# Patient Record
Sex: Male | Born: 1955 | Race: Black or African American | Hispanic: No | Marital: Single | Smoking: Current some day smoker
Health system: Southern US, Community
[De-identification: ages and names within clinical notes are randomized; demographics above are authoritative.]

## PROBLEM LIST (undated history)

## (undated) DIAGNOSIS — F1010 Alcohol abuse, uncomplicated: Secondary | ICD-10-CM

## (undated) DIAGNOSIS — E785 Hyperlipidemia, unspecified: Secondary | ICD-10-CM

## (undated) DIAGNOSIS — I480 Paroxysmal atrial fibrillation: Secondary | ICD-10-CM

## (undated) DIAGNOSIS — I5020 Unspecified systolic (congestive) heart failure: Secondary | ICD-10-CM

## (undated) DIAGNOSIS — Z720 Tobacco use: Secondary | ICD-10-CM

## (undated) DIAGNOSIS — I10 Essential (primary) hypertension: Secondary | ICD-10-CM

## (undated) DIAGNOSIS — I429 Cardiomyopathy, unspecified: Secondary | ICD-10-CM

## (undated) HISTORY — PX: NO PAST SURGERIES: SHX2092

---

## 2018-10-10 ENCOUNTER — Other Ambulatory Visit: Payer: Self-pay

## 2018-10-10 DIAGNOSIS — Z20822 Contact with and (suspected) exposure to covid-19: Secondary | ICD-10-CM

## 2018-10-12 LAB — NOVEL CORONAVIRUS, NAA: SARS-CoV-2, NAA: NOT DETECTED

## 2021-02-04 ENCOUNTER — Inpatient Hospital Stay
Admission: EM | Admit: 2021-02-04 | Discharge: 2021-02-10 | DRG: 871 | Disposition: A | Discharge status: Home / Self Care | Payer: Self-pay | Attending: Internal Medicine | Admitting: Internal Medicine

## 2021-02-04 ENCOUNTER — Inpatient Hospital Stay (HOSPITAL_COMMUNITY)
Admit: 2021-02-04 | Discharge: 2021-02-04 | Disposition: A | Discharge status: Home / Self Care | Payer: Self-pay | Attending: Internal Medicine | Admitting: Internal Medicine

## 2021-02-04 ENCOUNTER — Emergency Department: Payer: Self-pay

## 2021-02-04 ENCOUNTER — Other Ambulatory Visit: Payer: Self-pay

## 2021-02-04 DIAGNOSIS — R609 Edema, unspecified: Secondary | ICD-10-CM | POA: Diagnosis present

## 2021-02-04 DIAGNOSIS — R112 Nausea with vomiting, unspecified: Secondary | ICD-10-CM | POA: Diagnosis present

## 2021-02-04 DIAGNOSIS — L8990 Pressure ulcer of unspecified site, unspecified stage: Secondary | ICD-10-CM | POA: Diagnosis present

## 2021-02-04 DIAGNOSIS — D649 Anemia, unspecified: Secondary | ICD-10-CM | POA: Diagnosis present

## 2021-02-04 DIAGNOSIS — E8721 Acute metabolic acidosis: Secondary | ICD-10-CM | POA: Diagnosis present

## 2021-02-04 DIAGNOSIS — E785 Hyperlipidemia, unspecified: Secondary | ICD-10-CM | POA: Diagnosis present

## 2021-02-04 DIAGNOSIS — A419 Sepsis, unspecified organism: Principal | ICD-10-CM

## 2021-02-04 DIAGNOSIS — D638 Anemia in other chronic diseases classified elsewhere: Secondary | ICD-10-CM | POA: Diagnosis present

## 2021-02-04 DIAGNOSIS — I959 Hypotension, unspecified: Secondary | ICD-10-CM | POA: Diagnosis present

## 2021-02-04 DIAGNOSIS — I480 Paroxysmal atrial fibrillation: Secondary | ICD-10-CM | POA: Diagnosis present

## 2021-02-04 DIAGNOSIS — N529 Male erectile dysfunction, unspecified: Secondary | ICD-10-CM | POA: Diagnosis present

## 2021-02-04 DIAGNOSIS — I444 Left anterior fascicular block: Secondary | ICD-10-CM | POA: Diagnosis present

## 2021-02-04 DIAGNOSIS — Z789 Other specified health status: Secondary | ICD-10-CM

## 2021-02-04 DIAGNOSIS — Z833 Family history of diabetes mellitus: Secondary | ICD-10-CM

## 2021-02-04 DIAGNOSIS — R64 Cachexia: Secondary | ICD-10-CM | POA: Diagnosis not present

## 2021-02-04 DIAGNOSIS — E8729 Other acidosis: Secondary | ICD-10-CM

## 2021-02-04 DIAGNOSIS — L89152 Pressure ulcer of sacral region, stage 2: Secondary | ICD-10-CM | POA: Diagnosis present

## 2021-02-04 DIAGNOSIS — J189 Pneumonia, unspecified organism: Secondary | ICD-10-CM

## 2021-02-04 DIAGNOSIS — F17210 Nicotine dependence, cigarettes, uncomplicated: Secondary | ICD-10-CM | POA: Diagnosis present

## 2021-02-04 DIAGNOSIS — E876 Hypokalemia: Secondary | ICD-10-CM | POA: Diagnosis present

## 2021-02-04 DIAGNOSIS — Z20822 Contact with and (suspected) exposure to covid-19: Secondary | ICD-10-CM | POA: Diagnosis present

## 2021-02-04 DIAGNOSIS — M899 Disorder of bone, unspecified: Secondary | ICD-10-CM | POA: Diagnosis present

## 2021-02-04 DIAGNOSIS — I110 Hypertensive heart disease with heart failure: Secondary | ICD-10-CM | POA: Diagnosis present

## 2021-02-04 DIAGNOSIS — I10 Essential (primary) hypertension: Secondary | ICD-10-CM

## 2021-02-04 DIAGNOSIS — R636 Underweight: Secondary | ICD-10-CM

## 2021-02-04 DIAGNOSIS — R739 Hyperglycemia, unspecified: Secondary | ICD-10-CM | POA: Diagnosis present

## 2021-02-04 DIAGNOSIS — E872 Acidosis, unspecified: Secondary | ICD-10-CM

## 2021-02-04 DIAGNOSIS — I5022 Chronic systolic (congestive) heart failure: Secondary | ICD-10-CM | POA: Diagnosis present

## 2021-02-04 DIAGNOSIS — F1020 Alcohol dependence, uncomplicated: Secondary | ICD-10-CM | POA: Diagnosis present

## 2021-02-04 DIAGNOSIS — J690 Pneumonitis due to inhalation of food and vomit: Secondary | ICD-10-CM | POA: Diagnosis present

## 2021-02-04 DIAGNOSIS — E538 Deficiency of other specified B group vitamins: Secondary | ICD-10-CM | POA: Diagnosis not present

## 2021-02-04 DIAGNOSIS — Z681 Body mass index (BMI) 19 or less, adult: Secondary | ICD-10-CM

## 2021-02-04 DIAGNOSIS — I429 Cardiomyopathy, unspecified: Secondary | ICD-10-CM | POA: Diagnosis present

## 2021-02-04 DIAGNOSIS — R197 Diarrhea, unspecified: Secondary | ICD-10-CM

## 2021-02-04 DIAGNOSIS — R6520 Severe sepsis without septic shock: Secondary | ICD-10-CM

## 2021-02-04 DIAGNOSIS — R531 Weakness: Secondary | ICD-10-CM

## 2021-02-04 DIAGNOSIS — E861 Hypovolemia: Secondary | ICD-10-CM | POA: Diagnosis present

## 2021-02-04 DIAGNOSIS — R7989 Other specified abnormal findings of blood chemistry: Secondary | ICD-10-CM

## 2021-02-04 DIAGNOSIS — E7800 Pure hypercholesterolemia, unspecified: Secondary | ICD-10-CM | POA: Diagnosis present

## 2021-02-04 DIAGNOSIS — I4510 Unspecified right bundle-branch block: Secondary | ICD-10-CM | POA: Diagnosis present

## 2021-02-04 DIAGNOSIS — I4891 Unspecified atrial fibrillation: Secondary | ICD-10-CM

## 2021-02-04 DIAGNOSIS — E43 Unspecified severe protein-calorie malnutrition: Secondary | ICD-10-CM | POA: Diagnosis present

## 2021-02-04 DIAGNOSIS — R000 Tachycardia, unspecified: Secondary | ICD-10-CM | POA: Diagnosis present

## 2021-02-04 HISTORY — DX: Tobacco use: Z72.0

## 2021-02-04 HISTORY — DX: Cardiomyopathy, unspecified: I42.9

## 2021-02-04 HISTORY — DX: Alcohol abuse, uncomplicated: F10.10

## 2021-02-04 HISTORY — DX: Hyperlipidemia, unspecified: E78.5

## 2021-02-04 HISTORY — DX: Essential (primary) hypertension: I10

## 2021-02-04 LAB — ACETAMINOPHEN LEVEL: Acetaminophen (Tylenol), Serum: <10 ug/mL — ABNORMAL LOW (ref 10–30)

## 2021-02-04 LAB — BASIC METABOLIC PANEL
Anion gap: 29 — ABNORMAL HIGH (ref 5–15)
Anion gap: 18 — ABNORMAL HIGH (ref 5–15)
BUN: 9 mg/dL (ref 8–23)
BUN: 7 mg/dL — ABNORMAL LOW (ref 8–23)
CO2: 10 mmol/L — ABNORMAL LOW (ref 22–32)
CO2: 14 mmol/L — ABNORMAL LOW (ref 22–32)
Calcium: 9 mg/dL (ref 8.9–10.3)
Calcium: 8.3 mg/dL — ABNORMAL LOW (ref 8.9–10.3)
Chloride: 105 mmol/L (ref 98–111)
Chloride: 106 mmol/L (ref 98–111)
Creatinine, Ser: 0.94 mg/dL (ref 0.61–1.24)
Creatinine, Ser: 0.97 mg/dL (ref 0.61–1.24)
GFR, Estimated: >60 mL/min (ref >60)
GFR, Estimated: >60 mL/min (ref >60)
Glucose, Bld: 128 mg/dL — ABNORMAL HIGH (ref 70–99)
Glucose, Bld: 288 mg/dL — ABNORMAL HIGH (ref 70–99)
Potassium: 4.2 mmol/L (ref 3.5–5.1)
Potassium: 3.9 mmol/L (ref 3.5–5.1)
Sodium: 144 mmol/L (ref 135–145)
Sodium: 138 mmol/L (ref 135–145)

## 2021-02-04 LAB — CBC
HCT: 32 % — ABNORMAL LOW (ref 39.0–52.0)
Hemoglobin: 9.5 g/dL — ABNORMAL LOW (ref 13.0–17.0)
MCH: 32.1 pg (ref 26.0–34.0)
MCHC: 29.7 g/dL — ABNORMAL LOW (ref 30.0–36.0)
MCV: 108.1 fL — ABNORMAL HIGH (ref 80.0–100.0)
Platelets: 398 10*3/uL (ref 150–400)
RBC: 2.96 MIL/uL — ABNORMAL LOW (ref 4.22–5.81)
RDW: 16.2 % — ABNORMAL HIGH (ref 11.5–15.5)
WBC: 11 10*3/uL — ABNORMAL HIGH (ref 4.0–10.5)
nRBC: 0 % (ref 0.0–0.2)

## 2021-02-04 LAB — URINALYSIS, ROUTINE W REFLEX MICROSCOPIC
Bilirubin Urine: NEGATIVE
Glucose, UA: NEGATIVE mg/dL
Hgb urine dipstick: NEGATIVE
Ketones, ur: 80 mg/dL — AB
Leukocytes,Ua: NEGATIVE
Nitrite: NEGATIVE
Protein, ur: NEGATIVE mg/dL
Specific Gravity, Urine: 1.034 — ABNORMAL HIGH (ref 1.005–1.030)
pH: 6 (ref 5.0–8.0)

## 2021-02-04 LAB — URINE DRUG SCREEN, QUALITATIVE (ARMC ONLY)
Amphetamines, Ur Screen: NOT DETECTED
Barbiturates, Ur Screen: NOT DETECTED
Benzodiazepine, Ur Scrn: NOT DETECTED
Cannabinoid 50 Ng, Ur ~~LOC~~: NOT DETECTED
Cocaine Metabolite,Ur ~~LOC~~: NOT DETECTED
MDMA (Ecstasy)Ur Screen: NOT DETECTED
Methadone Scn, Ur: NOT DETECTED
Opiate, Ur Screen: NOT DETECTED
Phencyclidine (PCP) Ur S: NOT DETECTED
Tricyclic, Ur Screen: NOT DETECTED

## 2021-02-04 LAB — CBC WITH DIFFERENTIAL/PLATELET
Abs Immature Granulocytes: 0.05 10*3/uL (ref 0.00–0.07)
Basophils Absolute: 0 10*3/uL (ref 0.0–0.1)
Basophils Relative: 0 %
Eosinophils Absolute: 0 10*3/uL (ref 0.0–0.5)
Eosinophils Relative: 0 %
HCT: 31.5 % — ABNORMAL LOW (ref 39.0–52.0)
Hemoglobin: 9.7 g/dL — ABNORMAL LOW (ref 13.0–17.0)
Immature Granulocytes: 1 %
Lymphocytes Relative: 5 %
Lymphs Abs: 0.5 10*3/uL — ABNORMAL LOW (ref 0.7–4.0)
MCH: 33.3 pg (ref 26.0–34.0)
MCHC: 30.8 g/dL (ref 30.0–36.0)
MCV: 108.2 fL — ABNORMAL HIGH (ref 80.0–100.0)
Monocytes Absolute: 0.2 10*3/uL (ref 0.1–1.0)
Monocytes Relative: 2 %
Neutro Abs: 9.7 10*3/uL — ABNORMAL HIGH (ref 1.7–7.7)
Neutrophils Relative %: 92 %
Platelets: 409 10*3/uL — ABNORMAL HIGH (ref 150–400)
RBC: 2.91 MIL/uL — ABNORMAL LOW (ref 4.22–5.81)
RDW: 16.3 % — ABNORMAL HIGH (ref 11.5–15.5)
WBC: 10.5 10*3/uL (ref 4.0–10.5)
nRBC: 0 % (ref 0.0–0.2)

## 2021-02-04 LAB — BLOOD GAS, VENOUS
Acid-base deficit: 18.8 mmol/L — ABNORMAL HIGH (ref 0.0–2.0)
Bicarbonate: 9.1 mmol/L — ABNORMAL LOW (ref 20.0–28.0)
O2 Saturation: 72 %
Patient temperature: 37
pCO2, Ven: 28 mmHg — ABNORMAL LOW (ref 44.0–60.0)
pH, Ven: 7.12 — CL (ref 7.250–7.430)
pO2, Ven: 52 mmHg — ABNORMAL HIGH (ref 32.0–45.0)

## 2021-02-04 LAB — IRON AND TIBC
Iron: 110 ug/dL (ref 45–182)
Saturation Ratios: 103 % — ABNORMAL HIGH (ref 17.9–39.5)
TIBC: 106 ug/dL — ABNORMAL LOW (ref 250–450)

## 2021-02-04 LAB — HEPATIC FUNCTION PANEL
ALT: 26 U/L (ref 0–44)
AST: 54 U/L — ABNORMAL HIGH (ref 15–41)
Albumin: 2.7 g/dL — ABNORMAL LOW (ref 3.5–5.0)
Alkaline Phosphatase: 116 U/L (ref 38–126)
Bilirubin, Direct: 0.4 mg/dL — ABNORMAL HIGH (ref 0.0–0.2)
Indirect Bilirubin: 2 mg/dL — ABNORMAL HIGH (ref 0.3–0.9)
Total Bilirubin: 2.4 mg/dL — ABNORMAL HIGH (ref 0.3–1.2)
Total Protein: 8.5 g/dL — ABNORMAL HIGH (ref 6.5–8.1)

## 2021-02-04 LAB — RESP PANEL BY RT-PCR (FLU A&B, COVID) ARPGX2
Influenza A by PCR: NEGATIVE
Influenza B by PCR: NEGATIVE
SARS Coronavirus 2 by RT PCR: NEGATIVE

## 2021-02-04 LAB — LACTIC ACID, PLASMA
Lactic Acid, Venous: 4.9 mmol/L (ref 0.5–1.9)
Lactic Acid, Venous: 6.3 mmol/L (ref 0.5–1.9)
Lactic Acid, Venous: 1.5 mmol/L (ref 0.5–1.9)

## 2021-02-04 LAB — ETHANOL: Alcohol, Ethyl (B): <10 mg/dL (ref <10)

## 2021-02-04 LAB — HIV ANTIBODY (ROUTINE TESTING W REFLEX): HIV Screen 4th Generation wRfx: NONREACTIVE

## 2021-02-04 LAB — SALICYLATE LEVEL: Salicylate Lvl: <7 mg/dL — ABNORMAL LOW (ref 7.0–30.0)

## 2021-02-04 LAB — PHOSPHORUS: Phosphorus: 4.1 mg/dL (ref 2.5–4.6)

## 2021-02-04 LAB — PSA: Prostatic Specific Antigen: 1.72 ng/mL (ref 0.00–4.00)

## 2021-02-04 LAB — BETA-HYDROXYBUTYRIC ACID: Beta-Hydroxybutyric Acid: >8 mmol/L — ABNORMAL HIGH (ref 0.05–0.27)

## 2021-02-04 LAB — CBG MONITORING, ED: Glucose-Capillary: 144 mg/dL — ABNORMAL HIGH (ref 70–99)

## 2021-02-04 LAB — MAGNESIUM: Magnesium: 1.7 mg/dL (ref 1.7–2.4)

## 2021-02-04 LAB — LIPASE, BLOOD: Lipase: 27 U/L (ref 11–51)

## 2021-02-04 MED ORDER — ENOXAPARIN SODIUM 40 MG/0.4ML IJ SOSY
40.0000 mg | PREFILLED_SYRINGE | INTRAMUSCULAR | Status: DC
  Administered 2021-02-04 – 2021-02-05 (×2): 40 mg via SUBCUTANEOUS
  Filled 2021-02-04 (×2): qty 0.4

## 2021-02-04 MED ORDER — THIAMINE HCL 100 MG PO TABS
100.0000 mg | ORAL_TABLET | Freq: Every day | ORAL | Status: DC
  Administered 2021-02-05 – 2021-02-10 (×6): 100 mg via ORAL
  Filled 2021-02-04 (×7): qty 1

## 2021-02-04 MED ORDER — SODIUM CHLORIDE 0.9 % IV SOLN
2.0000 g | INTRAVENOUS | Status: AC
  Administered 2021-02-04 – 2021-02-08 (×5): 2 g via INTRAVENOUS
  Filled 2021-02-04 (×2): qty 20
  Filled 2021-02-04: qty 2
  Filled 2021-02-04: qty 20
  Filled 2021-02-04: qty 2

## 2021-02-04 MED ORDER — FOLIC ACID 1 MG PO TABS
1.0000 mg | ORAL_TABLET | Freq: Every day | ORAL | Status: DC
  Administered 2021-02-04 – 2021-02-10 (×7): 1 mg via ORAL
  Filled 2021-02-04 (×7): qty 1

## 2021-02-04 MED ORDER — THIAMINE HCL 100 MG PO TABS: 100.0000 mg | ORAL_TABLET | Freq: Every day | ORAL | Status: DC

## 2021-02-04 MED ORDER — ACETAMINOPHEN 325 MG PO TABS
650.0000 mg | ORAL_TABLET | Freq: Four times a day (QID) | ORAL | Status: DC | PRN
  Administered 2021-02-05: 09:00:00 650 mg via ORAL
  Filled 2021-02-04 (×2): qty 2

## 2021-02-04 MED ORDER — MAGNESIUM SULFATE 2 GM/50ML IV SOLN
2.0000 g | Freq: Once | INTRAVENOUS | Status: AC
  Administered 2021-02-04: 13:00:00 2 g via INTRAVENOUS
  Filled 2021-02-04: qty 50

## 2021-02-04 MED ORDER — SODIUM CHLORIDE 0.9 % IV SOLN
500.0000 mg | INTRAVENOUS | Status: AC
  Administered 2021-02-04 – 2021-02-08 (×5): 500 mg via INTRAVENOUS
  Filled 2021-02-04: qty 5
  Filled 2021-02-04 (×2): qty 500
  Filled 2021-02-04: qty 5
  Filled 2021-02-04: qty 500

## 2021-02-04 MED ORDER — SODIUM CHLORIDE 0.9 % IV BOLUS
1000.0000 mL | Freq: Once | INTRAVENOUS | Status: AC
  Administered 2021-02-04: 08:00:00 1000 mL via INTRAVENOUS

## 2021-02-04 MED ORDER — LORAZEPAM 2 MG PO TABS: 0.0000 mg | ORAL_TABLET | Freq: Two times a day (BID) | ORAL | Status: DC

## 2021-02-04 MED ORDER — LORAZEPAM 2 MG PO TABS: 0.0000 mg | ORAL_TABLET | Freq: Four times a day (QID) | ORAL | Status: DC

## 2021-02-04 MED ORDER — LORAZEPAM 2 MG/ML IJ SOLN
0.0000 mg | Freq: Four times a day (QID) | INTRAMUSCULAR | Status: DC
  Administered 2021-02-04: 09:00:00 2 mg via INTRAVENOUS
  Filled 2021-02-04: qty 1

## 2021-02-04 MED ORDER — IOHEXOL 300 MG/ML  SOLN
100.0000 mL | Freq: Once | INTRAMUSCULAR | Status: AC | PRN
  Administered 2021-02-04: 09:00:00 100 mL via INTRAVENOUS

## 2021-02-04 MED ORDER — ACETAMINOPHEN 650 MG RE SUPP: 650.0000 mg | Freq: Four times a day (QID) | RECTAL | Status: DC | PRN

## 2021-02-04 MED ORDER — LORAZEPAM 2 MG/ML IJ SOLN: 1.0000 mg | INTRAMUSCULAR | Status: AC | PRN

## 2021-02-04 MED ORDER — LACTATED RINGERS IV BOLUS (SEPSIS): 1000.0000 mL | Freq: Once | INTRAVENOUS | Status: DC

## 2021-02-04 MED ORDER — DILTIAZEM HCL 25 MG/5ML IV SOLN
10.0000 mg | Freq: Once | INTRAVENOUS | Status: AC
  Administered 2021-02-04: 11:00:00 10 mg via INTRAVENOUS
  Filled 2021-02-04: qty 5

## 2021-02-04 MED ORDER — LACTATED RINGERS IV SOLN: INTRAVENOUS | Status: DC

## 2021-02-04 MED ORDER — LORAZEPAM 1 MG PO TABS: 1.0000 mg | ORAL_TABLET | ORAL | Status: DC | PRN

## 2021-02-04 MED ORDER — LACTATED RINGERS IV BOLUS
1000.0000 mL | Freq: Once | INTRAVENOUS | Status: AC
  Administered 2021-02-04: 12:00:00 1000 mL via INTRAVENOUS

## 2021-02-04 MED ORDER — METOPROLOL TARTRATE 25 MG PO TABS
12.5000 mg | ORAL_TABLET | Freq: Two times a day (BID) | ORAL | Status: DC
  Administered 2021-02-04 – 2021-02-07 (×6): 12.5 mg via ORAL
  Filled 2021-02-04 (×6): qty 1

## 2021-02-04 MED ORDER — LORAZEPAM 2 MG/ML IJ SOLN: 1.0000 mg | INTRAMUSCULAR | Status: DC | PRN

## 2021-02-04 MED ORDER — ADULT MULTIVITAMIN W/MINERALS CH
1.0000 | ORAL_TABLET | Freq: Every day | ORAL | Status: DC
  Administered 2021-02-04 – 2021-02-10 (×7): 1 via ORAL
  Filled 2021-02-04 (×8): qty 1

## 2021-02-04 MED ORDER — DILTIAZEM HCL-DEXTROSE 125-5 MG/125ML-% IV SOLN (PREMIX)
5.0000 mg/h | INTRAVENOUS | Status: DC
Start: 2021-02-04 — End: 2021-02-04
  Filled 2021-02-04: qty 125

## 2021-02-04 MED ORDER — LORAZEPAM 2 MG/ML IJ SOLN: 0.0000 mg | Freq: Two times a day (BID) | INTRAMUSCULAR | Status: DC

## 2021-02-04 MED ORDER — ONDANSETRON HCL 4 MG PO TABS: 4.0000 mg | ORAL_TABLET | Freq: Four times a day (QID) | ORAL | Status: DC | PRN

## 2021-02-04 MED ORDER — THIAMINE HCL 100 MG/ML IJ SOLN
100.0000 mg | Freq: Every day | INTRAMUSCULAR | Status: DC
  Administered 2021-02-04: 11:00:00 100 mg via INTRAVENOUS

## 2021-02-04 MED ORDER — ONDANSETRON HCL 4 MG/2ML IJ SOLN
4.0000 mg | Freq: Once | INTRAMUSCULAR | Status: AC
  Administered 2021-02-04: 09:00:00 4 mg via INTRAVENOUS
  Filled 2021-02-04: qty 2

## 2021-02-04 MED ORDER — METOPROLOL TARTRATE 25 MG PO TABS
25.0000 mg | ORAL_TABLET | Freq: Two times a day (BID) | ORAL | Status: DC
  Administered 2021-02-04: 13:00:00 25 mg via ORAL
  Filled 2021-02-04: qty 1

## 2021-02-04 MED ORDER — LORAZEPAM 2 MG/ML IJ SOLN: 0.0000 mg | Freq: Four times a day (QID) | INTRAMUSCULAR | Status: DC

## 2021-02-04 MED ORDER — ONDANSETRON HCL 4 MG/2ML IJ SOLN
4.0000 mg | Freq: Four times a day (QID) | INTRAMUSCULAR | Status: DC | PRN
  Administered 2021-02-05: 05:00:00 4 mg via INTRAVENOUS
  Filled 2021-02-04: qty 2

## 2021-02-04 MED ORDER — LORAZEPAM 1 MG PO TABS: 1.0000 mg | ORAL_TABLET | ORAL | Status: AC | PRN

## 2021-02-04 MED ORDER — THIAMINE HCL 100 MG/ML IJ SOLN
100.0000 mg | Freq: Every day | INTRAMUSCULAR | Status: DC
  Filled 2021-02-04: qty 2

## 2021-02-04 MED ORDER — DEXTROSE-NACL 5-0.9 % IV SOLN: INTRAVENOUS | Status: DC

## 2021-02-04 NOTE — ED Notes (Signed)
Lab at bedside collecting lactic and Blood Cultures

## 2021-02-04 NOTE — Progress Notes (Addendum)
Notified provider and bedside nurse of need to order blood cultures.   Only one blood culture ordered. MD responded will order a second. However pt is a difficult stick and was barely able to get first blood culture per bedside RN

## 2021-02-04 NOTE — Progress Notes (Signed)
Elink following code sepsis °

## 2021-02-04 NOTE — ED Notes (Signed)
This RN called RT at this time to notify of VBG in lab

## 2021-02-04 NOTE — Progress Notes (Signed)
CODE SEPSIS - PHARMACY COMMUNICATION  **Broad Spectrum Antibiotics should be administered within 1 hour of Sepsis diagnosis**  Time Code Sepsis Called/Page Received: 1037  Antibiotics Ordered: ceftriaxone and azithromycin  Time of 1st antibiotic administration: 1100   Raiford Noble ,PharmD Clinical Pharmacist  02/04/2021  10:49 AM \

## 2021-02-04 NOTE — ED Triage Notes (Signed)
Presents via EMS from home with some weakness  he denies any n/v or fever  states he may have pneumonia or going thur DT's  states he has been on a bender for several days  last drink was 2 days ago

## 2021-02-04 NOTE — ED Notes (Signed)
This RN entered room d/t pt calling out. This pt asked if they were dead, this RN informed them they were not dead. This pt stated they felt dead and did not feel better yet after the medication, this RN explained to this pt that it would take time for them to feel better and that it would not happen immediately.

## 2021-02-04 NOTE — ED Notes (Signed)
This RN called lab at this time to request they draw this pt's BMP and lactic acid, pt a difficult stick and lines do not draw back. They stated they would send someone shortly.

## 2021-02-04 NOTE — ED Notes (Signed)
Pt shown where callbell is again, pt given water to drink and covered back up with blankets. Bed in lowest position, call bell within reach, side rails up.

## 2021-02-04 NOTE — ED Notes (Signed)
This RN called lab at this time to request they send someone to get this pt's 1300 lactic. They stated they would send someone.

## 2021-02-04 NOTE — ED Notes (Signed)
Warmed fluids administered at this time

## 2021-02-04 NOTE — ED Notes (Signed)
This RN went into this pt's room d/t them calling out, pt informed this RN that they still felt bad and asked this RN why the medication hasn't made them better yet. This RN explained to this pt that it would take time for them to feel better since they were very sick, this pt did not seem to understand, this RN again explained that the medication doesn't immediately make them feel better and that it would take time. Pt stated "I want to feel better now". This RN expressed understanding and asked if there was anything else they could do. Pt stated no.

## 2021-02-04 NOTE — ED Notes (Signed)
ED TO INPATIENT HANDOFF REPORT  ED Nurse Name and Phone #:   S Name/Age/Gender Charles Schmitt. 65 y.o. male Room/Bed: ED07A/ED07A  Code Status   Code Status: Full Code  Home/SNF/Other Home Patient oriented to: self, place, time, and situation Is this baseline? 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.29]  Triage Note Presents via EMS from home with some weakness  he denies any n/v or fever  states he may have pneumonia or going thur DT's  states he has been on a bender for several days  last drink was 2 days ago   Allergies No Known Allergies  Level of Care/Admitting Diagnosis ED Disposition     ED Disposition  Admit   Condition  --   Comment  Hospital Area: [facility] [[id number]]  Level of Care: Progressive [102]  Admit to Progressive based on following criteria: CARDIOVASCULAR & THORACIC of moderate stability with acute coronary syndrome symptoms/low risk myocardial infarction/hypertensive urgency/arrhythmias/heart failure potentially compromising stability and stable post cardiovascular intervention patients.  Covid Evaluation: Confirmed COVID Negative  Diagnosis: Alcoholic ketoacidosis [172225]  Admitting Physician: Alford Highland [[id number]]  Attending Physician: Alford Highland [[id number]]  Estimated length of stay: past midnight tomorrow  Certification:: I certify this patient will need inpatient services for at least 2 midnights          B Medical/Surgery History Past Medical History:  Diagnosis Date   Hyperlipidemia    Hypertension    Past Surgical History:  Procedure Laterality Date   NO PAST SURGERIES       A IV Location/Drains/Wounds Patient Lines/Drains/Airways Status     Active Line/Drains/Airways     Name Placement date Placement time Site Days   Peripheral IV 02/04/21 20 G Left Antecubital 02/04/21  0825  Antecubital  less than 1   Peripheral IV 02/04/21 22 G 1.25"  Anterior;Distal;Right;Upper Arm 02/04/21  1113  Arm  less than 1   Peripheral IV 02/04/21 22 G 1.25" Anterior;Left Forearm 02/04/21  1130  Forearm  less than 1            Intake/Output Last 24 hours No intake or output data in the 24 hours ending 02/04/21 2142  Labs/Imaging Results for orders placed or performed during the hospital encounter of 02/04/21 (from the past 48 hour(s))  Basic metabolic panel     Status: Abnormal   Collection Time: 02/04/21  7:31 AM  Result Value Ref Range   Sodium 144 135 - 145 mmol/L    Comment: ELECTROLYTES REPEATED TO VERIFY DAS   Potassium 4.2 3.5 - 5.1 mmol/L   Chloride 105 98 - 111 mmol/L   CO2 10 (L) 22 - 32 mmol/L   Glucose, Bld 128 (H) 70 - 99 mg/dL    Comment: Glucose reference range applies only to samples taken after fasting for at least 8 hours.   BUN 9 8 - 23 mg/dL   Creatinine, Ser 8.11 0.61 - 1.24 mg/dL   Calcium 9.0 8.9 - 91.4 mg/dL   GFR, Estimated >78 >29 mL/min    Comment: (NOTE) Calculated using the CKD-EPI Creatinine Equation (2021)    Anion gap 29 (H) 5 - 15    Comment: Performed at [facility], [address] [place], [place] [address]  CBC     Status: Abnormal   Collection Time: 02/04/21  7:31 AM  Result Value Ref Range   WBC 11.0 (H) 4.0 - 10.5 K/uL   RBC 2.96 (L) 4.22 -  5.81 MIL/uL   Hemoglobin 9.5 (L) 13.0 - 17.0 g/dL   HCT 35.5 (L) 97.4 - 16.3 %   MCV 108.1 (H) 80.0 - 100.0 fL   MCH 32.1 26.0 - 34.0 pg   MCHC 29.7 (L) 30.0 - 36.0 g/dL   RDW 84.5 (H) 36.4 - 68.0 %   Platelets 398 150 - 400 K/uL   nRBC 0.0 0.0 - 0.2 %    Comment: Performed at [facility], [address] [place], [place] [address]  Ethanol     Status: None   Collection Time: 02/04/21  7:31 AM  Result Value Ref Range   Alcohol, Ethyl (B) <10 <10 mg/dL    Comment: (NOTE) Lowest detectable limit for serum alcohol is 10 mg/dL.  For medical purposes only. Performed at [facility], [address]  [place], [place] [address]   Hepatic function panel     Status: Abnormal   Collection Time: 02/04/21  7:31 AM  Result Value Ref Range   Total Protein 8.5 (H) 6.5 - 8.1 g/dL   Albumin 2.7 (L) 3.5 - 5.0 g/dL   AST 54 (H) 15 - 41 U/L   ALT 26 0 - 44 U/L   Alkaline Phosphatase 116 38 - 126 U/L   Total Bilirubin 2.4 (H) 0.3 - 1.2 mg/dL   Bilirubin, Direct 0.4 (H) 0.0 - 0.2 mg/dL   Indirect Bilirubin 2.0 (H) 0.3 - 0.9 mg/dL    Comment: Performed at [facility], [address] [place], [place] [address]  Lipase, blood     Status: None   Collection Time: 02/04/21  7:31 AM  Result Value Ref Range   Lipase 27 11 - 51 U/L    Comment: Performed at [facility], [address] [place], [place] [address]  Iron and TIBC     Status: Abnormal   Collection Time: 02/04/21  7:31 AM  Result Value Ref Range   Iron 110 45 - 182 ug/dL   TIBC 694 (L) 503 - 888 ug/dL   Saturation Ratios 280 (H) 17.9 - 39.5 %   UIBC NOT CALCULATED ug/dL    Comment: Performed at [facility], [address] [place], [place] [address]  CBC with Differential/Platelet     Status: Abnormal   Collection Time: 02/04/21  7:41 AM  Result Value Ref Range   WBC 10.5 4.0 - 10.5 K/uL   RBC 2.91 (L) 4.22 - 5.81 MIL/uL   Hemoglobin 9.7 (L) 13.0 - 17.0 g/dL   HCT 79.1 (L) 50.5 - 69.7 %   MCV 108.2 (H) 80.0 - 100.0 fL   MCH 33.3 26.0 - 34.0 pg   MCHC 30.8 30.0 - 36.0 g/dL   RDW 94.8 (H) 01.6 - 55.3 %   Platelets 409 (H) 150 - 400 K/uL   nRBC 0.0 0.0 - 0.2 %   Neutrophils Relative % 92 %   Neutro Abs 9.7 (H) 1.7 - 7.7 K/uL   Lymphocytes Relative 5 %   Lymphs Abs 0.5 (L) 0.7 - 4.0 K/uL   Monocytes Relative 2 %   Monocytes Absolute 0.2 0.1 - 1.0 K/uL   Eosinophils Relative 0 %   Eosinophils Absolute 0.0 0.0 - 0.5 K/uL   Basophils Relative 0 %   Basophils Absolute 0.0 0.0 - 0.1 K/uL   Immature Granulocytes 1 %   Abs Immature Granulocytes 0.05 0.00 - 0.07 K/uL    Comment: Performed at [facility], [address] [place], [place]  [address]  CBG monitoring, ED     Status: Abnormal   Collection Time: 02/04/21  8:04 AM  Result Value Ref Range   Glucose-Capillary 144 (H) 70 - 99 mg/dL    Comment: Glucose reference range applies only to samples taken after fasting for at least 8 hours.  Resp Panel by RT-PCR (Flu A&B, Covid) Nasopharyngeal Swab     Status: None   Collection Time: 02/04/21  8:30 AM   Specimen: Nasopharyngeal Swab; Nasopharyngeal(NP) swabs in vial transport medium  Result Value Ref Range   SARS Coronavirus 2 by RT PCR NEGATIVE NEGATIVE    Comment: (NOTE) SARS-CoV-2 target nucleic acids are NOT DETECTED.  The SARS-CoV-2 RNA is generally detectable in upper respiratory specimens during the acute phase of infection. The lowest concentration of SARS-CoV-2 viral copies this assay can detect is 138 copies/mL. A negative result does not preclude SARS-Cov-2 infection and should not be used as the sole basis for treatment or other patient management decisions. A negative result may occur with  improper specimen collection/handling, submission of specimen other than nasopharyngeal swab, presence of viral mutation(s) within the areas targeted by this assay, and inadequate number of viral copies(<138 copies/mL). A negative result must be combined with clinical observations, patient history, and epidemiological information. The expected result is Negative.  Fact Sheet for Patients:  BloggerCourse.com  Fact Sheet for Healthcare Providers:  SeriousBroker.it  This test is no t yet approved or cleared by the [place] FDA and  has been authorized for detection and/or diagnosis of SARS-CoV-2 by FDA under an Emergency Use Authorization (EUA). This EUA will remain  in effect (meaning this test can be used) for the duration of the COVID-19 declaration under Section 564(b)(1) of the Act, 21 U.S.C.section  360bbb-3(b)(1), unless the authorization is terminated  or revoked sooner.       Influenza A by PCR NEGATIVE NEGATIVE   Influenza B by PCR NEGATIVE NEGATIVE    Comment: (NOTE) The Xpert Xpress SARS-CoV-2/FLU/RSV plus assay is intended as an aid in the diagnosis of influenza from Nasopharyngeal swab specimens and should not be used as a sole basis for treatment. Nasal washings and aspirates are unacceptable for Xpert Xpress SARS-CoV-2/FLU/RSV testing.  Fact Sheet for Patients: BloggerCourse.com  Fact Sheet for Healthcare Providers: SeriousBroker.it  This test is not yet approved or cleared by the [place] FDA and has been authorized for detection and/or diagnosis of SARS-CoV-2 by FDA under an Emergency Use Authorization (EUA). This EUA will remain in effect (meaning this test can be used) for the duration of the COVID-19 declaration under Section 564(b)(1) of the Act, 21 U.S.C. section 360bbb-3(b)(1), unless the authorization is terminated or revoked.  Performed at [facility], [address] [place], [place] [address]   Blood gas, venous     Status: Abnormal   Collection Time: 02/04/21  9:19 AM  Result Value Ref Range   pH, Ven 7.12 (LL) 7.250 - 7.430    Comment: CRITICAL RESULT CALLED TO, READ BACK BY AND VERIFIED WITH: KARA RN AT 1123 ON 02/04/21 BL    pCO2, Ven 28 (L) 44.0 - 60.0 mmHg   pO2, Ven 52.0 (H) 32.0 - 45.0 mmHg   Bicarbonate 9.1 (L) 20.0 - 28.0 mmol/L   Acid-base deficit 18.8 (H) 0.0 - 2.0 mmol/L   O2 Saturation 72.0 %   Patient temperature 37.0    Collection site VENOUS    Sample type VENOUS     Comment: Performed at [facility], [address] [place], [place] [address]  Urinalysis, Routine w reflex microscopic Urine, Clean Catch     Status: Abnormal   Collection Time: 02/04/21  9:37 AM  Result Value Ref Range   Color, Urine YELLOW (A) YELLOW   APPearance CLEAR (A) CLEAR    Specific Gravity, Urine 1.034 (H) 1.005 - 1.030   pH 6.0 5.0 - 8.0   Glucose, UA NEGATIVE NEGATIVE mg/dL   Hgb urine dipstick NEGATIVE NEGATIVE   Bilirubin Urine NEGATIVE NEGATIVE   Ketones, ur 80 (A) NEGATIVE mg/dL   Protein, ur NEGATIVE NEGATIVE mg/dL   Nitrite NEGATIVE NEGATIVE   Leukocytes,Ua NEGATIVE NEGATIVE    Comment: Performed at [facility], [address] [place], [place] [address]  Lactic acid, plasma     Status: Abnormal   Collection Time: 02/04/21  9:37 AM  Result Value Ref Range   Lactic Acid, Venous 4.9 (HH) 0.5 - 1.9 mmol/L    Comment: CRITICAL RESULT CALLED TO, READ BACK BY AND VERIFIED WITH KARA BRIDGES AT 1027 02/04/21 DAS Performed at [facility], [address], [place], [place] [address]   Acetaminophen level     Status: Abnormal   Collection Time: 02/04/21  9:37 AM  Result Value Ref Range   Acetaminophen (Tylenol), Serum <10 (L) 10 - 30 ug/mL    Comment: (NOTE) Therapeutic concentrations vary significantly. A range of 10-30 ug/mL  may be an effective concentration for many patients. However, some  are best treated at concentrations outside of this range. Acetaminophen concentrations >150 ug/mL at 4 hours after ingestion  and >50 ug/mL at 12 hours after ingestion are often associated with  toxic reactions.  Performed at [facility], [address] [place], [place] [address]   Salicylate level     Status: Abnormal   Collection Time: 02/04/21  9:37 AM  Result Value Ref Range   Salicylate Lvl <7.0 (L) 7.0 - 30.0 mg/dL    Comment: Performed at [facility], [address] [place], [place] [address]  Beta-hydroxybutyric acid     Status: Abnormal   Collection Time: 02/04/21  9:37 AM  Result Value Ref Range   Beta-Hydroxybutyric Acid >8.00 (H) 0.05 - 0.27 mmol/L    Comment: RESULT CONFIRMED BY MANUAL DILUTION DAS Performed at [facility], [address] [place], [place] [address]   Magnesium      Status: None   Collection Time: 02/04/21  9:37 AM  Result Value Ref Range   Magnesium 1.7 1.7 - 2.4 mg/dL    Comment: Performed at [facility], [address], [place], [place] [address]  Phosphorus     Status: None   Collection Time: 02/04/21  9:37 AM  Result Value Ref Range   Phosphorus 4.1 2.5 - 4.6 mg/dL    Comment: Performed at [facility], [address] [place], [place] [address]  Lactic acid, plasma     Status: Abnormal   Collection Time: 02/04/21  1:04 PM  Result Value Ref Range   Lactic Acid, Venous 6.3 (HH) 0.5 - 1.9 mmol/L    Comment: CRITICAL VALUE NOTED. VALUE IS CONSISTENT WITH PREVIOUSLY REPORTED/CALLED VALUE KLW Performed at [facility], [address] [place] [place], [place] [address]   Basic metabolic panel     Status: Abnormal   Collection Time: 02/04/21  5:12 PM  Result Value Ref Range   Sodium 138 135 - 145 mmol/L   Potassium 3.9 3.5 - 5.1 mmol/L   Chloride 106 98 -  111 mmol/L   CO2 14 (L) 22 - 32 mmol/L   Glucose, Bld 288 (H) 70 - 99 mg/dL    Comment: Glucose reference range applies only to samples taken after fasting for at least 8 hours.   BUN 7 (L) 8 - 23 mg/dL   Creatinine, Ser 0.98 0.61 - 1.24 mg/dL   Calcium 8.3 (L) 8.9 - 10.3 mg/dL   GFR, Estimated >11 >91 mL/min    Comment: (NOTE) Calculated using the CKD-EPI Creatinine Equation (2021) CORRECTED ON 12/27 AT 1805: PREVIOUSLY REPORTED AS >60    Anion gap 18 (H) 5 - 15    Comment: Performed at [facility], [address], [place], [place] [address]  Lactic acid, plasma     Status: None   Collection Time: 02/04/21  5:12 PM  Result Value Ref Range   Lactic Acid, Venous 1.5 0.5 - 1.9 mmol/L    Comment: Performed at [facility], [address] [place], [place] [address]   CT ABDOMEN PELVIS W CONTRAST  Result Date: 02/04/2021 CLINICAL DATA:  Weakness, possible pneumonia EXAM: CT ABDOMEN AND PELVIS WITH CONTRAST TECHNIQUE: Multidetector CT imaging of  the abdomen and pelvis was performed using the standard protocol following bolus administration of intravenous contrast. CONTRAST:  OMNIPAQUE IOHEXOL 300 MG/ML  SOLN COMPARISON:  None. FINDINGS: Lower chest: There are patchy opacities in the bilateral lung bases, right worse than left. The imaged heart is unremarkable. Hepatobiliary: The liver is diffusely hypoattenuating consistent with marked fatty infiltration. There are no focal lesions. The gallbladder is unremarkable. There is no biliary ductal dilatation. Pancreas: Unremarkable. Spleen: Unremarkable. Adrenals/Urinary Tract: Adrenals are unremarkable. The kidneys are unremarkable, with no focal lesion, stone, hydronephrosis, or hydroureter. The bladder is unremarkable. Stomach/Bowel: The stomach is unremarkable. There is no evidence of bowel obstruction. There is no abnormal bowel wall thickening or inflammatory change. There is colonic diverticulosis without evidence of acute diverticulitis. The appendix is normal. Vascular/Lymphatic: There is scattered calcified atherosclerotic plaque throughout the nonaneurysmal abdominal aorta. The major branch vessels are patent. The main portal and splenic veins are patent. There is no abdominal or pelvic lymphadenopathy. Reproductive: Prostate is enlarged measuring up to 5.3 cm transverse. The seminal vesicles are unremarkable. Other: There is no ascites or free air. Musculoskeletal: There are subacute appearing fractures of the right sixth rib laterally and twelfth rib posteriorly. There is a 7 mm sclerotic lesion in the right iliac bone adjacent to the SI joint with a thin rim of surrounding lucency. IMPRESSION: 1. Patchy opacities in the lung bases, right more than left, may reflect infection or aspiration. 2. No acute findings in the abdomen or pelvis. 3. Subacute appearing fractures of the right sixth and twelfth ribs. 4. Severe fatty infiltration of the liver. 5. Diverticulosis without evidence of acute  diverticulitis. 6. Prostatomegaly. 7. Indeterminate sclerotic lesion in the right iliac bone measuring approximately 7 mm. Correlation with any prior imaging would be helpful, if available, and correlate with PSA. In the absence of prior imaging, six-month follow-up CT could be considered to ensure stability. 8.  Aortic Atherosclerosis (ICD10-I70.0). Electronically Signed   By: Lesia Hausen M.D.   On: 02/04/2021 09:49   DG Chest Portable 1 View  Result Date: 02/04/2021 CLINICAL DATA:  Weakness EXAM: PORTABLE CHEST 1 VIEW COMPARISON:  None. FINDINGS: The cardiomediastinal silhouette is normal. There is no focal consolidation or pulmonary edema. There is no pleural effusion or pneumothorax. There is no acute osseous abnormality. IMPRESSION: No radiographic evidence  of acute cardiopulmonary process. Electronically Signed   By: Lesia Hausen M.D.   On: 02/04/2021 08:30    Pending Labs Unresulted Labs (From admission, onward)     Start     Ordered   02/11/21 0500  Creatinine, serum  (enoxaparin (LOVENOX)    CrCl >/= 30 ml/min)  Weekly,   STAT     Comments: while on enoxaparin therapy    02/04/21 1205   02/05/21 0500  Lactic acid, plasma  Tomorrow morning,   STAT       Question:  Release to patient  Answer:  Immediate   02/04/21 1206   02/05/21 0500  Ferritin  Tomorrow morning,   STAT       Question:  Release to patient  Answer:  Immediate   02/04/21 1207   02/05/21 0500  Vitamin B12  Tomorrow morning,   STAT       Question:  Release to patient  Answer:  Immediate   02/04/21 1207   02/05/21 0500  CBC  Tomorrow morning,   STAT       Question:  Release to patient  Answer:  Immediate   02/04/21 1207   02/05/21 0500  Comprehensive metabolic panel  Tomorrow morning,   STAT       Question:  Release to patient  Answer:  Immediate   02/04/21 1207   02/04/21 2029  Urine Drug Screen, Qualitative ([facility] only)  Add-on,   AD       Question:  Release to patient  Answer:  Immediate   02/04/21 2028    02/04/21 1930  HIV Antibody (routine testing w rflx)  Once,   AD        02/04/21 1930   02/04/21 1211  PSA  Add-on,   AD       Question:  Release to patient  Answer:  Immediate   02/04/21 1210   02/04/21 1211  Gastrointestinal Panel by PCR , Stool  (Gastrointestinal Panel by PCR, Stool                                                                                                                                                     **Does Not include CLOSTRIDIUM DIFFICILE testing. **If CDIFF testing is needed, place order from the "C Difficile Testing" order set.**)  Once,   STAT       Question Answer Comment  Patient immune status Normal   Release to patient Immediate      02/04/21 1210   02/04/21 1211  C Difficile Quick Screen w PCR reflex  (C Difficile quick screen w PCR reflex panel )  Once, for 24 hours,   STAT       References:    CDiff Information Tool   02/04/21 1210   02/04/21 1142  Culture, blood (routine x 2)  BLOOD CULTURE X 2,   STAT        02/04/21 1142   02/04/21 1037  Culture, blood (single)  (Undifferentiated -> Now sepsis confirmed (treatment and sepsis specific nursing orders))  ONCE - STAT,   STAT        02/04/21 1037   02/04/21 0924  Volatiles,Blood (acetone,ethanol,isoprop,methanol)  Once,   STAT        02/04/21 0923            Vitals/Pain Today's Vitals   02/04/21 1636 02/04/21 1800 02/04/21 1830 02/04/21 1900  BP: (!) 122/94 (!) 130/95 (!) 127/104 122/85  Pulse: 88 80 87 75  Resp:  Temp:      TempSrc:      SpO2:  98% 100% 100%  Weight:      Height:        Isolation Precautions Enteric precautions (UV disinfection)  Medications Medications  thiamine tablet 100 mg (100 mg Oral Not Given 02/04/21 1135)    Or  thiamine (B-1) injection 100 mg ( Intravenous See Alternative 02/04/21 1135)  cefTRIAXone (ROCEPHIN) 2 g in sodium chloride 0.9 % 100 mL IVPB (0 g Intravenous Stopped 02/04/21 1206)  azithromycin (ZITHROMAX) 500 mg in sodium chloride  0.9 % 250 mL IVPB (0 mg Intravenous Stopped 02/04/21 1304)  folic acid (FOLVITE) tablet 1 mg (1 mg Oral Given 02/04/21 1143)  multivitamin with minerals tablet 1 tablet (1 tablet Oral Given 02/04/21 1143)  LORazepam (ATIVAN) tablet 1-4 mg (has no administration in time range)    Or  LORazepam (ATIVAN) injection 1-4 mg (has no administration in time range)  dextrose 5 %-0.9 % sodium chloride infusion ( Intravenous Rate/Dose Change 02/04/21 1849)  enoxaparin (LOVENOX) injection 40 mg (40 mg Subcutaneous Given 02/04/21 1301)  acetaminophen (TYLENOL) tablet 650 mg (has no administration in time range)    Or  acetaminophen (TYLENOL) suppository 650 mg (has no administration in time range)  ondansetron (ZOFRAN) tablet 4 mg (has no administration in time range)    Or  ondansetron (ZOFRAN) injection 4 mg (has no administration in time range)  metoprolol tartrate (LOPRESSOR) tablet 12.5 mg (has no administration in time range)  sodium chloride 0.9 % bolus 1,000 mL (0 mLs Intravenous Stopped 02/04/21 1034)  ondansetron (ZOFRAN) injection 4 mg (4 mg Intravenous Given 02/04/21 0840)  iohexol (OMNIPAQUE) 300 MG/ML solution 100 mL (100 mLs Intravenous Contrast Given 02/04/21 0915)  lactated ringers bolus 1,000 mL (0 mLs Intravenous Stopped 02/04/21 1304)  diltiazem (CARDIZEM) injection 10 mg (10 mg Intravenous Given 02/04/21 1113)  magnesium sulfate IVPB 2 g 50 mL (0 g Intravenous Stopped 02/04/21 1413)    Mobility walks with person assist High fall risk   Focused Assessments    R Recommendations: See Admitting Provider Note  Report given to:   Additional Notes:

## 2021-02-04 NOTE — ED Notes (Signed)
This RN went into this pt's room d/t pt calling out. Pt asked if medication we have been administering is expired because they do not feel better yet. This RN again explained that this pt's condition is fairly bad and it will take a while for them to begin feeling better. Pt stated for this RN to hurry up with making them feel better. This RN explained that was not possible. Bed in lowest position, call bell within reach, side rails up.

## 2021-02-04 NOTE — H&P (Addendum)
[facility] [place] [facility]   PATIENT NAME: Charles Schmitt    MR#:  [id number]  DATE OF BIRTH:  1955-08-24  DATE OF ADMISSION:  02/04/2021  PRIMARY CARE PHYSICIAN: Pcp, No   REQUESTING/REFERRING PHYSICIAN: Dr. Dorothea Glassman  CHIEF COMPLAINT:   Chief Complaint  Patient presents with   Weakness    HISTORY OF PRESENT ILLNESS:  Charles Schmitt  is a 65 y.o. male with a known history of hypertension hyperlipidemia and alcohol abuse presents with not feeling well for the past 4 to 7 days.  He has been feeling weak.  He has been short of breath.  He has been unable to eat with nausea and vomiting.  Some diarrhea.  No blood in the bowel movements.  No abdominal pain.  He feels like he is going to pass out.  He was found to be in alcoholic ketoacidosis and lactic acidosis.  Hospitalist services were contacted for further evaluation.  ER physician felt that he was in brief atrial fibrillation but now in sinus tachycardia.  PAST MEDICAL HISTORY:   Past Medical History:  Diagnosis Date   Hyperlipidemia    Hypertension     PAST SURGICAL HISTORY:   Past Surgical History:  Procedure Laterality Date   NO PAST SURGERIES      SOCIAL HISTORY:   Social History   Tobacco Use   Smoking status: Some Days    Types: Cigarettes   Smokeless tobacco: Never  Substance Use Topics   Alcohol use: Yes    Comment: 1/5 per day    FAMILY HISTORY:   Family History  Problem Relation Age of Onset   Cancer Father    Diabetes Father     DRUG ALLERGIES:  No Known Allergies  REVIEW OF SYSTEMS:  CONSTITUTIONAL: No fever.  Positive for chills and sweats.  Positive for weight loss 30 to 40 pounds over the last 6 months EYES: No blurred or double vision.  Wears glasses. EARS, NOSE, AND THROAT: No tinnitus or ear pain. No sore throat.  Decreased hearing.  Positive runny nose RESPIRATORY: Positive for cough and shortness of breath, no wheezing or hemoptysis.  CARDIOVASCULAR:  No chest pain, orthopnea, edema.  GASTROINTESTINAL: Positive for nausea, vomiting, and diarrhea.  No abdominal pain. No blood in bowel movements GENITOURINARY: No dysuria, hematuria.  ENDOCRINE: No polyuria, nocturia,  HEMATOLOGY: No anemia, easy bruising or bleeding SKIN: No rash or lesion. MUSCULOSKELETAL: No joint pain or arthritis.   NEUROLOGIC: Feels like he is going to pass PSYCHIATRY: History of depression.   MEDICATIONS AT HOME:   Prior to Admission medications   Not on File   Patient states that he takes a blood pressure medication and cholesterol medication  VITAL SIGNS:  Blood pressure 122/90, pulse (!) 118, temperature 97.9 F (36.6 C), temperature source Oral, resp. rate 20, height 6\' 4"  (1.93 m), weight 68 kg, SpO2 99 %.  PHYSICAL EXAMINATION:  GENERAL:  65 y.o.-year-old patient lying in the bed with no acute distress.  EYES: Pupils equal, round, reactive to light and accommodation. No scleral icterus. Extraocular muscles intact.  HEENT: Head atraumatic, normocephalic. Oropharynx and nasopharynx clear.  NECK:  Supple, no jugular venous distention. No thyroid enlargement, no tenderness.  LUNGS: Normal breath sounds bilaterally, no wheezing, rales,rhonchi or crepitation. No use of accessory muscles of respiration.  CARDIOVASCULAR: S1, S2 tachycardic. No murmurs, rubs, or gallops.  ABDOMEN: Soft, nontender, nondistended. Bowel sounds present. No organomegaly or mass.  EXTREMITIES: No pedal edema, cyanosis, or  clubbing.  NEUROLOGIC: Cranial nerves II through XII are intact. Muscle strength 5/5 in all extremities. Sensation intact. Gait not checked.  PSYCHIATRIC: The patient is alert and answers a few questions.  SKIN: Stage I decubiti left sacral area, stage II right sacral area  LABORATORY PANEL:   CBC Recent Labs  Lab 02/04/21 0741  WBC 10.5  HGB 9.7*  HCT 31.5*  PLT 409*    ------------------------------------------------------------------------------------------------------------------  Chemistries  Recent Labs  Lab 02/04/21 0731  NA 144  K 4.2  CL 105  CO2 10*  GLUCOSE 128*  BUN 9  CREATININE 0.94  CALCIUM 9.0  AST 54*  ALT 26  ALKPHOS 116  BILITOT 2.4*   ------------------------------------------------------------------------------------------------------------------    RADIOLOGY:  CT ABDOMEN PELVIS W CONTRAST  Result Date: 02/04/2021 CLINICAL DATA:  Weakness, possible pneumonia EXAM: CT ABDOMEN AND PELVIS WITH CONTRAST TECHNIQUE: Multidetector CT imaging of the abdomen and pelvis was performed using the standard protocol following bolus administration of intravenous contrast. CONTRAST:  131mL OMNIPAQUE IOHEXOL 300 MG/ML  SOLN COMPARISON:  None. FINDINGS: Lower chest: There are patchy opacities in the bilateral lung bases, right worse than left. The imaged heart is unremarkable. Hepatobiliary: The liver is diffusely hypoattenuating consistent with marked fatty infiltration. There are no focal lesions. The gallbladder is unremarkable. There is no biliary ductal dilatation. Pancreas: Unremarkable. Spleen: Unremarkable. Adrenals/Urinary Tract: Adrenals are unremarkable. The kidneys are unremarkable, with no focal lesion, stone, hydronephrosis, or hydroureter. The bladder is unremarkable. Stomach/Bowel: The stomach is unremarkable. There is no evidence of bowel obstruction. There is no abnormal bowel wall thickening or inflammatory change. There is colonic diverticulosis without evidence of acute diverticulitis. The appendix is normal. Vascular/Lymphatic: There is scattered calcified atherosclerotic plaque throughout the nonaneurysmal abdominal aorta. The major branch vessels are patent. The main portal and splenic veins are patent. There is no abdominal or pelvic lymphadenopathy. Reproductive: Prostate is enlarged measuring up to 5.3 cm transverse. The  seminal vesicles are unremarkable. Other: There is no ascites or free air. Musculoskeletal: There are subacute appearing fractures of the right sixth rib laterally and twelfth rib posteriorly. There is a 7 mm sclerotic lesion in the right iliac bone adjacent to the SI joint with a thin rim of surrounding lucency. IMPRESSION: 1. Patchy opacities in the lung bases, right more than left, may reflect infection or aspiration. 2. No acute findings in the abdomen or pelvis. 3. Subacute appearing fractures of the right sixth and twelfth ribs. 4. Severe fatty infiltration of the liver. 5. Diverticulosis without evidence of acute diverticulitis. 6. Prostatomegaly. 7. Indeterminate sclerotic lesion in the right iliac bone measuring approximately 7 mm. Correlation with any prior imaging would be helpful, if available, and correlate with PSA. In the absence of prior imaging, six-month follow-up CT could be considered to ensure stability. 8.  Aortic Atherosclerosis (ICD10-I70.0). Electronically Signed   By: Valetta Mole M.D.   On: 02/04/2021 09:49   DG Chest Portable 1 View  Result Date: 02/04/2021 CLINICAL DATA:  Weakness EXAM: PORTABLE CHEST 1 VIEW COMPARISON:  None. FINDINGS: The cardiomediastinal silhouette is normal. There is no focal consolidation or pulmonary edema. There is no pleural effusion or pneumothorax. There is no acute osseous abnormality. IMPRESSION: No radiographic evidence of acute cardiopulmonary process. Electronically Signed   By: Valetta Mole M.D.   On: 02/04/2021 08:30    EKG:   Reviewed by me, or if sinus tachycardia, right bundle branch block and left anterior fascicular block  IMPRESSION AND PLAN:   1.  Alcoholic ketoacidosis.  Patient given IV thiamine in the emergency room.  Patient receiving a fluid bolus.  We will change fluids over to D5 NS.  Start diet.  Alcohol withdrawal protocol 2.  Severe sepsis with lactic acidosis of 4.9.  Patient had tachycardia and leukocytosis.  CT scan  showing multifocal bilateral pneumonia.  Follow-up blood cultures.  ER physician ordered Rocephin and Zithromax and will continue at this time. 3.  Brief atrial fibrillation seen by ER physician sinus tachycardia currently.  Hold off on diltiazem drip and start oral metoprolol. 4.  Sclerotic lesion right iliac bone and weight loss.  With prostate being low which I will add on a PSA. 5.  Anemia.  We will send off iron studies and vitamin B12. 6.  Essential hypertension start metoprolol 7.  Hyperlipidemia unspecified hold cholesterol medication at this time. 8.  Nausea vomiting and diarrhea.  Stool studies if has any more diarrhea.  As needed nausea medications. 9.  Stage II decubiti, present on admission and sacral area.,  Stage I on other side sacral area.  Cover with foam pad   All the records, laboratory and radiological data are reviewed and case discussed with ED provider. Management plans discussed with the patient, family and they are in agreement.  Patient meets inpatient criteria with severe sepsis and alcoholic ketoacidosis will require over 2 midnights in the hospital.  CODE STATUS: Full code  TOTAL TIME TAKING CARE OF THIS PATIENT: 50 minutes.    Loletha Grayer M.D on 02/04/2021 at 12:10 PM  Between 7am to 6pm - Pager - [phone]  After 6pm call admission pager [phone]  [facility]  CC: Primary care physician; Pcp, No

## 2021-02-04 NOTE — ED Notes (Signed)
Pt called out to ask why they didn't feel better yet, this RN explained it would take time and was not something that would happen immediately d/t how sick this pt is.

## 2021-02-04 NOTE — ED Notes (Signed)
MD Wieting at bedside, this RN informed this MD at this time that we are unable to obtain two sets of cultures, pt stuck multiple times, multiple IV's placed, only one set obtained.

## 2021-02-04 NOTE — ED Notes (Signed)
This RN entered this pt's room to check on this pt, this pt had pulled off gown, pulled off male purewick, was urinating on the floor, and attempting to take out IV. This RN explained the need for this pt's IV, got a urinal for this pt to pee into, and cleaned this pt up with the help of [employer]. Full linen change, pt cleaned, purewick changed, brief and chuck pad changed, call bell within reach, side rails up. This RN did not pt a gown on this pt d/t this pt refusing gown.

## 2021-02-04 NOTE — ED Notes (Signed)
DIL with patient, states last drink was probably today.  No tremors noted. No sweat visible. Reports emesis this am- DIL reports this is chronic. Reports more agitation that usual.

## 2021-02-04 NOTE — ED Provider Notes (Signed)
[facility] Emergency Department Provider Note   ____________________________________________   Event Date/Time   First MD Initiated Contact with Patient 02/04/21 [phone]     (approximate)  I have reviewed the triage vital signs and the nursing notes.   HISTORY  Chief Complaint Weakness   HPI Charles Schmitt. is a 65 y.o. male patient reports he had been drinking heavily for several days but about 3 days ago (he told the nurse it was 2 days ago when he told another nurse it was yesterday) he stopped drinking because he had been vomiting.  Patient tells me he has not had anything to eat or drink for 3 days because he has been vomiting everything up.  He is also been coughing up yellow phlegm and feeling very weak and shaky.  He does not have any belly pain or chest pain or fever.      Past Medical History:  Diagnosis Date   Hypertension   Patient with hypercholesterolemia and erectile dysfunction due to arterial insufficiency also.  There are no problems to display for this patient.     Prior to Admission medications   Not on File    Allergies Patient has no known allergies.  No family history on file.  Social History Social History   Substance Use Topics   Alcohol use: Yes   Drug use: Not Currently    Review of Systems  Constitutional: No fever/chills Eyes: No visual changes. ENT: No sore throat. Cardiovascular: Denies chest pain. Respiratory: Denies shortness of breath. Gastrointestinal: No abdominal pain.   nausea,  vomiting.  No diarrhea.  No constipation. Genitourinary: Negative for dysuria. Musculoskeletal: Negative for back pain. Skin: Negative for rash. Neurological: Negative for headaches, focal weakness  ____________________________________________   PHYSICAL EXAM:  VITAL SIGNS: ED Triage Vitals [02/04/21 0732]  Enc Vitals Group     BP 91/80     Pulse Rate (!) 130     Resp (!) 22     Temp 97.9 F (36.6 C)      Temp Source Oral     SpO2 100 %     Weight 150 lb (68 kg)     Height 6\' 4"  (1.93 m)     Head Circumference      Peak Flow      Pain Score      Pain Loc      Pain Edu?      Excl. in GC?     Constitutional: Alert and oriented.  Very thin.  Smells ketotic. Eyes: Conjunctivae are normal. PER Head: Atraumatic. Nose: No congestion/rhinnorhea. Mouth/Throat: Mucous membranes are moist.  Oropharynx non-erythematous. Neck: No stridor.   Cardiovascular: Normal rate, regular rhythm. Grossly normal heart sounds.  Good peripheral circulation. Respiratory: Normal respiratory effort.  No retractions. Lungs CTAB. Gastrointestinal: Soft and nontender. No distention. No abdominal bruits.  Musculoskeletal: No lower extremity tenderness nor edema.   Neurologic:  Normal speech and language. No gross focal neurologic deficits are appreciated.  Skin:  Skin is warm, dry and intact. No rash noted. l.  ____________________________________________   LABS (all labs ordered are listed, but only abnormal results are displayed)  Labs Reviewed  BASIC METABOLIC PANEL - Abnormal; Notable for the following components:      Result Value   CO2 10 (*)    Glucose, Bld 128 (*)    Anion gap 29 (*)    All other components within normal limits  CBC - Abnormal; Notable for the following components:  WBC 11.0 (*)    RBC 2.96 (*)    Hemoglobin 9.5 (*)    HCT 32.0 (*)    MCV 108.1 (*)    MCHC 29.7 (*)    RDW 16.2 (*)    All other components within normal limits  HEPATIC FUNCTION PANEL - Abnormal; Notable for the following components:   Total Protein 8.5 (*)    Albumin 2.7 (*)    AST 54 (*)    Total Bilirubin 2.4 (*)    Bilirubin, Direct 0.4 (*)    Indirect Bilirubin 2.0 (*)    All other components within normal limits  CBC WITH DIFFERENTIAL/PLATELET - Abnormal; Notable for the following components:   RBC 2.91 (*)    Hemoglobin 9.7 (*)    HCT 31.5 (*)    MCV 108.2 (*)    RDW 16.3 (*)    Platelets 409  (*)    Neutro Abs 9.7 (*)    Lymphs Abs 0.5 (*)    All other components within normal limits  LACTIC ACID, PLASMA - Abnormal; Notable for the following components:   Lactic Acid, Venous 4.9 (*)    All other components within normal limits  ACETAMINOPHEN LEVEL - Abnormal; Notable for the following components:   Acetaminophen (Tylenol), Serum <10 (*)    All other components within normal limits  SALICYLATE LEVEL - Abnormal; Notable for the following components:   Salicylate Lvl <7.0 (*)    All other components within normal limits  CBG MONITORING, ED - Abnormal; Notable for the following components:   Glucose-Capillary 144 (*)    All other components within normal limits  RESP PANEL BY RT-PCR (FLU A&B, COVID) ARPGX2  CULTURE, BLOOD (SINGLE)  ETHANOL  LIPASE, BLOOD  URINALYSIS, ROUTINE W REFLEX MICROSCOPIC  LACTIC ACID, PLASMA  BLOOD GAS, VENOUS  BETA-HYDROXYBUTYRIC ACID  VOLATILES,BLD-ACETONE,ETHANOL,ISOPROP,METHANOL  MAGNESIUM  PHOSPHORUS   ____________________________________________  EKG  EKG read interpreted by me shows an irregularly irregular rhythm likely A. fib with RVR at about 130.  Normal axis.  Very irregular baseline is present.  No obvious ST segment elevation or depression however.  There are no old EKGs to compare to. ______________________  EKG #2 read interpreted by me shows sinus tachycardia rate of 118 right bundle branch block left anterior hemiblock or flipped T's in many of the chest leads and in aVL.  ______________________  RADIOLOGY Jill Poling, personally viewed and evaluated these images (plain radiographs) as part of my medical decision making, as well as reviewing the written report by the radiologist.  ED MD interpretation: Chest x-ray reviewed by me shows no obvious infiltrate.  There is more density in the upper lobes on both lungs then in the lower lobes which is somewhat unusual.  This may be due to overlapping shadows.  Official  radiology report(s): CT ABDOMEN PELVIS W CONTRAST  Result Date: 02/04/2021 CLINICAL DATA:  Weakness, possible pneumonia EXAM: CT ABDOMEN AND PELVIS WITH CONTRAST TECHNIQUE: Multidetector CT imaging of the abdomen and pelvis was performed using the standard protocol following bolus administration of intravenous contrast. CONTRAST:  OMNIPAQUE IOHEXOL 300 MG/ML  SOLN COMPARISON:  None. FINDINGS: Lower chest: There are patchy opacities in the bilateral lung bases, right worse than left. The imaged heart is unremarkable. Hepatobiliary: The liver is diffusely hypoattenuating consistent with marked fatty infiltration. There are no focal lesions. The gallbladder is unremarkable. There is no biliary ductal dilatation. Pancreas: Unremarkable. Spleen: Unremarkable. Adrenals/Urinary Tract: Adrenals are unremarkable. The kidneys are  unremarkable, with no focal lesion, stone, hydronephrosis, or hydroureter. The bladder is unremarkable. Stomach/Bowel: The stomach is unremarkable. There is no evidence of bowel obstruction. There is no abnormal bowel wall thickening or inflammatory change. There is colonic diverticulosis without evidence of acute diverticulitis. The appendix is normal. Vascular/Lymphatic: There is scattered calcified atherosclerotic plaque throughout the nonaneurysmal abdominal aorta. The major branch vessels are patent. The main portal and splenic veins are patent. There is no abdominal or pelvic lymphadenopathy. Reproductive: Prostate is enlarged measuring up to 5.3 cm transverse. The seminal vesicles are unremarkable. Other: There is no ascites or free air. Musculoskeletal: There are subacute appearing fractures of the right sixth rib laterally and twelfth rib posteriorly. There is a 7 mm sclerotic lesion in the right iliac bone adjacent to the SI joint with a thin rim of surrounding lucency. IMPRESSION: 1. Patchy opacities in the lung bases, right more than left, may reflect infection or aspiration. 2.  No acute findings in the abdomen or pelvis. 3. Subacute appearing fractures of the right sixth and twelfth ribs. 4. Severe fatty infiltration of the liver. 5. Diverticulosis without evidence of acute diverticulitis. 6. Prostatomegaly. 7. Indeterminate sclerotic lesion in the right iliac bone measuring approximately 7 mm. Correlation with any prior imaging would be helpful, if available, and correlate with PSA. In the absence of prior imaging, six-month follow-up CT could be considered to ensure stability. 8.  Aortic Atherosclerosis (ICD10-I70.0). Electronically Signed   By: Lesia Hausen M.D.   On: 02/04/2021 09:49   DG Chest Portable 1 View  Result Date: 02/04/2021 CLINICAL DATA:  Weakness EXAM: PORTABLE CHEST 1 VIEW COMPARISON:  None. FINDINGS: The cardiomediastinal silhouette is normal. There is no focal consolidation or pulmonary edema. There is no pleural effusion or pneumothorax. There is no acute osseous abnormality. IMPRESSION: No radiographic evidence of acute cardiopulmonary process. Electronically Signed   By: Lesia Hausen M.D.   On: 02/04/2021 08:30    ____________________________________________   PROCEDURES  Procedure(s) performed (including Critical Care):  Procedures   ____________________________________________   INITIAL IMPRESSION / ASSESSMENT AND PLAN / ED COURSE  Patient comes in reporting nausea and vomiting unable to keep down any fluids.  He smells ketotic.  His lactic acid is elevated.  This seems like he is having a starvation ketosis.  His anion gap is elevated markedly possibly due to the lactic acidosis.  He denies drinking anything except ethanol.  He specifically denied when she will wash her fluid and radiator fluid.  His white count is somewhat elevated.  This is possibly due to the pneumonia that we found on CT.  I have put sepsis orders and just in case that he is actually septic.  He was in A. fib with RVR but some diltiazem and fluids switched him into sinus  tachycardia rate of 118.  We will give him some more fluids and antibiotics.  This gentleman will need admission a for possible alcohol withdrawal developing be for his lactic acidosis and elevated anion gap and inability to tolerate anything p.o. and see for his pneumonia.             ____________________________________________   FINAL CLINICAL IMPRESSION(S) / ED DIAGNOSES  Final diagnoses:  Weakness  Inanition (HCC)  Elevated lactic acid level  Community acquired pneumonia, unspecified laterality     ED Discharge Orders     None        Note:  This document was prepared using Dragon voice recognition software and may include unintentional  dictation errors.    Arnaldo Natal, MD 02/04/21 1043

## 2021-02-05 DIAGNOSIS — R000 Tachycardia, unspecified: Secondary | ICD-10-CM | POA: Insufficient documentation

## 2021-02-05 DIAGNOSIS — I5022 Chronic systolic (congestive) heart failure: Secondary | ICD-10-CM

## 2021-02-05 DIAGNOSIS — M899 Disorder of bone, unspecified: Secondary | ICD-10-CM

## 2021-02-05 DIAGNOSIS — R739 Hyperglycemia, unspecified: Secondary | ICD-10-CM

## 2021-02-05 DIAGNOSIS — Z789 Other specified health status: Secondary | ICD-10-CM

## 2021-02-05 DIAGNOSIS — L8990 Pressure ulcer of unspecified site, unspecified stage: Secondary | ICD-10-CM | POA: Diagnosis present

## 2021-02-05 DIAGNOSIS — E876 Hypokalemia: Secondary | ICD-10-CM

## 2021-02-05 LAB — POTASSIUM: Potassium: 3.1 mmol/L — ABNORMAL LOW (ref 3.5–5.1)

## 2021-02-05 LAB — LACTIC ACID, PLASMA
Lactic Acid, Venous: 2.5 mmol/L (ref 0.5–1.9)
Lactic Acid, Venous: 2.4 mmol/L (ref 0.5–1.9)
Lactic Acid, Venous: 1.9 mmol/L (ref 0.5–1.9)

## 2021-02-05 LAB — COMPREHENSIVE METABOLIC PANEL
ALT: 18 U/L (ref 0–44)
AST: 28 U/L (ref 15–41)
Albumin: 2.3 g/dL — ABNORMAL LOW (ref 3.5–5.0)
Alkaline Phosphatase: 85 U/L (ref 38–126)
Anion gap: 9 (ref 5–15)
BUN: 6 mg/dL — ABNORMAL LOW (ref 8–23)
CO2: 21 mmol/L — ABNORMAL LOW (ref 22–32)
Calcium: 8.1 mg/dL — ABNORMAL LOW (ref 8.9–10.3)
Chloride: 109 mmol/L (ref 98–111)
Creatinine, Ser: 0.64 mg/dL (ref 0.61–1.24)
GFR, Estimated: >60 mL/min (ref >60)
Glucose, Bld: 213 mg/dL — ABNORMAL HIGH (ref 70–99)
Potassium: 2.9 mmol/L — ABNORMAL LOW (ref 3.5–5.1)
Sodium: 139 mmol/L (ref 135–145)
Total Bilirubin: 0.9 mg/dL (ref 0.3–1.2)
Total Protein: 6.6 g/dL (ref 6.5–8.1)

## 2021-02-05 LAB — RESPIRATORY PANEL BY PCR

## 2021-02-05 LAB — VOLATILES,BLD-ACETONE,ETHANOL,ISOPROP,METHANOL
Acetone, blood: 0.04 g/dL — ABNORMAL HIGH (ref 0.000–0.010)
Ethanol, blood: <0.01 g/dL (ref 0.000–0.010)
Isopropanol, blood: <0.01 g/dL (ref 0.000–0.010)
Methanol, blood: <0.01 g/dL (ref 0.000–0.010)

## 2021-02-05 LAB — CBC
HCT: 21.9 % — ABNORMAL LOW (ref 39.0–52.0)
Hemoglobin: 7.4 g/dL — ABNORMAL LOW (ref 13.0–17.0)
MCH: 33.5 pg (ref 26.0–34.0)
MCHC: 33.8 g/dL (ref 30.0–36.0)
MCV: 99.1 fL (ref 80.0–100.0)
Platelets: 271 10*3/uL (ref 150–400)
RBC: 2.21 MIL/uL — ABNORMAL LOW (ref 4.22–5.81)
RDW: 15.4 % (ref 11.5–15.5)
WBC: 8.8 10*3/uL (ref 4.0–10.5)
nRBC: 0.6 % — ABNORMAL HIGH (ref 0.0–0.2)

## 2021-02-05 LAB — ECHOCARDIOGRAM COMPLETE
Area-P 1/2: 8.25 cm2
Height: 76 in
Weight: 2400 oz

## 2021-02-05 LAB — FERRITIN: Ferritin: 1784 ng/mL — ABNORMAL HIGH (ref 24–336)

## 2021-02-05 LAB — VITAMIN B12: Vitamin B-12: 731 pg/mL (ref 180–914)

## 2021-02-05 LAB — HEMOGLOBIN A1C
Hgb A1c MFr Bld: 4.8 % (ref 4.8–5.6)
Mean Plasma Glucose: 91.06 mg/dL

## 2021-02-05 LAB — PROCALCITONIN: Procalcitonin: 0.22 ng/mL

## 2021-02-05 LAB — MAGNESIUM: Magnesium: 1.9 mg/dL (ref 1.7–2.4)

## 2021-02-05 LAB — HEMOGLOBIN AND HEMATOCRIT, BLOOD
HCT: 26.5 % — ABNORMAL LOW (ref 39.0–52.0)
Hemoglobin: 8.7 g/dL — ABNORMAL LOW (ref 13.0–17.0)

## 2021-02-05 MED ORDER — POTASSIUM CHLORIDE CRYS ER 20 MEQ PO TBCR
40.0000 meq | EXTENDED_RELEASE_TABLET | ORAL | Status: AC
  Administered 2021-02-05 (×2): 40 meq via ORAL
  Filled 2021-02-05 (×2): qty 2

## 2021-02-05 MED ORDER — SODIUM CHLORIDE 0.9 % IV BOLUS
500.0000 mL | Freq: Once | INTRAVENOUS | Status: AC
  Administered 2021-02-05: 12:00:00 500 mL via INTRAVENOUS

## 2021-02-05 MED ORDER — POTASSIUM CHLORIDE CRYS ER 20 MEQ PO TBCR
40.0000 meq | EXTENDED_RELEASE_TABLET | ORAL | Status: AC
  Administered 2021-02-05 (×2): 40 meq via ORAL
  Filled 2021-02-05 (×2): qty 2

## 2021-02-05 NOTE — Progress Notes (Signed)
PT Cancellation Note  Patient Details Name: Charles Schmitt. MRN: [id number] DOB: [birth year]   Cancelled Treatment:    Reason Eval/Treat Not Completed: Fatigue/lethargy limiting ability to participate. Patient continues to be very sleepy this pm. Has not touched his lunch tray. Declines attempts to perform PT eval at this time. Will re-attempt tomorrow.      Maryama Kuriakose 02/05/2021, 1:55 PM

## 2021-02-05 NOTE — Assessment & Plan Note (Addendum)
Normal magnesium. -Potassium supplementation as needed

## 2021-02-05 NOTE — Progress Notes (Signed)
PT Cancellation Note  Patient Details Name: Charles Schmitt. MRN: [id number] DOB: [birth year]   Cancelled Treatment:    Reason Eval/Treat Not Completed: Fatigue/lethargy limiting ability to participate. Patient sleeping on arrival to room and requests that I come back later. Will re-attempt this pm as time allows.     Gala Padovano 02/05/2021, 11:09 AM

## 2021-02-05 NOTE — Assessment & Plan Note (Addendum)
Appears to possibly be secondary to hypovolemia in addition to ketoacidosis. IV fluid resuscitation given. Improved and stable.

## 2021-02-05 NOTE — Assessment & Plan Note (Addendum)
Hgb 7.4 on admission, trended down to 7.1 and transfused last night.   No baseline. Iron stores replete, B12 replete.  No signs of GI blood loss with repeat FOBT negative.  No signs of hemolysis.    Folate deficiency noted today.  - Continue folate

## 2021-02-05 NOTE — Assessment & Plan Note (Signed)
Initial concern for GI pathogen etiology. No recurrent diarrhea while inpatient.

## 2021-02-05 NOTE — Assessment & Plan Note (Addendum)
Concern for atrial fibrillation noted in ED. Mention of conversion to sinus rhythm after diltiazem and IV fluids. Transitioned from Diltiazem to metoprolol. Transthoracic Echocardiogram significant for severely reduced EF. Normal atria sizes. Cardiology consulted and after review, no evidence of atrial fibrillation but rather sinus tachycardia with PACs. -Telemetry -Cardiology recommendations: beta-blocker

## 2021-02-05 NOTE — Assessment & Plan Note (Signed)
Right coccyx.

## 2021-02-05 NOTE — Assessment & Plan Note (Addendum)
Blood pressure soft due to cardiomyopathy. -Continue low dose Metop

## 2021-02-05 NOTE — Assessment & Plan Note (Signed)
Secondary to D5 IV fluids. Hemoglobin A1C of 4.8%.

## 2021-02-05 NOTE — Assessment & Plan Note (Addendum)
Sepsis and septic shock ruled out. Did not meet SIRS criteria on admission, and lactic acidosis likely related to liver injury, dehyrdation in setting of heavy alcohol use.    Complete 5 days azithromycin and Rocephin

## 2021-02-05 NOTE — Assessment & Plan Note (Signed)
Sclerotic lesion of right iliac bone. Associated weight loss. PSA ordered and is normal. Asymptomatic. Possible Pagets.

## 2021-02-05 NOTE — Hospital Course (Addendum)
Charles Schmitt. is a 65 y.o. male with a history of hypertension, hyperlipidemia, and alcohol abuse. Patient presented secondary to weakness and dyspnea and was found to have evidence of alcoholic ketoacidosis and associated lactic acidosis. IV fluids initiated with improvement. While in the ED, he also had an episode of atrial fibrillation but has now converted back to sinus rhythm. Transthoracic Echocardiogram obtained this admission shows new cardiomyopathy. Cardiology consulted with plan for heart catheterization which may be postponed for outpatient workup secondary to anemia.

## 2021-02-05 NOTE — Assessment & Plan Note (Addendum)
Newly diagnosed. EF of 30-35% on echo.  Suspect alcoholic.  Euvolemic -Continue metoprolol and new ivabradine - Other GDMT prevented by low BP   - Plan for outpatient ishcemic work up

## 2021-02-05 NOTE — Progress Notes (Signed)
PROGRESS NOTE    Charles Schmitt.  XBW:[id number] DOB: 1955/07/22 DOA: 02/04/2021 PCP: Oneita Hurt, No   Brief Narrative: Charles Bona. is a 65 y.o. male with a history of hypertension, hyperlipidemia, and alcohol abuse. Patient presented secondary to weakness and dyspnea and was found to have evidence of alcoholic ketoacidosis and associated lactic acidosis. IV fluids initiated with improvement. While in the ED, he also had an episode of atrial fibrillation but has now converted back to sinus rhythm. Transthoracic Echocardiogram obtained this admission shows new cardiomyopathy.   Assessment & Plan:   * Alcoholic ketoacidosis-resolved as of 02/05/2021, (present on admission) Patient given IV fluids with resolution.  Alcohol use -Continue CIWA, thiamine, MVI, folic acid  Hyperglycemia Secondary to D5 IV fluids. Hemoglobin A1C of 4.8%.  Hypokalemia Normal magnesium -Replete with Kdur  Chronic systolic heart failure (HCC) Newly diagnosed. EF of 30-35% on Transthoracic Echocardiogram from 12/27.  -Daily weights -Strict in/out -Cardiology consult in AM  Bone lesion Sclerotic lesion of right iliac bone. Associated weight loss. PSA ordered and is normal. Asymptomatic. Possible Pagets.  Transient atrial fibrillation (HCC) Noted in ED. Converted to sinus rhythm after diltiazem and IV fluids. Transitioned from Diltiazem to metoprolol. Transthoracic Echocardiogram significant for severely reduced EF. Normal atria sizes. -Telemetry -Cardiology consult in AM  Pressure injury of skin- (present on admission) Right coccyx.  Nausea vomiting and diarrhea- (present on admission) Initial concern for GI pathogen etiology. No recurrent diarrhea while inpatient.  Essential hypertension- (present on admission) -Continue metoprolol  Anemia- (present on admission) No baseline. Iron panel suggests anemia of chronic disease. Normal vitamin B12 level. Unsure of etiology. Hemoglobin down 7.4  today. -H&H this evening -CBC daily -FOBT   Multifocal pneumonia- (present on admission) CT imaging with patchy infiltrates possibly secondary to aspiration. Empirically started on Ceftriaxone and azithromycin. -Continue Ceftriaxone and Azithromycin  Lactic acidosis- (present on admission) Appears to possibly be secondary to hypovolemia. IV fluid resuscitation given. Improved and stable.     DVT prophylaxis: SCDs Code Status:   Code Status: Full Code Family Communication: None at bedside. Friend, Ms. Cousin on telephone Disposition Plan: Discharge home vs SNF pending improvement/stability of anemia, cardiology consult for new cardiomyopathy, PT/OT eval   Consultants:  None  Procedures:  TRANSTHORACIC ECHOCARDIOGRAM (02/04/2021) IMPRESSIONS     1. Left ventricular ejection fraction, by estimation, is 30 to 35%. The  left ventricle has moderate to severely decreased function. The left  ventricle demonstrates global hypokinesis. Left ventricular diastolic  parameters are indeterminate. There is  akinesis of the left ventricular, apical segment.   2. Right ventricular systolic function is mildly reduced. The right  ventricular size is normal.   3. The mitral valve is normal in structure. No evidence of mitral valve  regurgitation.   4. The aortic valve was not well visualized. Aortic valve regurgitation  is not visualized.   Antimicrobials: None    Subjective: No issues this morning  Objective: Vitals:   02/04/21 2259 02/05/21 0422 02/05/21 0820 02/05/21 1135  BP: 102/80 107/89 103/82 92/74  Pulse: 85 90 88 84  Resp: 18 16 16 16   Temp: 97.7 F (36.5 C) (!) 97.5 F (36.4 C) 97.9 F (36.6 C) 97.9 F (36.6 C)  TempSrc:      SpO2: 100% 100% 100% 96%  Weight:      Height:        Intake/Output Summary (Last 24 hours) at 02/05/2021 1145 Last data filed at 02/05/2021 1041 Gross per 24 hour  Intake 3233.85 ml  Output 50 ml  Net 3183.85 ml   Filed Weights    02/04/21 0732  Weight: 68 kg    Examination:  General exam: Appears calm and comfortable HEENT: dry mucous membranes Respiratory system: Clear to auscultation. Respiratory effort normal. Cardiovascular system: S1 & S2 heard, RRR. No murmurs, rubs, gallops or clicks. Gastrointestinal system: Abdomen is nondistended, soft and nontender. No organomegaly or masses felt. Normal bowel sounds heard. Central nervous system: Alert and oriented. No focal neurological deficits. Musculoskeletal: No edema. No calf tenderness Skin: No cyanosis. No rashes Psychiatry: Judgement and insight appear normal. Mood & affect appropriate.     Data Reviewed: I have personally reviewed following labs and imaging studies  CBC Lab Results  Component Value Date   WBC 8.8 02/05/2021   RBC 2.21 (L) 02/05/2021   HGB 7.4 (L) 02/05/2021   HCT 21.9 (L) 02/05/2021   MCV 99.1 02/05/2021   MCH 33.5 02/05/2021   PLT 271 02/05/2021   MCHC 33.8 02/05/2021   RDW 15.4 02/05/2021   LYMPHSABS 0.5 (L) 02/04/2021   MONOABS 0.2 02/04/2021   EOSABS 0.0 02/04/2021   BASOSABS 0.0 02/04/2021     Last metabolic panel Lab Results  Component Value Date   NA 139 02/05/2021   K 2.9 (L) 02/05/2021   CL 109 02/05/2021   CO2 21 (L) 02/05/2021   BUN 6 (L) 02/05/2021   CREATININE 0.64 02/05/2021   GLUCOSE 213 (H) 02/05/2021   GFRNONAA >60 02/05/2021   CALCIUM 8.1 (L) 02/05/2021   PHOS 4.1 02/04/2021   PROT 6.6 02/05/2021   ALBUMIN 2.3 (L) 02/05/2021   BILITOT 0.9 02/05/2021   ALKPHOS 85 02/05/2021   AST 28 02/05/2021   ALT 18 02/05/2021   ANIONGAP 9 02/05/2021    CBG (last 3)  Recent Labs    02/04/21 0804  GLUCAP 144*     GFR: Estimated Creatinine Clearance: 90.9 mL/min (by C-G formula based on SCr of 0.64 mg/dL).  Coagulation Profile: No results for input(s): INR, PROTIME in the last 168 hours.  Recent Results (from the past 240 hour(s))  Resp Panel by RT-PCR (Flu A&B, Covid) Nasopharyngeal Swab      Status: None   Collection Time: 02/04/21  8:30 AM   Specimen: Nasopharyngeal Swab; Nasopharyngeal(NP) swabs in vial transport medium  Result Value Ref Range Status   SARS Coronavirus 2 by RT PCR NEGATIVE NEGATIVE Final    Comment: (NOTE) SARS-CoV-2 target nucleic acids are NOT DETECTED.  The SARS-CoV-2 RNA is generally detectable in upper respiratory specimens during the acute phase of infection. The lowest concentration of SARS-CoV-2 viral copies this assay can detect is 138 copies/mL. A negative result does not preclude SARS-Cov-2 infection and should not be used as the sole basis for treatment or other patient management decisions. A negative result may occur with  improper specimen collection/handling, submission of specimen other than nasopharyngeal swab, presence of viral mutation(s) within the areas targeted by this assay, and inadequate number of viral copies(<138 copies/mL). A negative result must be combined with clinical observations, patient history, and epidemiological information. The expected result is Negative.  Fact Sheet for Patients:  BloggerCourse.com  Fact Sheet for Healthcare Providers:  SeriousBroker.it  This test is no t yet approved or cleared by the [place] FDA and  has been authorized for detection and/or diagnosis of SARS-CoV-2 by FDA under an Emergency Use Authorization (EUA). This EUA will remain  in effect (meaning this test can be used) for the  duration of the COVID-19 declaration under Section 564(b)(1) of the Act, 21 U.S.C.section 360bbb-3(b)(1), unless the authorization is terminated  or revoked sooner.       Influenza A by PCR NEGATIVE NEGATIVE Final   Influenza B by PCR NEGATIVE NEGATIVE Final    Comment: (NOTE) The Xpert Xpress SARS-CoV-2/FLU/RSV plus assay is intended as an aid in the diagnosis of influenza from Nasopharyngeal swab specimens and should not be used as a sole basis  for treatment. Nasal washings and aspirates are unacceptable for Xpert Xpress SARS-CoV-2/FLU/RSV testing.  Fact Sheet for Patients: BloggerCourse.com  Fact Sheet for Healthcare Providers: SeriousBroker.it  This test is not yet approved or cleared by the [place] FDA and has been authorized for detection and/or diagnosis of SARS-CoV-2 by FDA under an Emergency Use Authorization (EUA). This EUA will remain in effect (meaning this test can be used) for the duration of the COVID-19 declaration under Section 564(b)(1) of the Act, 21 U.S.C. section 360bbb-3(b)(1), unless the authorization is terminated or revoked.  Performed at [facility], [address] [place], [place] [address]   Culture, blood (single)     Status: None (Preliminary result)   Collection Time: 02/04/21 11:39 AM   Specimen: BLOOD  Result Value Ref Range Status   Specimen Description BLOOD BLOOD LEFT FOREARM  Final   Special Requests   Final    BOTTLES DRAWN AEROBIC ONLY Blood Culture results may not be optimal due to an inadequate volume of blood received in culture bottles   Culture   Final    NO GROWTH < 24 HOURS Performed at [facility], [address] [place], [place] [address]    Report Status PENDING  Incomplete  Culture, blood (routine x 2)     Status: None (Preliminary result)   Collection Time: 02/04/21  1:04 PM   Specimen: BLOOD  Result Value Ref Range Status   Specimen Description BLOOD RIGHT ANTECUBITAL  Final   Special Requests   Final    BOTTLES DRAWN AEROBIC AND ANAEROBIC Blood Culture results may not be optimal due to an inadequate volume of blood received in culture bottles   Culture   Final    NO GROWTH < 24 HOURS Performed at [facility], [address] [place], [place] [address]    Report Status PENDING  Incomplete        Radiology Studies: CT ABDOMEN PELVIS W CONTRAST  Result Date:  02/04/2021 CLINICAL DATA:  Weakness, possible pneumonia EXAM: CT ABDOMEN AND PELVIS WITH CONTRAST TECHNIQUE: Multidetector CT imaging of the abdomen and pelvis was performed using the standard protocol following bolus administration of intravenous contrast. CONTRAST:  OMNIPAQUE IOHEXOL 300 MG/ML  SOLN COMPARISON:  None. FINDINGS: Lower chest: There are patchy opacities in the bilateral lung bases, right worse than left. The imaged heart is unremarkable. Hepatobiliary: The liver is diffusely hypoattenuating consistent with marked fatty infiltration. There are no focal lesions. The gallbladder is unremarkable. There is no biliary ductal dilatation. Pancreas: Unremarkable. Spleen: Unremarkable. Adrenals/Urinary Tract: Adrenals are unremarkable. The kidneys are unremarkable, with no focal lesion, stone, hydronephrosis, or hydroureter. The bladder is unremarkable. Stomach/Bowel: The stomach is unremarkable. There is no evidence of bowel obstruction. There is no abnormal bowel wall thickening or inflammatory change. There is colonic diverticulosis without evidence of acute diverticulitis. The appendix is normal. Vascular/Lymphatic: There is scattered calcified atherosclerotic plaque throughout the nonaneurysmal abdominal aorta. The major branch vessels are patent. The main portal and splenic veins are patent. There  is no abdominal or pelvic lymphadenopathy. Reproductive: Prostate is enlarged measuring up to 5.3 cm transverse. The seminal vesicles are unremarkable. Other: There is no ascites or free air. Musculoskeletal: There are subacute appearing fractures of the right sixth rib laterally and twelfth rib posteriorly. There is a 7 mm sclerotic lesion in the right iliac bone adjacent to the SI joint with a thin rim of surrounding lucency. IMPRESSION: 1. Patchy opacities in the lung bases, right more than left, may reflect infection or aspiration. 2. No acute findings in the abdomen or pelvis. 3. Subacute appearing  fractures of the right sixth and twelfth ribs. 4. Severe fatty infiltration of the liver. 5. Diverticulosis without evidence of acute diverticulitis. 6. Prostatomegaly. 7. Indeterminate sclerotic lesion in the right iliac bone measuring approximately 7 mm. Correlation with any prior imaging would be helpful, if available, and correlate with PSA. In the absence of prior imaging, six-month follow-up CT could be considered to ensure stability. 8.  Aortic Atherosclerosis (ICD10-I70.0). Electronically Signed   By: Lesia Hausen M.D.   On: 02/04/2021 09:49   DG Chest Portable 1 View  Result Date: 02/04/2021 CLINICAL DATA:  Weakness EXAM: PORTABLE CHEST 1 VIEW COMPARISON:  None. FINDINGS: The cardiomediastinal silhouette is normal. There is no focal consolidation or pulmonary edema. There is no pleural effusion or pneumothorax. There is no acute osseous abnormality. IMPRESSION: No radiographic evidence of acute cardiopulmonary process. Electronically Signed   By: Lesia Hausen M.D.   On: 02/04/2021 08:30        Scheduled Meds:  enoxaparin (LOVENOX) injection  40 mg Subcutaneous Q24H   folic acid  1 mg Oral Daily   metoprolol tartrate  12.5 mg Oral BID   multivitamin with minerals  1 tablet Oral Daily   potassium chloride  40 mEq Oral Q4H   thiamine  100 mg Oral Daily   Or   thiamine  100 mg Intravenous Daily   Continuous Infusions:  azithromycin Stopped (02/05/21 1026)   cefTRIAXone (ROCEPHIN)  IV Stopped (02/05/21 0954)     LOS: 1 day     Charles Hawking, MD [facility] 02/05/2021, 11:45 AM  If 7PM-7AM, please contact night-coverage [email or web address]

## 2021-02-05 NOTE — Assessment & Plan Note (Signed)
-  Continue CIWA, thiamine, MVI, folic acid

## 2021-02-05 NOTE — Assessment & Plan Note (Signed)
Patient given IV fluids with resolution.

## 2021-02-06 ENCOUNTER — Encounter: Payer: Self-pay | Admitting: Internal Medicine

## 2021-02-06 DIAGNOSIS — F17210 Nicotine dependence, cigarettes, uncomplicated: Secondary | ICD-10-CM

## 2021-02-06 DIAGNOSIS — I429 Cardiomyopathy, unspecified: Secondary | ICD-10-CM

## 2021-02-06 DIAGNOSIS — F10188 Alcohol abuse with other alcohol-induced disorder: Secondary | ICD-10-CM

## 2021-02-06 DIAGNOSIS — I480 Paroxysmal atrial fibrillation: Secondary | ICD-10-CM

## 2021-02-06 DIAGNOSIS — I959 Hypotension, unspecified: Secondary | ICD-10-CM

## 2021-02-06 LAB — CBC
HCT: 23.2 % — ABNORMAL LOW (ref 39.0–52.0)
Hemoglobin: 7.8 g/dL — ABNORMAL LOW (ref 13.0–17.0)
MCH: 33.2 pg (ref 26.0–34.0)
MCHC: 33.6 g/dL (ref 30.0–36.0)
MCV: 98.7 fL (ref 80.0–100.0)
Platelets: 217 10*3/uL (ref 150–400)
RBC: 2.35 MIL/uL — ABNORMAL LOW (ref 4.22–5.81)
RDW: 15.8 % — ABNORMAL HIGH (ref 11.5–15.5)
WBC: 7.1 10*3/uL (ref 4.0–10.5)
nRBC: 0.4 % — ABNORMAL HIGH (ref 0.0–0.2)

## 2021-02-06 LAB — BASIC METABOLIC PANEL
Anion gap: 6 (ref 5–15)
BUN: <5 mg/dL — ABNORMAL LOW (ref 8–23)
CO2: 22 mmol/L (ref 22–32)
Calcium: 8.2 mg/dL — ABNORMAL LOW (ref 8.9–10.3)
Chloride: 112 mmol/L — ABNORMAL HIGH (ref 98–111)
Creatinine, Ser: 0.5 mg/dL — ABNORMAL LOW (ref 0.61–1.24)
GFR, Estimated: >60 mL/min (ref >60)
Glucose, Bld: 97 mg/dL (ref 70–99)
Potassium: 3.8 mmol/L (ref 3.5–5.1)
Sodium: 140 mmol/L (ref 135–145)

## 2021-02-06 LAB — IRON AND TIBC: Iron: 54 ug/dL (ref 45–182)

## 2021-02-06 LAB — FERRITIN: Ferritin: 1463 ng/mL — ABNORMAL HIGH (ref 24–336)

## 2021-02-06 LAB — TSH: TSH: 2.839 u[IU]/mL (ref 0.350–4.500)

## 2021-02-06 LAB — PROCALCITONIN: Procalcitonin: 0.14 ng/mL

## 2021-02-06 MED ORDER — GUAIFENESIN-DM 100-10 MG/5ML PO SYRP
5.0000 mL | ORAL_SOLUTION | ORAL | Status: DC | PRN
  Administered 2021-02-06 – 2021-02-09 (×3): 5 mL via ORAL
  Filled 2021-02-06 (×3): qty 5

## 2021-02-06 MED ORDER — SODIUM CHLORIDE 0.9% FLUSH
3.0000 mL | Freq: Two times a day (BID) | INTRAVENOUS | Status: DC
  Administered 2021-02-06 – 2021-02-10 (×8): 3 mL via INTRAVENOUS

## 2021-02-06 MED ORDER — SALINE SPRAY 0.65 % NA SOLN
1.0000 | NASAL | Status: DC | PRN
  Filled 2021-02-06: qty 44

## 2021-02-06 NOTE — Progress Notes (Signed)
°   02/06/21 0318  Assess: MEWS Score  Temp 98 F (36.7 C)  BP 91/79  Pulse Rate (!) 104  Resp 16  SpO2 100 %  O2 Device Room Air  Assess: MEWS Score  MEWS Temp 0  MEWS Systolic 1  MEWS Pulse 1  MEWS RR 0  MEWS LOC 0  MEWS Score 2  MEWS Score Color Yellow  Assess: if the MEWS score is Yellow or Red  Were vital signs taken at a resting state? Yes  Focused Assessment No change from prior assessment  Does the patient meet 2 or more of the SIRS criteria? No  MEWS guidelines implemented *See Row Information* Yes  Treat  Pain Scale 0-10  Pain Score 0  Take Vital Signs  Increase Vital Sign Frequency  Yellow: Q 2hr X 2 then Q 4hr X 2, if remains yellow, continue Q 4hrs  Escalate  MEWS: Escalate Yellow: discuss with charge nurse/RN and consider discussing with provider and RRT  Notify: Charge Nurse/RN  Name of Charge Nurse/RN Notified Lillia Abed, RN  Date Charge Nurse/RN Notified 02/06/21  Time Charge Nurse/RN Notified 0326  Notify: Provider  Provider Name/Title B. Jon Billings NP  Date Provider Notified 02/06/21  Time Provider Notified [phone]  Notification Type Page  Notification Reason Other (Comment) (Yellow MEWS)  Provider response No new orders  Date of Provider Response 02/06/21  Time of Provider Response 0330  Assess: SIRS CRITERIA  SIRS Temperature  0  SIRS Pulse 1  SIRS Respirations  0  SIRS WBC 0  SIRS Score Sum  1

## 2021-02-06 NOTE — Evaluation (Signed)
Physical Therapy Evaluation Patient Details Name: Charles Schmitt. MRN: [id number] DOB: Jun 07, 1955 Today's Date: 02/06/2021  History of Present Illness  Charles Schmitt  is a 65 y.o. male with a known history of hypertension hyperlipidemia and alcohol abuse presents with not feeling well for the past 4 to 7 days.  He has been feeling weak.  He has been short of breath.  He has been unable to eat with nausea and vomiting.  Some diarrhea.  No blood in the bowel movements.  No abdominal pain.  He feels like he is going to pass out.  He was found to be in alcoholic ketoacidosis and lactic acidosis.  Hospitalist services were contacted for further evaluation.  ER physician felt that he was in brief atrial fibrillation but now in sinus tachycardia.  Clinical Impression  Patient received in bed, he is more alert today, but reporting he is very weak. Patient reluctant to participate in PT, but with encouragement he agrees. Patient is independent with bed mobility. Required min/mod assist for standing and for transfer from bed to recliner. Patient is quite weak and will continue to benefit from skilled PT while here to improve mobility, strength and safety.         Recommendations for follow up therapy are one component of a multi-disciplinary discharge planning process, led by the attending physician.  Recommendations may be updated based on patient status, additional functional criteria and insurance authorization.  Follow Up Recommendations Other (comment) (Will benefit from SNF if no assistance at home. He wants to go home.)    Assistance Recommended at Discharge Frequent or constant Supervision/Assistance  Functional Status Assessment Patient has had a recent decline in their functional status and demonstrates the ability to make significant improvements in function in a reasonable and predictable amount of time.  Equipment Recommendations  Rolling walker (2 wheels)    Recommendations for Other  Services       Precautions / Restrictions Precautions Precautions: Fall Precaution Comments: endorses falls Restrictions Weight Bearing Restrictions: No      Mobility  Bed Mobility Overal bed mobility: Independent                  Transfers Overall transfer level: Needs assistance Equipment used: 1 person hand held assist Transfers: Sit to/from Stand;Bed to chair/wheelchair/BSC Sit to Stand: Mod assist Stand pivot transfers: Mod assist         General transfer comment: patient reluctant to get up to recliner, but with encouragement he does so with assist. Stand pivot only    Ambulation/Gait               General Gait Details: declines ambulation at this time  Stairs            Wheelchair Mobility    Modified Rankin (Stroke Patients Only)       Balance Overall balance assessment: Needs assistance Sitting-balance support: Feet supported Sitting balance-Leahy Scale: Good     Standing balance support: During functional activity;Bilateral upper extremity supported Standing balance-Leahy Scale: Poor Standing balance comment: requires assistance due to weakness                             Pertinent Vitals/Pain Pain Assessment: No/denies pain    Home Living Family/patient expects to be discharged to:: Private residence Living Arrangements: Alone             Home Layout: One level Home Equipment: None Additional Comments: patient  reports his girlfriend was living with him, she may have left him. Therefore he would be home alone if that is the case.    Prior Function Prior Level of Function : Independent/Modified Independent;Driving             Mobility Comments: patient reports he was hunting and fishing and driving, unsure how long ago that was. ADLs Comments: independent     Hand Dominance        Extremity/Trunk Assessment   Upper Extremity Assessment Upper Extremity Assessment: Generalized weakness     Lower Extremity Assessment Lower Extremity Assessment: Generalized weakness    Cervical / Trunk Assessment Cervical / Trunk Assessment: Normal  Communication   Communication: No difficulties  Cognition Arousal/Alertness: Awake/alert Behavior During Therapy: WFL for tasks assessed/performed Overall Cognitive Status: Within Functional Limits for tasks assessed                                          General Comments      Exercises     Assessment/Plan    PT Assessment Patient needs continued PT services  PT Problem List Decreased strength;Decreased mobility;Decreased safety awareness;Decreased activity tolerance;Decreased balance;Decreased knowledge of use of DME;Decreased knowledge of precautions;Decreased skin integrity       PT Treatment Interventions DME instruction;Therapeutic activities;Gait training;Therapeutic exercise;Patient/family education;Functional mobility training;Balance training    PT Goals (Current goals can be found in the Care Plan section)  Acute Rehab PT Goals Patient Stated Goal: wants to go home today PT Goal Formulation: With patient Time For Goal Achievement: 02/20/21 Potential to Achieve Goals: Fair    Frequency Min 2X/week   Barriers to discharge Decreased caregiver support living alone, may need additional assistance    Co-evaluation               AM-PAC PT "6 Clicks" Mobility  Outcome Measure Help needed turning from your back to your side while in a flat bed without using bedrails?: None Help needed moving from lying on your back to sitting on the side of a flat bed without using bedrails?: None Help needed moving to and from a bed to a chair (including a wheelchair)?: A Little Help needed standing up from a chair using your arms (e.g., wheelchair or bedside chair)?: A Little Help needed to walk in hospital room?: A Lot Help needed climbing 3-5 steps with a railing? : Total 6 Click Score: 17    End of  Session Equipment Utilized During Treatment: Gait belt Activity Tolerance: Patient limited by fatigue Patient left: in chair;with call bell/phone within reach;with chair alarm set Nurse Communication: Mobility status PT Visit Diagnosis: Muscle weakness (generalized) (M62.81);Difficulty in walking, not elsewhere classified (R26.2)    Time: 1308-6578 PT Time Calculation (min) (ACUTE ONLY): 30 min   Charges:   PT Evaluation $PT Eval Moderate Complexity: 1 Mod PT Treatments $Therapeutic Activity: 8-22 mins        [employer], PT, GCS 02/06/21,10:09 AM

## 2021-02-06 NOTE — Consult Note (Addendum)
Cardiology Consult    Patient ID: [name]. MRN: [id number], DOB/AGE: Sep 05, 1955   Admit date: 02/04/2021 Date of Consult: 02/06/2021  Primary Physician: Kathyrn Lass Primary Cardiologist: Nelva Bush, MD Requesting Provider: R. Nettey  Patient Profile    Charles Schmitt. is a 65 y.o. male with a history of hypertension, hyperlipidemia, tobacco abuse, and alcohol abuse, who is being seen today for the evaluation of cardiomyopathy at the request of Dr. Lonny Prude.  Past Medical History   Past Medical History:  Diagnosis Date   Alcohol abuse    a. 1/5 of vodka daily.   Cardiomyopathy ([place])    a. 01/2021 Echo: EF 30-35%, glob HK, apical AK. Mildly reduced RV fxn.   Hyperlipidemia    Hypertension    Tobacco abuse     Past Surgical History:  Procedure Laterality Date   NO PAST SURGERIES       Allergies  No Known Allergies  History of Present Illness    65 year old male with the above past medical history including hypertension, hyperlipidemia, tobacco abuse, and alcohol abuse.  Patient says that he does not go to the doctor and has not been on any medicines at home.  He lives locally with his wife and is reasonably active without symptoms or limitations at baseline.  He drinks 1/5 of vodka daily and smokes a few cigarettes every day.  He was in his usual state of health until about 1 to 2 months ago, when he started noticing dyspnea and weakness and his son took him to the emergency department at [facility].  Patient says that he left prior to being seen.  Symptoms of dyspnea seem to improve.  About a month ago, he started noticing swelling in his right lower leg and foot.  He did not seek medical attention for this and notes that this also eventually improved.    About 1 week ago, he started experiencing progressive weakness associate with cough and dyspnea.  His wife had been dealing with a sinus infection and he was not sure if maybe he caught something.  He was  not experiencing any fevers or chills.  Further, he denies myalgias.  He did not initially seek medical attention.  His wife says he stayed in bed for about 5 days straight and had significant difficulty due to weakness and dyspnea just getting up and going to the bathroom.  It was because of progressive symptoms, that he presented to the emergency department on December 27.  There, he was found to be anemic with an H&H of 9.5 and 32.0.  Lactate was elevated at 4.9 and subsequently 6.3.  He was placed on IV fluids.  ECG shows sinus tachycardia with PAC.  This was initially read as A. fib and he was placed on IV diltiazem.  Rate subsequently slowed to sinus tachycardia at 118.  CT of the abdomen and pelvis was performed showed patchy opacities in the lung bases, subacute appearing fractures of the right sixth and 12th ribs, severe fatty infiltration of the liver, diverticulosis without diverticulitis, prostatomegaly, and sclerotic lesion in the right iliac bone measuring 7 mm.  He was admitted to the medicine service and placed on CIWA protocol.  With hydration, lactic acid improved.  Echocardiogram performed December 27 shows an EF of 30 to 35% with global hypokinesis and apical akinesis.  There is mild reduction of right ventricular function.  Anemia has persisted with a hemoglobin hematocrit of 7.8 and 23.2 this morning.  We have  been asked to evaluate in the setting of cardiomyopathy.  Wife and son at bedside.  Inpatient Medications     folic acid  1 mg Oral Daily   metoprolol tartrate  12.5 mg Oral BID   multivitamin with minerals  1 tablet Oral Daily   thiamine  100 mg Oral Daily   Or   thiamine  100 mg Intravenous Daily    Family History    Family History  Problem Relation Age of Onset   Cancer Father    Diabetes Father    He indicated that his mother is deceased. He indicated that his father is deceased.   Social History    Social History   Socioeconomic History   Marital status:  Single    Spouse name: Not on file   Number of children: Not on file   Years of education: Not on file   Highest education level: Not on file  Occupational History   Not on file  Tobacco Use   Smoking status: Some Days    Packs/day: 0.25    Years: 40.00    Pack years: 10.00    Types: Cigarettes   Smokeless tobacco: Never  Vaping Use   Vaping Use: Never used  Substance and Sexual Activity   Alcohol use: Yes    Comment: 1/5 vodka per day   Drug use: Not Currently   Sexual activity: Not on file  Other Topics Concern   Not on file  Social History Narrative   Lives locally with his wife.  Generally active without symptoms or limitations.  Drinks 1/5 of vodka daily.   Social Determinants of Health   Financial Resource Strain: Not on file  Food Insecurity: Not on file  Transportation Needs: Not on file  Physical Activity: Not on file  Stress: Not on file  Social Connections: Not on file  Intimate Partner Violence: Not on file     Review of Systems    General:  No chills, fever, night sweats or weight changes. +++  Weakness and malaise prior to admission. Cardiovascular:  No chest pain, +++ dyspnea on exertion, +++ right lower extremity edema, no orthopnea, palpitations, paroxysmal nocturnal dyspnea. Dermatological: No rash, lesions/masses Respiratory: +++ cough, +++ dyspnea Urologic: No hematuria, dysuria Abdominal:   No nausea, vomiting, diarrhea, bright red blood per rectum, melena, or hematemesis Neurologic:  No visual changes, changes in mental status. All other systems reviewed and are otherwise negative except as noted above.  Physical Exam    Blood pressure (!) 82/62, pulse (!) 101, temperature 97.7 F (36.5 C), resp. rate 18, height 6\' 4"  (1.93 m), weight 57.9 kg, SpO2 100 %.  General: Pleasant, NAD Psych: Normal affect. Neuro: Alert and oriented X 3. Moves all extremities spontaneously. HEENT: Normal  Neck: Supple without bruits or JVD. Lungs:  Resp regular  and unlabored, diminished breath sounds bilaterally. Heart: RRR no s3, s4, or murmurs. Abdomen: Soft, non-tender, non-distended, BS + x 4.  Extremities: No clubbing, cyanosis or edema. DP/PT2+, Radials 2+ and equal bilaterally.  Labs       Lab Results  Component Value Date   WBC 7.1 02/06/2021   HGB 7.8 (L) 02/06/2021   HCT 23.2 (L) 02/06/2021   MCV 98.7 02/06/2021   PLT 217 02/06/2021    Recent Labs  Lab 02/05/21 0453 02/05/21 1651 02/06/21 0610  NA 139  --  140  K 2.9*   < > 3.8  CL 109  --  112*  CO2 21*  --  22  BUN 6*  --  <5*  CREATININE 0.64  --  0.50*  CALCIUM 8.1*  --  8.2*  PROT 6.6  --   --   BILITOT 0.9  --   --   ALKPHOS 85  --   --   ALT 18  --   --   AST 28  --   --   GLUCOSE 213*  --  97   < > = values in this interval not displayed.      Radiology Studies    CT ABDOMEN PELVIS W CONTRAST  Result Date: 02/04/2021 CLINICAL DATA:  Weakness, possible pneumonia EXAM: CT ABDOMEN AND PELVIS WITH CONTRAST TECHNIQUE: Multidetector CT imaging of the abdomen and pelvis was performed using the standard protocol following bolus administration of intravenous contrast. CONTRAST:  134mL OMNIPAQUE IOHEXOL 300 MG/ML  SOLN COMPARISON:  None. FINDINGS: Lower chest: There are patchy opacities in the bilateral lung bases, right worse than left. The imaged heart is unremarkable. Hepatobiliary: The liver is diffusely hypoattenuating consistent with marked fatty infiltration. There are no focal lesions. The gallbladder is unremarkable. There is no biliary ductal dilatation. Pancreas: Unremarkable. Spleen: Unremarkable. Adrenals/Urinary Tract: Adrenals are unremarkable. The kidneys are unremarkable, with no focal lesion, stone, hydronephrosis, or hydroureter. The bladder is unremarkable. Stomach/Bowel: The stomach is unremarkable. There is no evidence of bowel obstruction. There is no abnormal bowel wall thickening or inflammatory change. There is colonic diverticulosis without  evidence of acute diverticulitis. The appendix is normal. Vascular/Lymphatic: There is scattered calcified atherosclerotic plaque throughout the nonaneurysmal abdominal aorta. The major branch vessels are patent. The main portal and splenic veins are patent. There is no abdominal or pelvic lymphadenopathy. Reproductive: Prostate is enlarged measuring up to 5.3 cm transverse. The seminal vesicles are unremarkable. Other: There is no ascites or free air. Musculoskeletal: There are subacute appearing fractures of the right sixth rib laterally and twelfth rib posteriorly. There is a 7 mm sclerotic lesion in the right iliac bone adjacent to the SI joint with a thin rim of surrounding lucency. IMPRESSION: 1. Patchy opacities in the lung bases, right more than left, may reflect infection or aspiration. 2. No acute findings in the abdomen or pelvis. 3. Subacute appearing fractures of the right sixth and twelfth ribs. 4. Severe fatty infiltration of the liver. 5. Diverticulosis without evidence of acute diverticulitis. 6. Prostatomegaly. 7. Indeterminate sclerotic lesion in the right iliac bone measuring approximately 7 mm. Correlation with any prior imaging would be helpful, if available, and correlate with PSA. In the absence of prior imaging, six-month follow-up CT could be considered to ensure stability. 8.  Aortic Atherosclerosis (ICD10-I70.0). Electronically Signed   By: Valetta Mole M.D.   On: 02/04/2021 09:49   DG Chest Portable 1 View  Result Date: 02/04/2021 CLINICAL DATA:  Weakness EXAM: PORTABLE CHEST 1 VIEW COMPARISON:  None. FINDINGS: The cardiomediastinal silhouette is normal. There is no focal consolidation or pulmonary edema. There is no pleural effusion or pneumothorax. There is no acute osseous abnormality. IMPRESSION: No radiographic evidence of acute cardiopulmonary process. Electronically Signed   By: Valetta Mole M.D.   On: 02/04/2021 08:30   ECG & Cardiac Imaging    12/27 0802: Sinus  tachycardia, PACs, 130, left axis deviation, right bundle branch block, prior inferior infarct - personally reviewed.  12/27 0939: Sinus tachycardia, 118, left axis deviation, left anterior fascicular block, right bundle branch block - personally reviewed.  Telemetry: Sinus rhythm to sinus tachycardia with prolonged run of  nonsustained ventricular tachycardia noted  Assessment & Plan    1.  Cardiomyopathy: Patient with history of alcoholism and without prior cardiac history, was admitted with a 5 to 7-day history of progressive weakness and dyspnea on exertion.  He has been treated for Alcoholic ketoacidosis.  Echocardiogram was performed and showed an EF of 30 to 35% with global hypokinesis and apical akinesis.  RV function is mildly reduced.  He denies any prior history of chest pain or dyspnea on exertion.  ECG notable for sinus tachycardia with left axis deviation, left anterior fascicular block, and right bundle branch block.  Discussed echocardiographic findings with patient and family today.  Given LV dysfunction and ongoing risk factors, recommend diagnostic catheterization provided that hemoglobin and hematocrit remained stable.  The patient understands that risks include but are not limited to stroke (1 in 1000), death (1 in 1000), kidney failure [usually temporary] (1 in 500), bleeding (1 in 200), allergic reaction [possibly serious] (1 in 200), and agrees to proceed.  Continue beta-blocker.  Blood pressure is too soft to initiate acei/arb/arni/mra.  We will also hold off on SGLT2 inhibitor at this time.  2.  Normocytic anemia: Likely secondary to chronic disease and alcoholism.  Follow with low threshold to transfuse for further significant drops.  3.  Hypotension: Blood pressure 82/62.  He has been receiving maintenance IV fluids.  Cautious use of beta-blocker therapy at this time.  4.  Sinus tachycardia: This was initially called atrial fibrillation on ECG in the ED however, review shows  sinus tachycardia with PAC and baseline artifact.  Continue low-dose beta-blocker in setting of above.  In the absence of atrial fibrillation, there is no role for anticoagulation.  5.  Alcohol abuse: Cessation advised.  6.  Tobacco abuse: Cessation advised.  7.  Right iliac lesion: Incidentally noted on CT of the abdomen and pelvis.  PSA was normal.  Further evaluation per medicine team.  8.  Alcohol ketoacidosis: Lactate has improved since admission.  IV fluids per medicine team -we will need to continue to watch volume status closely in the setting of LV dysfunction.  Signed, Nicolasa Ducking, NP 02/06/2021, 1:10 PM  For questions or updates, please contact   Please consult [email or web address] for contact info under Cardiology/STEMI.

## 2021-02-06 NOTE — Progress Notes (Signed)
PROGRESS NOTE    Charles Schmitt.  WRU:[id number] DOB: 27-Feb-1955 DOA: 02/04/2021 PCP: Oneita Hurt, No   Brief Narrative: Charles Deharo. is a 65 y.o. male with a history of hypertension, hyperlipidemia, and alcohol abuse. Patient presented secondary to weakness and dyspnea and was found to have evidence of alcoholic ketoacidosis and associated lactic acidosis. IV fluids initiated with improvement. While in the ED, he also had an episode of atrial fibrillation but has now converted back to sinus rhythm. Transthoracic Echocardiogram obtained this admission shows new cardiomyopathy. Cardiology consulted with plan for heart catheterization.   Assessment & Plan:   * Alcoholic ketoacidosis-resolved as of 02/05/2021, (present on admission) Patient given IV fluids with resolution.  Alcohol use -Continue CIWA, thiamine, MVI, folic acid  Hyperglycemia Secondary to D5 IV fluids. Hemoglobin A1C of 4.8%.  Chronic systolic heart failure (HCC) Newly diagnosed. EF of 30-35% on Transthoracic Echocardiogram from 12/27.  -Daily weights -Strict in/out -Cardiology recommendations: plan for LHC  Bone lesion Sclerotic lesion of right iliac bone. Associated weight loss. PSA ordered and is normal. Asymptomatic. Possible Pagets.  Sinus tachycardia Concern for atrial fibrillation noted in ED. Mention of conversion to sinus rhythm after diltiazem and IV fluids. Transitioned from Diltiazem to metoprolol. Transthoracic Echocardiogram significant for severely reduced EF. Normal atria sizes. Cardiology consulted and after review, no evidence of atrial fibrillation but rather sinus tachycardia with PACs. -Telemetry -Cardiology recommendations: beta-blocker  Pressure injury of skin- (present on admission) Right coccyx.  Essential hypertension- (present on admission) -Continue metoprolol  Anemia- (present on admission) No baseline. Iron panel suggests anemia of chronic disease. Normal vitamin B12 level.  Unsure of etiology. Patient states he has had a colonoscopy earlier this year that was normal. Hemoglobin stable. No overt signs of bleeding. -CBC daily -FOBT   Multifocal pneumonia- (present on admission) CT imaging with patchy infiltrates possibly secondary to aspiration. Empirically started on Ceftriaxone and azithromycin. -Continue Ceftriaxone and Azithromycin  Hypokalemia-resolved as of 02/06/2021 Normal magnesium. Resolved with supplementation.  Nausea vomiting and diarrhea-resolved as of 02/06/2021, (present on admission) Initial concern for GI pathogen etiology. No recurrent diarrhea while inpatient.  Lactic acidosis-resolved as of 02/06/2021, (present on admission) Appears to possibly be secondary to hypovolemia in addition to ketoacidosis. IV fluid resuscitation given. Improved and stable.     DVT prophylaxis: SCDs Code Status:   Code Status: Full Code Family Communication: None at bedside. Friend, Ms. Cousin on telephone (12/29) Disposition Plan: Discharge home vs SNF pending improvement/stability of anemia, cardiology consult for new cardiomyopathy. PT recommending supervision which patient may or may not have but patient would like discharge home when appropriate   Consultants:  Cardiolgy  Procedures:  TRANSTHORACIC ECHOCARDIOGRAM (02/04/2021) IMPRESSIONS     1. Left ventricular ejection fraction, by estimation, is 30 to 35%. The  left ventricle has moderate to severely decreased function. The left  ventricle demonstrates global hypokinesis. Left ventricular diastolic  parameters are indeterminate. There is  akinesis of the left ventricular, apical segment.   2. Right ventricular systolic function is mildly reduced. The right  ventricular size is normal.   3. The mitral valve is normal in structure. No evidence of mitral valve  regurgitation.   4. The aortic valve was not well visualized. Aortic valve regurgitation  is not visualized.   Antimicrobials: None     Subjective: Feeling cold. No other concerns.  Objective: Vitals:   02/06/21 0500 02/06/21 0519 02/06/21 0754 02/06/21 1151  BP:  90/76 97/77 (!) 82/62  Pulse:  Marland Kitchen)  106 (!) 110 (!) 101  Resp:  Temp:  97.9 F (36.6 C) 98.3 F (36.8 C) 97.7 F (36.5 C)  TempSrc:  Oral    SpO2:  99% 100% 100%  Weight: 57.9 kg     Height:        Intake/Output Summary (Last 24 hours) at 02/06/2021 1420 Last data filed at 02/06/2021 1340 Gross per 24 hour  Intake 1370 ml  Output --  Net 1370 ml    Filed Weights   02/04/21 0732 02/06/21 0500  Weight: 68 kg 57.9 kg    Examination:  General exam: Appears calm and comfortable Respiratory system: Clear to auscultation. Respiratory effort normal. Cardiovascular system: S1 & S2 heard, RRR. No murmurs, rubs, gallops or clicks. Gastrointestinal system: Abdomen is nondistended, soft and nontender. No organomegaly or masses felt. Normal bowel sounds heard. Central nervous system: Alert and oriented. No focal neurological deficits. Musculoskeletal: No edema. No calf tenderness Skin: No cyanosis. No rashes Psychiatry: Judgement and insight appear normal. Mood & affect appropriate.     Data Reviewed: I have personally reviewed following labs and imaging studies  CBC Lab Results  Component Value Date   WBC 7.1 02/06/2021   RBC 2.35 (L) 02/06/2021   HGB 7.8 (L) 02/06/2021   HCT 23.2 (L) 02/06/2021   MCV 98.7 02/06/2021   MCH 33.2 02/06/2021   PLT 217 02/06/2021   MCHC 33.6 02/06/2021   RDW 15.8 (H) 02/06/2021   LYMPHSABS 0.5 (L) 02/04/2021   MONOABS 0.2 02/04/2021   EOSABS 0.0 02/04/2021   BASOSABS 0.0 02/04/2021     Last metabolic panel Lab Results  Component Value Date   NA 140 02/06/2021   K 3.8 02/06/2021   CL 112 (H) 02/06/2021   CO2 22 02/06/2021   BUN <5 (L) 02/06/2021   CREATININE 0.50 (L) 02/06/2021   GLUCOSE 97 02/06/2021   GFRNONAA >60 02/06/2021   CALCIUM 8.2 (L) 02/06/2021   PHOS 4.1 02/04/2021    PROT 6.6 02/05/2021   ALBUMIN 2.3 (L) 02/05/2021   BILITOT 0.9 02/05/2021   ALKPHOS 85 02/05/2021   AST 28 02/05/2021   ALT 18 02/05/2021   ANIONGAP 6 02/06/2021    CBG (last 3)  Recent Labs    02/04/21 0804  GLUCAP 144*      GFR: Estimated Creatinine Clearance: 77.4 mL/min (A) (by C-G formula based on SCr of 0.5 mg/dL (L)).  Coagulation Profile: No results for input(s): INR, PROTIME in the last 168 hours.  Recent Results (from the past 240 hour(s))  Resp Panel by RT-PCR (Flu A&B, Covid) Nasopharyngeal Swab     Status: None   Collection Time: 02/04/21  8:30 AM   Specimen: Nasopharyngeal Swab; Nasopharyngeal(NP) swabs in vial transport medium  Result Value Ref Range Status   SARS Coronavirus 2 by RT PCR NEGATIVE NEGATIVE Final    Comment: (NOTE) SARS-CoV-2 target nucleic acids are NOT DETECTED.  The SARS-CoV-2 RNA is generally detectable in upper respiratory specimens during the acute phase of infection. The lowest concentration of SARS-CoV-2 viral copies this assay can detect is 138 copies/mL. A negative result does not preclude SARS-Cov-2 infection and should not be used as the sole basis for treatment or other patient management decisions. A negative result may occur with  improper specimen collection/handling, submission of specimen other than nasopharyngeal swab, presence of viral mutation(s) within the areas targeted by this assay, and inadequate number of viral copies(<138 copies/mL). A negative result must be combined with clinical  observations, patient history, and epidemiological information. The expected result is Negative.  Fact Sheet for Patients:  BloggerCourse.com  Fact Sheet for Healthcare Providers:  SeriousBroker.it  This test is no t yet approved or cleared by the [place] FDA and  has been authorized for detection and/or diagnosis of SARS-CoV-2 by FDA under an Emergency Use Authorization  (EUA). This EUA will remain  in effect (meaning this test can be used) for the duration of the COVID-19 declaration under Section 564(b)(1) of the Act, 21 U.S.C.section 360bbb-3(b)(1), unless the authorization is terminated  or revoked sooner.       Influenza A by PCR NEGATIVE NEGATIVE Final   Influenza B by PCR NEGATIVE NEGATIVE Final    Comment: (NOTE) The Xpert Xpress SARS-CoV-2/FLU/RSV plus assay is intended as an aid in the diagnosis of influenza from Nasopharyngeal swab specimens and should not be used as a sole basis for treatment. Nasal washings and aspirates are unacceptable for Xpert Xpress SARS-CoV-2/FLU/RSV testing.  Fact Sheet for Patients: BloggerCourse.com  Fact Sheet for Healthcare Providers: SeriousBroker.it  This test is not yet approved or cleared by the [place] FDA and has been authorized for detection and/or diagnosis of SARS-CoV-2 by FDA under an Emergency Use Authorization (EUA). This EUA will remain in effect (meaning this test can be used) for the duration of the COVID-19 declaration under Section 564(b)(1) of the Act, 21 U.S.C. section 360bbb-3(b)(1), unless the authorization is terminated or revoked.  Performed at [facility], [address] [place], [place] [address]   Culture, blood (single)     Status: None (Preliminary result)   Collection Time: 02/04/21 11:39 AM   Specimen: BLOOD  Result Value Ref Range Status   Specimen Description BLOOD BLOOD LEFT FOREARM  Final   Special Requests   Final    BOTTLES DRAWN AEROBIC ONLY Blood Culture results may not be optimal due to an inadequate volume of blood received in culture bottles   Culture   Final    NO GROWTH 2 DAYS Performed at [facility], [address], [place], [place] [address]    Report Status PENDING  Incomplete  Culture, blood (routine x 2)     Status: None (Preliminary result)   Collection Time: 02/04/21   1:04 PM   Specimen: BLOOD  Result Value Ref Range Status   Specimen Description BLOOD RIGHT ANTECUBITAL  Final   Special Requests   Final    BOTTLES DRAWN AEROBIC AND ANAEROBIC Blood Culture results may not be optimal due to an inadequate volume of blood received in culture bottles   Culture   Final    NO GROWTH 2 DAYS Performed at [facility], [address] [place], [place] [address]    Report Status PENDING  Incomplete  Respiratory (~20 pathogens) panel by PCR     Status: None   Collection Time: 02/05/21 12:08 PM   Specimen: Nasopharyngeal Swab; Respiratory  Result Value Ref Range Status   Adenovirus NOT DETECTED NOT DETECTED Final   Coronavirus 229E NOT DETECTED NOT DETECTED Final    Comment: (NOTE) The Coronavirus on the Respiratory Panel, DOES NOT test for the novel  Coronavirus (2019 nCoV)    Coronavirus HKU1 NOT DETECTED NOT DETECTED Final   Coronavirus NL63 NOT DETECTED NOT DETECTED Final   Coronavirus OC43 NOT DETECTED NOT DETECTED Final   Metapneumovirus NOT DETECTED NOT DETECTED Final   Rhinovirus / Enterovirus NOT DETECTED NOT DETECTED Final   Influenza A NOT DETECTED NOT DETECTED Final  Influenza B NOT DETECTED NOT DETECTED Final   Parainfluenza Virus 1 NOT DETECTED NOT DETECTED Final   Parainfluenza Virus 2 NOT DETECTED NOT DETECTED Final   Parainfluenza Virus 3 NOT DETECTED NOT DETECTED Final   Parainfluenza Virus 4 NOT DETECTED NOT DETECTED Final   Respiratory Syncytial Virus NOT DETECTED NOT DETECTED Final   Bordetella pertussis NOT DETECTED NOT DETECTED Final   Bordetella Parapertussis NOT DETECTED NOT DETECTED Final   Chlamydophila pneumoniae NOT DETECTED NOT DETECTED Final   Mycoplasma pneumoniae NOT DETECTED NOT DETECTED Final    Comment: Performed at [facility], [address], [place], [place] [address]         Radiology Studies: ECHOCARDIOGRAM COMPLETE  Result Date: 02/05/2021    ECHOCARDIOGRAM REPORT   Patient Name:    Charles Trostle. Date of Exam: 02/04/2021 Medical Rec #:  [id number]          Height:       76.0 in Accession #:    [id number]         Weight:       150.0 lb Date of Birth:  Sep 13, 1955          BSA:          1.960 m Patient Age:    65 years           BP:           122/85 mmHg Patient Gender: M                  HR:           87 bpm. Exam Location:  [facility] Procedure: 2D Echo, Cardiac Doppler and Color Doppler Indications:     I48.91 Atrial Fibrillation  History:         Patient has no prior history of Echocardiogram examinations.                  Risk Factors:Hypertension and Dyslipidemia.  Sonographer:     Daphine Deutscher RDCS Referring Phys:  [id number] Alford Highland Diagnosing Phys: Debbe Odea MD IMPRESSIONS  1. Left ventricular ejection fraction, by estimation, is 30 to 35%. The left ventricle has moderate to severely decreased function. The left ventricle demonstrates global hypokinesis. Left ventricular diastolic parameters are indeterminate. There is akinesis of the left ventricular, apical segment.  2. Right ventricular systolic function is mildly reduced. The right ventricular size is normal.  3. The mitral valve is normal in structure. No evidence of mitral valve regurgitation.  4. The aortic valve was not well visualized. Aortic valve regurgitation is not visualized. FINDINGS  Left Ventricle: Left ventricular ejection fraction, by estimation, is 30 to 35%. The left ventricle has moderate to severely decreased function. The left ventricle demonstrates global hypokinesis. The left ventricular internal cavity size was normal in size. There is no left ventricular hypertrophy. Left ventricular diastolic parameters are indeterminate. Right Ventricle: The right ventricular size is normal. No increase in right ventricular wall thickness. Right ventricular systolic function is mildly reduced. Left Atrium: Left atrial size was normal in size. Right Atrium: Right atrial size was normal in size. Pericardium:  There is no evidence of pericardial effusion. Mitral Valve: The mitral valve is normal in structure. No evidence of mitral valve regurgitation. Tricuspid Valve: The tricuspid valve is not well visualized. Tricuspid valve regurgitation is not demonstrated. Aortic Valve: The aortic valve was not well visualized. Aortic valve regurgitation is not visualized. Pulmonic Valve: The pulmonic valve was not well  visualized. Pulmonic valve regurgitation is not visualized. Aorta: The aortic root is normal in size and structure. Venous: The inferior vena cava was not well visualized. IAS/Shunts: No atrial level shunt detected by color flow Doppler.  LEFT VENTRICLE PLAX 2D LVOT diam:     2.10 cm   Diastology LV SV:         32        LV e' medial:    6.31 cm/s LV SV Index:   17        LV E/e' medial:  10.1 LVOT Area:     3.46 cm  LV e' lateral:   6.20 cm/s                          LV E/e' lateral: 10.3  RIGHT VENTRICLE RV Basal diam:  3.50 cm RV S prime:     8.70 cm/s TAPSE (M-mode): 2.0 cm LEFT ATRIUM             Index        RIGHT ATRIUM           Index LA Vol (A2C):   33.9 ml 17.29 ml/m  RA Area:     10.60 cm LA Vol (A4C):   41.7 ml 21.27 ml/m  RA Volume:   20.60 ml  10.51 ml/m LA Biplane Vol: 38.7 ml 19.74 ml/m  AORTIC VALVE LVOT Vmax:   48.93 cm/s LVOT Vmean:  37.933 cm/s LVOT VTI:    0.094 m MITRAL VALVE MV Area (PHT): 8.25 cm    SHUNTS MV Decel Time: 92 msec     Systemic VTI:  0.09 m MV E velocity: 63.70 cm/s  Systemic Diam: 2.10 cm MV A velocity: 52.60 cm/s MV E/A ratio:  1.21 Debbe Odea MD Electronically signed by Debbe Odea MD Signature Date/Time: 02/05/2021/4:47:35 PM    Final         Scheduled Meds:  folic acid  1 mg Oral Daily   metoprolol tartrate  12.5 mg Oral BID   multivitamin with minerals  1 tablet Oral Daily   sodium chloride flush  3 mL Intravenous Q12H   thiamine  100 mg Oral Daily   Or   thiamine  100 mg Intravenous Daily   Continuous Infusions:  azithromycin Stopped  (02/06/21 1053)   cefTRIAXone (ROCEPHIN)  IV Stopped (02/06/21 1024)     LOS: 2 days     Jacquelin Hawking, MD [facility] 02/06/2021, 2:20 PM  If 7PM-7AM, please contact night-coverage [email or web address]

## 2021-02-07 ENCOUNTER — Encounter
Admission: EM | Disposition: A | Discharge status: Home / Self Care | Payer: Self-pay | Source: Home / Self Care | Attending: Family Medicine

## 2021-02-07 DIAGNOSIS — I429 Cardiomyopathy, unspecified: Secondary | ICD-10-CM

## 2021-02-07 LAB — GASTROINTESTINAL PANEL BY PCR, STOOL (REPLACES STOOL CULTURE)

## 2021-02-07 LAB — BASIC METABOLIC PANEL
Anion gap: 7 (ref 5–15)
BUN: <5 mg/dL — ABNORMAL LOW (ref 8–23)
CO2: 24 mmol/L (ref 22–32)
Calcium: 7.9 mg/dL — ABNORMAL LOW (ref 8.9–10.3)
Chloride: 108 mmol/L (ref 98–111)
Creatinine, Ser: 0.54 mg/dL — ABNORMAL LOW (ref 0.61–1.24)
GFR, Estimated: >60 mL/min (ref >60)
Glucose, Bld: 97 mg/dL (ref 70–99)
Potassium: 3.3 mmol/L — ABNORMAL LOW (ref 3.5–5.1)
Sodium: 139 mmol/L (ref 135–145)

## 2021-02-07 LAB — BILIRUBIN, FRACTIONATED(TOT/DIR/INDIR)
Bilirubin, Direct: 0.1 mg/dL (ref 0.0–0.2)
Indirect Bilirubin: 0.3 mg/dL (ref 0.3–0.9)
Total Bilirubin: 0.4 mg/dL (ref 0.3–1.2)

## 2021-02-07 LAB — CBC
HCT: 22.2 % — ABNORMAL LOW (ref 39.0–52.0)
Hemoglobin: 7.4 g/dL — ABNORMAL LOW (ref 13.0–17.0)
MCH: 32.2 pg (ref 26.0–34.0)
MCHC: 33.3 g/dL (ref 30.0–36.0)
MCV: 96.5 fL (ref 80.0–100.0)
Platelets: 185 10*3/uL (ref 150–400)
RBC: 2.3 MIL/uL — ABNORMAL LOW (ref 4.22–5.81)
RDW: 15.6 % — ABNORMAL HIGH (ref 11.5–15.5)
WBC: 5.2 10*3/uL (ref 4.0–10.5)
nRBC: 0.4 % — ABNORMAL HIGH (ref 0.0–0.2)

## 2021-02-07 LAB — MAGNESIUM: Magnesium: 1.4 mg/dL — ABNORMAL LOW (ref 1.7–2.4)

## 2021-02-07 LAB — LACTATE DEHYDROGENASE: LDH: 121 U/L (ref 98–192)

## 2021-02-07 LAB — OCCULT BLOOD X 1 CARD TO LAB, STOOL: Fecal Occult Bld: NEGATIVE

## 2021-02-07 LAB — HEMOGLOBIN AND HEMATOCRIT, BLOOD
HCT: 22.7 % — ABNORMAL LOW (ref 39.0–52.0)
Hemoglobin: 7.6 g/dL — ABNORMAL LOW (ref 13.0–17.0)

## 2021-02-07 LAB — PROCALCITONIN: Procalcitonin: <0.1 ng/mL

## 2021-02-07 SURGERY — RIGHT/LEFT HEART CATH AND CORONARY ANGIOGRAPHY
Anesthesia: Moderate Sedation

## 2021-02-07 MED ORDER — LOSARTAN POTASSIUM 25 MG PO TABS
12.5000 mg | ORAL_TABLET | Freq: Every day | ORAL | Status: DC
Start: 2021-02-07 — End: 2021-02-08
  Administered 2021-02-07: 10:00:00 12.5 mg via ORAL
  Filled 2021-02-07: qty 1

## 2021-02-07 MED ORDER — SODIUM CHLORIDE 0.9 % IV BOLUS
250.0000 mL | Freq: Once | INTRAVENOUS | Status: AC
  Administered 2021-02-07: 15:00:00 250 mL via INTRAVENOUS

## 2021-02-07 MED ORDER — MAGNESIUM SULFATE 2 GM/50ML IV SOLN
2.0000 g | Freq: Once | INTRAVENOUS | Status: AC
Start: 2021-02-07 — End: 2021-02-07
  Administered 2021-02-07: 17:00:00 2 g via INTRAVENOUS
  Filled 2021-02-07: qty 50

## 2021-02-07 MED ORDER — POTASSIUM CHLORIDE CRYS ER 20 MEQ PO TBCR
40.0000 meq | EXTENDED_RELEASE_TABLET | ORAL | Status: AC
  Administered 2021-02-07 (×2): 40 meq via ORAL
  Filled 2021-02-07 (×2): qty 2

## 2021-02-07 NOTE — Progress Notes (Addendum)
PROGRESS NOTE    Charles Schmitt.  ZOX:[id number] DOB: [birth year] DOA: 02/04/2021 PCP: Oneita Hurt, No   Brief Narrative: Charles Liddy. is a 65 y.o. male with a history of hypertension, hyperlipidemia, and alcohol abuse. Patient presented secondary to weakness and dyspnea and was found to have evidence of alcoholic ketoacidosis and associated lactic acidosis. IV fluids initiated with improvement. While in the ED, he also had an episode of atrial fibrillation but has now converted back to sinus rhythm. Transthoracic Echocardiogram obtained this admission shows new cardiomyopathy. Cardiology consulted with plan for heart catheterization which may be postponed for outpatient workup secondary to anemia.   Assessment & Plan:   * Alcoholic ketoacidosis-resolved as of 02/05/2021, (present on admission) Patient given IV fluids with resolution.  Alcohol use -Continue CIWA, thiamine, MVI, folic acid  Hyperglycemia Secondary to D5 IV fluids. Hemoglobin A1C of 4.8%.  Hypokalemia Normal magnesium. -Potassium supplementation  Chronic systolic heart failure (HCC) Newly diagnosed. EF of 30-35% on Transthoracic Echocardiogram from 12/27.  -Daily weights -Strict in/out -Cardiology recommendations: plan for LHC, possibly as an outpatient secondary to anemia  Bone lesion Sclerotic lesion of right iliac bone. Associated weight loss. PSA ordered and is normal. Asymptomatic. Possible Pagets.  Sinus tachycardia Concern for atrial fibrillation noted in ED. Mention of conversion to sinus rhythm after diltiazem and IV fluids. Transitioned from Diltiazem to metoprolol. Transthoracic Echocardiogram significant for severely reduced EF. Normal atria sizes. Cardiology consulted and after review, no evidence of atrial fibrillation but rather sinus tachycardia with PACs. -Telemetry -Cardiology recommendations: beta-blocker  Pressure injury of skin- (present on admission) Right coccyx.  Essential  hypertension- (present on admission) -Continue metoprolol  Anemia- (present on admission) No baseline. Iron panel suggests anemia of chronic disease. Normal vitamin B12 level. Unsure of etiology. Patient states he has had a colonoscopy earlier this year that was normal. Hemoglobin drifting down. No overt signs of bleeding. -CBC daily -FOBT obtained and pending -LDH, haptoglobin, fractionated bilirubin  Multifocal pneumonia- (present on admission) CT imaging with patchy infiltrates possibly secondary to aspiration. Empirically started on Ceftriaxone and azithromycin. -Continue Ceftriaxone and Azithromycin  Severe sepsis (HCC)- (present on admission) Present on admission  Nausea vomiting and diarrhea-resolved as of 02/06/2021, (present on admission) Initial concern for GI pathogen etiology. No recurrent diarrhea while inpatient.  Lactic acidosis-resolved as of 02/06/2021, (present on admission) Appears to possibly be secondary to hypovolemia in addition to ketoacidosis. IV fluid resuscitation given. Improved and stable.     DVT prophylaxis: SCDs Code Status:   Code Status: Full Code Family Communication: None at bedside. Friend, Ms. Cousin on telephone (12/29) Disposition Plan: Discharge home vs SNF pending improvement/stability of anemia, cardiology consult for new cardiomyopathy. PT recommending supervision which patient may or may not have but patient would like discharge home when appropriate   Consultants:  Cardiolgy  Procedures:  TRANSTHORACIC ECHOCARDIOGRAM (02/04/2021) IMPRESSIONS     1. Left ventricular ejection fraction, by estimation, is 30 to 35%. The  left ventricle has moderate to severely decreased function. The left  ventricle demonstrates global hypokinesis. Left ventricular diastolic  parameters are indeterminate. There is  akinesis of the left ventricular, apical segment.   2. Right ventricular systolic function is mildly reduced. The right  ventricular  size is normal.   3. The mitral valve is normal in structure. No evidence of mitral valve  regurgitation.   4. The aortic valve was not well visualized. Aortic valve regurgitation  is not visualized.   Antimicrobials: None  Subjective: No concerns today. No chest pain or dyspnea.  Objective: Vitals:   02/07/21 0407 02/07/21 0903 02/07/21 1203 02/07/21 1431  BP:  104/79 91/71 (!) 86/69  Pulse:  (!) 106 (!) 105 (!) 111  Resp:  16 18 18   Temp:  98.2 F (36.8 C) 98.4 F (36.9 C) 98.2 F (36.8 C)  TempSrc:    Oral  SpO2:  100% 100% 100%  Weight: 58 kg     Height:        Intake/Output Summary (Last 24 hours) at 02/07/2021 1505 Last data filed at 02/06/2021 2145 Gross per 24 hour  Intake 240 ml  Output 150 ml  Net 90 ml    Filed Weights   02/04/21 0732 02/06/21 0500 02/07/21 0407  Weight: 68 kg 57.9 kg 58 kg    Examination:  General exam: Appears calm and comfortable Respiratory system: Clear to auscultation. Respiratory effort normal. Cardiovascular system: S1 & S2 heard, RRR. Gastrointestinal system: Abdomen is nondistended, soft and nontender. No organomegaly or masses felt. Normal bowel sounds heard. Central nervous system: Alert and oriented. No focal neurological deficits. Musculoskeletal: No edema. No calf tenderness Skin: No cyanosis. No rashes Psychiatry: Judgement and insight appear normal. Mood & affect appropriate.     Data Reviewed: I have personally reviewed following labs and imaging studies  CBC Lab Results  Component Value Date   WBC 5.2 02/07/2021   RBC 2.30 (L) 02/07/2021   HGB 7.4 (L) 02/07/2021   HCT 22.2 (L) 02/07/2021   MCV 96.5 02/07/2021   MCH 32.2 02/07/2021   PLT 185 02/07/2021   MCHC 33.3 02/07/2021   RDW 15.6 (H) 02/07/2021   LYMPHSABS 0.5 (L) 02/04/2021   MONOABS 0.2 02/04/2021   EOSABS 0.0 02/04/2021   BASOSABS 0.0 02/04/2021     Last metabolic panel Lab Results  Component Value Date   NA 139 02/07/2021   K 3.3  (L) 02/07/2021   CL 108 02/07/2021   CO2 24 02/07/2021   BUN <5 (L) 02/07/2021   CREATININE 0.54 (L) 02/07/2021   GLUCOSE 97 02/07/2021   GFRNONAA >60 02/07/2021   CALCIUM 7.9 (L) 02/07/2021   PHOS 4.1 02/04/2021   PROT 6.6 02/05/2021   ALBUMIN 2.3 (L) 02/05/2021   BILITOT 0.9 02/05/2021   ALKPHOS 85 02/05/2021   AST 28 02/05/2021   ALT 18 02/05/2021   ANIONGAP 7 02/07/2021    CBG (last 3)  No results for input(s): GLUCAP in the last 72 hours.    GFR: Estimated Creatinine Clearance: 77.5 mL/min (A) (by C-G formula based on SCr of 0.54 mg/dL (L)).  Coagulation Profile: No results for input(s): INR, PROTIME in the last 168 hours.  Recent Results (from the past 240 hour(s))  Resp Panel by RT-PCR (Flu A&B, Covid) Nasopharyngeal Swab     Status: None   Collection Time: 02/04/21  8:30 AM   Specimen: Nasopharyngeal Swab; Nasopharyngeal(NP) swabs in vial transport medium  Result Value Ref Range Status   SARS Coronavirus 2 by RT PCR NEGATIVE NEGATIVE Final    Comment: (NOTE) SARS-CoV-2 target nucleic acids are NOT DETECTED.  The SARS-CoV-2 RNA is generally detectable in upper respiratory specimens during the acute phase of infection. The lowest concentration of SARS-CoV-2 viral copies this assay can detect is 138 copies/mL. A negative result does not preclude SARS-Cov-2 infection and should not be used as the sole basis for treatment or other patient management decisions. A negative result may occur with  improper specimen collection/handling, submission of specimen  other than nasopharyngeal swab, presence of viral mutation(s) within the areas targeted by this assay, and inadequate number of viral copies(<138 copies/mL). A negative result must be combined with clinical observations, patient history, and epidemiological information. The expected result is Negative.  Fact Sheet for Patients:  BloggerCourse.com  Fact Sheet for Healthcare Providers:   SeriousBroker.it  This test is no t yet approved or cleared by the [place] FDA and  has been authorized for detection and/or diagnosis of SARS-CoV-2 by FDA under an Emergency Use Authorization (EUA). This EUA will remain  in effect (meaning this test can be used) for the duration of the COVID-19 declaration under Section 564(b)(1) of the Act, 21 U.S.C.section 360bbb-3(b)(1), unless the authorization is terminated  or revoked sooner.       Influenza A by PCR NEGATIVE NEGATIVE Final   Influenza B by PCR NEGATIVE NEGATIVE Final    Comment: (NOTE) The Xpert Xpress SARS-CoV-2/FLU/RSV plus assay is intended as an aid in the diagnosis of influenza from Nasopharyngeal swab specimens and should not be used as a sole basis for treatment. Nasal washings and aspirates are unacceptable for Xpert Xpress SARS-CoV-2/FLU/RSV testing.  Fact Sheet for Patients: BloggerCourse.com  Fact Sheet for Healthcare Providers: SeriousBroker.it  This test is not yet approved or cleared by the [place] FDA and has been authorized for detection and/or diagnosis of SARS-CoV-2 by FDA under an Emergency Use Authorization (EUA). This EUA will remain in effect (meaning this test can be used) for the duration of the COVID-19 declaration under Section 564(b)(1) of the Act, 21 U.S.C. section 360bbb-3(b)(1), unless the authorization is terminated or revoked.  Performed at [facility], [address] [place], [place] [address]   Culture, blood (single)     Status: None (Preliminary result)   Collection Time: 02/04/21 11:39 AM   Specimen: BLOOD  Result Value Ref Range Status   Specimen Description BLOOD BLOOD LEFT FOREARM  Final   Special Requests   Final    BOTTLES DRAWN AEROBIC ONLY Blood Culture results may not be optimal due to an inadequate volume of blood received in culture bottles   Culture   Final    NO  GROWTH 3 DAYS Performed at [facility], [address], [place], [place] [address]    Report Status PENDING  Incomplete  Culture, blood (routine x 2)     Status: None (Preliminary result)   Collection Time: 02/04/21  1:04 PM   Specimen: BLOOD  Result Value Ref Range Status   Specimen Description BLOOD RIGHT ANTECUBITAL  Final   Special Requests   Final    BOTTLES DRAWN AEROBIC AND ANAEROBIC Blood Culture results may not be optimal due to an inadequate volume of blood received in culture bottles   Culture   Final    NO GROWTH 3 DAYS Performed at [facility], [address], [place], [place] [address]    Report Status PENDING  Incomplete  Respiratory (~20 pathogens) panel by PCR     Status: None   Collection Time: 02/05/21 12:08 PM   Specimen: Nasopharyngeal Swab; Respiratory  Result Value Ref Range Status   Adenovirus NOT DETECTED NOT DETECTED Final   Coronavirus 229E NOT DETECTED NOT DETECTED Final    Comment: (NOTE) The Coronavirus on the Respiratory Panel, DOES NOT test for the novel  Coronavirus (2019 nCoV)    Coronavirus HKU1 NOT DETECTED NOT DETECTED Final   Coronavirus NL63 NOT DETECTED NOT DETECTED Final   Coronavirus OC43 NOT DETECTED  NOT DETECTED Final   Metapneumovirus NOT DETECTED NOT DETECTED Final   Rhinovirus / Enterovirus NOT DETECTED NOT DETECTED Final   Influenza A NOT DETECTED NOT DETECTED Final   Influenza B NOT DETECTED NOT DETECTED Final   Parainfluenza Virus 1 NOT DETECTED NOT DETECTED Final   Parainfluenza Virus 2 NOT DETECTED NOT DETECTED Final   Parainfluenza Virus 3 NOT DETECTED NOT DETECTED Final   Parainfluenza Virus 4 NOT DETECTED NOT DETECTED Final   Respiratory Syncytial Virus NOT DETECTED NOT DETECTED Final   Bordetella pertussis NOT DETECTED NOT DETECTED Final   Bordetella Parapertussis NOT DETECTED NOT DETECTED Final   Chlamydophila pneumoniae NOT DETECTED NOT DETECTED Final   Mycoplasma pneumoniae NOT DETECTED NOT  DETECTED Final    Comment: Performed at [facility], [address] [place], [place] [address]  Gastrointestinal Panel by PCR , Stool     Status: None   Collection Time: 02/07/21 12:11 PM   Specimen: Stool  Result Value Ref Range Status   Campylobacter species NOT DETECTED NOT DETECTED Final   Plesimonas shigelloides NOT DETECTED NOT DETECTED Final   Salmonella species NOT DETECTED NOT DETECTED Final   Yersinia enterocolitica NOT DETECTED NOT DETECTED Final   Vibrio species NOT DETECTED NOT DETECTED Final   Vibrio cholerae NOT DETECTED NOT DETECTED Final   Enteroaggregative E coli (EAEC) NOT DETECTED NOT DETECTED Final   Enteropathogenic E coli (EPEC) NOT DETECTED NOT DETECTED Final   Enterotoxigenic E coli (ETEC) NOT DETECTED NOT DETECTED Final   Shiga like toxin producing E coli (STEC) NOT DETECTED NOT DETECTED Final   Shigella/Enteroinvasive E coli (EIEC) NOT DETECTED NOT DETECTED Final   Cryptosporidium NOT DETECTED NOT DETECTED Final   Cyclospora cayetanensis NOT DETECTED NOT DETECTED Final   Entamoeba histolytica NOT DETECTED NOT DETECTED Final   Giardia lamblia NOT DETECTED NOT DETECTED Final   Adenovirus F40/41 NOT DETECTED NOT DETECTED Final   Astrovirus NOT DETECTED NOT DETECTED Final   Norovirus GI/GII NOT DETECTED NOT DETECTED Final   Rotavirus A NOT DETECTED NOT DETECTED Final   Sapovirus (I, II, IV, and V) NOT DETECTED NOT DETECTED Final    Comment: Performed at [facility], [address] [place], [place] [address]         Radiology Studies: No results found.      Scheduled Meds:  folic acid  1 mg Oral Daily   losartan  12.5 mg Oral Daily   metoprolol tartrate  12.5 mg Oral BID   multivitamin with minerals  1 tablet Oral Daily   potassium chloride  40 mEq Oral Q4H   sodium chloride flush  3 mL Intravenous Q12H   thiamine  100 mg Oral Daily   Or   thiamine  100 mg Intravenous Daily   Continuous Infusions:  azithromycin 500 mg  (02/07/21 1033)   cefTRIAXone (ROCEPHIN)  IV 2 g (02/07/21 1034)   sodium chloride       LOS: 3 days     Jacquelin Hawking, MD [facility] 02/07/2021, 3:05 PM  If 7PM-7AM, please contact night-coverage [email or web address]

## 2021-02-07 NOTE — Assessment & Plan Note (Signed)
Present on admission

## 2021-02-07 NOTE — Progress Notes (Signed)
°   02/07/21 1203  Assess: MEWS Score  Temp 98.4 F (36.9 C)  BP 91/71  Pulse Rate (!) 105  ECG Heart Rate (!) 106  Resp 18  SpO2 100 %  O2 Device Room Air  Assess: MEWS Score  MEWS Temp 0  MEWS Systolic 1  MEWS Pulse 1  MEWS RR 0  MEWS LOC 0  MEWS Score 2  MEWS Score Color Yellow  Assess: if the MEWS score is Yellow or Red  Were vital signs taken at a resting state? Yes  Focused Assessment No change from prior assessment  Does the patient meet 2 or more of the SIRS criteria? No  MEWS guidelines implemented *See Row Information* Yes  Treat  Pain Scale 0-10  Pain Score 0  Take Vital Signs  Increase Vital Sign Frequency  Yellow: Q 2hr X 2 then Q 4hr X 2, if remains yellow, continue Q 4hrs  Escalate  MEWS: Escalate Yellow: discuss with charge nurse/RN and consider discussing with provider and RRT  Notify: Charge Nurse/RN  Name of Charge Nurse/RN Notified Jessica RN  Date Charge Nurse/RN Notified 02/07/21  Time Charge Nurse/RN Notified 1215  Document  Patient Outcome Not stable and remains on department  Progress note created (see row info) Yes  Assess: SIRS CRITERIA  SIRS Temperature  0  SIRS Pulse 1  SIRS Respirations  0  SIRS WBC 0  SIRS Score Sum  1

## 2021-02-07 NOTE — TOC Initial Note (Signed)
Transition of Care (TOC) - Initial/Assessment Note    Patient Details  Name: Charles Schmitt. MRN: [id number] Date of Birth: March 03, 1955  Transition of Care [facility]) CM/SW Contact:    Alberteen Sam, LCSW Phone Number: 02/07/2021, 2:37 PM  Clinical Narrative:                  CSW met with patient at bedside, reports having home support of his friend [name] and family support at home. Reports no concerns regarding going home at discharge. Patient agreeable to medications being sent to med management at discharge due to having no insurance. Reports no DME needs and declines substance abuse resources at this time.   TOC will continue to follow for potential needs that may arise.    Expected Discharge Plan: Home/Self Care Barriers to Discharge: Continued Medical Work up   Patient Goals and CMS Choice Patient states their goals for this hospitalization and ongoing recovery are:: to go home CMS Medicare.gov Compare Post Acute Care list provided to:: Patient Choice offered to / list presented to : Patient  Expected Discharge Plan and Services Expected Discharge Plan: Home/Self Care       Living arrangements for the past 2 months: Single Family Home                                      Prior Living Arrangements/Services Living arrangements for the past 2 months: Single Family Home Lives with:: Relatives                   Activities of Daily Living Home Assistive Devices/Equipment: None ADL Screening (condition at time of admission) Patient's cognitive ability adequate to safely complete daily activities?: No Is the patient deaf or have difficulty hearing?: No Does the patient have difficulty seeing, even when wearing glasses/contacts?: No Does the patient have difficulty concentrating, remembering, or making decisions?: Yes Patient able to express need for assistance with ADLs?: Yes Does the patient have difficulty dressing or bathing?: No Independently performs  ADLs?: No Communication: Independent Dressing (OT): Needs assistance Is this a change from baseline?: Change from baseline, expected to last <3days Grooming: Needs assistance Is this a change from baseline?: Change from baseline, expected to last <3 days Feeding: Independent Bathing: Needs assistance Is this a change from baseline?: Change from baseline, expected to last <3 days Toileting: Needs assistance Is this a change from baseline?: Change from baseline, expected to last <3 days In/Out Bed: Needs assistance Is this a change from baseline?: Change from baseline, expected to last <3 days Walks in Home: Needs assistance Is this a change from baseline?: Change from baseline, expected to last <3 days Does the patient have difficulty walking or climbing stairs?: Yes Weakness of Legs: Both Weakness of Arms/Hands: None  Permission Sought/Granted                  Emotional Assessment       Orientation: : Oriented to Self, Oriented to Place, Oriented to  Time, Oriented to Situation Alcohol / Substance Use: Not Applicable Psych Involvement: No (comment)  Admission diagnosis:  Alcoholic ketoacidosis [N86.76] Inanition ([place]) [R64] Weakness [R53.1] Elevated lactic acid level [R79.89] Community acquired pneumonia, unspecified laterality [J18.9] Patient Active Problem List   Diagnosis Date Noted   Pressure injury of skin 02/05/2021   Sinus tachycardia 02/05/2021   Bone lesion 72/10/4707   Chronic systolic heart failure ([place]) 02/05/2021  Hyperglycemia 02/05/2021   Alcohol use 02/05/2021   Severe sepsis ([place])    Multifocal pneumonia    Anemia    Essential hypertension    PCP:  Pcp, No Pharmacy:  No Pharmacies Listed    Social Determinants of Health (SDOH) Interventions    Readmission Risk Interventions No flowsheet data found.

## 2021-02-07 NOTE — Progress Notes (Signed)
Progress Note  Patient Name: Charles Schmitt. Date of Encounter: 02/07/2021  Primary Cardiologist: Yvonne Kendall, MD  Subjective   Feels well this morning.  Notes significant improvement in energy.  No chest pain or dyspnea.  Inpatient Medications    Scheduled Meds:  folic acid  1 mg Oral Daily   losartan  12.5 mg Oral Daily   metoprolol tartrate  12.5 mg Oral BID   multivitamin with minerals  1 tablet Oral Daily   sodium chloride flush  3 mL Intravenous Q12H   thiamine  100 mg Oral Daily   Or   thiamine  100 mg Intravenous Daily   Continuous Infusions:  azithromycin 500 mg (02/07/21 1033)   cefTRIAXone (ROCEPHIN)  IV 2 g (02/07/21 1034)   PRN Meds: acetaminophen **OR** acetaminophen, guaiFENesin-dextromethorphan, ondansetron **OR** ondansetron (ZOFRAN) IV, sodium chloride   Vital Signs    Vitals:   02/07/21 0357 02/07/21 0407 02/07/21 0903 02/07/21 1203  BP: 101/69  104/79 91/71  Pulse: (!) 102  (!) 106 (!) 105  Resp: 16  16 18   Temp: 98.4 F (36.9 C)  98.2 F (36.8 C) 98.4 F (36.9 C)  TempSrc:      SpO2: 100%  100% 100%  Weight:  58 kg    Height:        Intake/Output Summary (Last 24 hours) at 02/07/2021 1322 Last data filed at 02/06/2021 2145 [place] per 24 hour  Intake 240 ml  Output 150 ml  Net 90 ml   Filed Weights   02/04/21 0732 02/06/21 0500 02/07/21 0407  Weight: 68 kg 57.9 kg 58 kg    Physical Exam   GEN: Thin, frail, in no acute distress.  HEENT: Grossly normal.  Neck: Supple, no JVD, carotid bruits, or masses. Cardiac: RRR, no murmurs, rubs, or gallops. No clubbing, cyanosis, edema.  Radials 2+, DP/PT 2+ and equal bilaterally.  Respiratory:  Respirations regular and unlabored, diminished breath sounds bilaterally. GI: Soft, nontender, nondistended, BS + x 4. MS: no deformity or atrophy. Skin: warm and dry, no rash. Neuro:  Strength and sensation are intact. Psych: AAOx3.  Normal affect.  Labs    Chemistry Recent Labs  Lab  02/04/21 [phone] 02/04/21 1712 02/05/21 0453 02/05/21 1651 02/06/21 0610 02/07/21 0724  NA 144   < > 139  --  140 139  K 4.2   < > 2.9* 3.1* 3.8 3.3*  CL 105   < > 109  --  112* 108  CO2 10*   < > 21*  --  22 24  GLUCOSE 128*   < > 213*  --  97 97  BUN 9   < > 6*  --  <5* <5*  CREATININE 0.94   < > 0.64  --  0.50* 0.54*  CALCIUM 9.0   < > 8.1*  --  8.2* 7.9*  PROT 8.5*  --  6.6  --   --   --   ALBUMIN 2.7*  --  2.3*  --   --   --   AST 54*  --  28  --   --   --   ALT 26  --  18  --   --   --   ALKPHOS 116  --  85  --   --   --   BILITOT 2.4*  --  0.9  --   --   --   GFRNONAA >60   < > >60  --  >60 >60  ANIONGAP 29*   < > 9  --  6 7   < > = values in this interval not displayed.     Hematology Recent Labs  Lab 02/05/21 0453 02/05/21 1919 02/06/21 0610 02/07/21 0724  WBC 8.8  --  7.1 5.2  RBC 2.21*  --  2.35* 2.30*  HGB 7.4* 8.7* 7.8* 7.4*  HCT 21.9* 26.5* 23.2* 22.2*  MCV 99.1  --  98.7 96.5  MCH 33.5  --  33.2 32.2  MCHC 33.8  --  33.6 33.3  RDW 15.4  --  15.8* 15.6*  PLT 271  --  217 185   HbA1c  Lab Results  Component Value Date   HGBA1C 4.8 02/05/2021    Radiology    CT ABDOMEN PELVIS W CONTRAST  Result Date: 02/04/2021 CLINICAL DATA:  Weakness, possible pneumonia EXAM: CT ABDOMEN AND PELVIS WITH CONTRAST TECHNIQUE: Multidetector CT imaging of the abdomen and pelvis was performed using the standard protocol following bolus administration of intravenous contrast. CONTRAST:  161mL OMNIPAQUE IOHEXOL 300 MG/ML  SOLN COMPARISON:  None. FINDINGS: Lower chest: There are patchy opacities in the bilateral lung bases, right worse than left. The imaged heart is unremarkable. Hepatobiliary: The liver is diffusely hypoattenuating consistent with marked fatty infiltration. There are no focal lesions. The gallbladder is unremarkable. There is no biliary ductal dilatation. Pancreas: Unremarkable. Spleen: Unremarkable. Adrenals/Urinary Tract: Adrenals are unremarkable. The  kidneys are unremarkable, with no focal lesion, stone, hydronephrosis, or hydroureter. The bladder is unremarkable. Stomach/Bowel: The stomach is unremarkable. There is no evidence of bowel obstruction. There is no abnormal bowel wall thickening or inflammatory change. There is colonic diverticulosis without evidence of acute diverticulitis. The appendix is normal. Vascular/Lymphatic: There is scattered calcified atherosclerotic plaque throughout the nonaneurysmal abdominal aorta. The major branch vessels are patent. The main portal and splenic veins are patent. There is no abdominal or pelvic lymphadenopathy. Reproductive: Prostate is enlarged measuring up to 5.3 cm transverse. The seminal vesicles are unremarkable. Other: There is no ascites or free air. Musculoskeletal: There are subacute appearing fractures of the right sixth rib laterally and twelfth rib posteriorly. There is a 7 mm sclerotic lesion in the right iliac bone adjacent to the SI joint with a thin rim of surrounding lucency. IMPRESSION: 1. Patchy opacities in the lung bases, right more than left, may reflect infection or aspiration. 2. No acute findings in the abdomen or pelvis. 3. Subacute appearing fractures of the right sixth and twelfth ribs. 4. Severe fatty infiltration of the liver. 5. Diverticulosis without evidence of acute diverticulitis. 6. Prostatomegaly. 7. Indeterminate sclerotic lesion in the right iliac bone measuring approximately 7 mm. Correlation with any prior imaging would be helpful, if available, and correlate with PSA. In the absence of prior imaging, six-month follow-up CT could be considered to ensure stability. 8.  Aortic Atherosclerosis (ICD10-I70.0). Electronically Signed   By: Valetta Mole M.D.   On: 02/04/2021 09:49   DG Chest Portable 1 View  Result Date: 02/04/2021 CLINICAL DATA:  Weakness EXAM: PORTABLE CHEST 1 VIEW COMPARISON:  None. FINDINGS: The cardiomediastinal silhouette is normal. There is no focal  consolidation or pulmonary edema. There is no pleural effusion or pneumothorax. There is no acute osseous abnormality. IMPRESSION: No radiographic evidence of acute cardiopulmonary process. Electronically Signed   By: Valetta Mole M.D.   On: 02/04/2021 08:30   Telemetry    Sinus tachycardia in the low 100s, occasional PVCs- Personally Reviewed  Cardiac Studies   2D Echocardiogram 12.27.2022  1. Left ventricular ejection fraction, by estimation, is 30 to 35%. The  left ventricle has moderate to severely decreased function. The left  ventricle demonstrates global hypokinesis. Left ventricular diastolic  parameters are indeterminate. There is  akinesis of the left ventricular, apical segment.   2. Right ventricular systolic function is mildly reduced. The right  ventricular size is normal.   3. The mitral valve is normal in structure. No evidence of mitral valve  regurgitation.   4. The aortic valve was not well visualized. Aortic valve regurgitation  is not visualized.   Patient Profile      65 y.o. male with a history of hypertension, hyperlipidemia, tobacco abuse, and alcohol abuse, who was admitted December 27 with dyspnea and edema, and found to have LV dysfunction with an EF of 30 to 35%.  Assessment & Plan    1.  Cardiomyopathy: Patient with a history of alcoholism admitted December 27 with progressive dyspnea and edema.  Echo has shown an EF of 30 to 35% with global hypokinesis and apical akinesis.  RV function is mildly reduced.  Initially, we had considered right and left heart diagnostic catheterization today however, in the setting of ongoing normocytic anemia with a hemoglobin of 7.4 and hematocrit of 22.2, will defer invasive evaluation at this time.  His blood pressures have been relatively stable we have added low-dose losartan on top of low-dose metoprolol.  Ideally, in the setting of LV dysfunction, would like to transition to Toprol-XL 25 mg daily.  He is a poor candidate  for SGLT2 inhibitor in the setting of admission with acidosis and hemoglobin A1c of 4.8.  We will plan to follow-up as an outpatient and consider invasive versus noninvasive ischemic testing dependent upon his recovery and stability of anemia.  2.  Normocytic anemia: Likely secondary to chronic disease and alcoholism.  H&H lower today at 7.4 and 22.2.  Hemodynamically stable and asymptomatic.  3.  Hypotension: Blood pressure slightly improved over the past 24 hours.  Follow.  4.  Sinus tachycardia: This was initially called atrial fibrillation on ECG in the emergency department however, review shows sinus tachycardia with PAC and baseline artifact.  5.  Alcohol abuse: Cessation advised.  6.  Tobacco abuse: Cessation advised.  7.  Right iliac lesion: Incidentally noted on CT of the abdomen and pelvis.  PSA was normal.  Further evaluation and follow-up per medicine.  8.  Alcoholic ketoacidosis: Lactate improved with IV fluids.  Patient notes significant improvement in strength.  Signed, Murray Hodgkins, NP  02/07/2021, 1:22 PM    For questions or updates, please contact   Please consult [email or web address] for contact info under Cardiology/STEMI.

## 2021-02-07 NOTE — Progress Notes (Signed)
PT Cancellation Note  Patient Details Name: Charles Schmitt. MRN: [id number] DOB: 1955-10-08   Cancelled Treatment:    Reason Eval/Treat Not Completed: Patient declined, no reason specified Pt with low BP with nurse reporting he is receiving a fluid bolus but cleared him for bed level exercises. Pt received in bed with visitors present with pt declining participation at this time. Will f/u as able.  Aleda Grana, PT, DPT 02/07/21, 3:22 PM    Sandi Mariscal 02/07/2021, 3:21 PM

## 2021-02-08 DIAGNOSIS — R636 Underweight: Secondary | ICD-10-CM

## 2021-02-08 LAB — BASIC METABOLIC PANEL
Anion gap: 4 — ABNORMAL LOW (ref 5–15)
BUN: <5 mg/dL — ABNORMAL LOW (ref 8–23)
CO2: 24 mmol/L (ref 22–32)
Calcium: 7.7 mg/dL — ABNORMAL LOW (ref 8.9–10.3)
Chloride: 107 mmol/L (ref 98–111)
Creatinine, Ser: 0.55 mg/dL — ABNORMAL LOW (ref 0.61–1.24)
GFR, Estimated: >60 mL/min (ref >60)
Glucose, Bld: 98 mg/dL (ref 70–99)
Potassium: 3.9 mmol/L (ref 3.5–5.1)
Sodium: 135 mmol/L (ref 135–145)

## 2021-02-08 LAB — CBC
HCT: 21.3 % — ABNORMAL LOW (ref 39.0–52.0)
Hemoglobin: 7.1 g/dL — ABNORMAL LOW (ref 13.0–17.0)
MCH: 32.6 pg (ref 26.0–34.0)
MCHC: 33.3 g/dL (ref 30.0–36.0)
MCV: 97.7 fL (ref 80.0–100.0)
Platelets: 174 10*3/uL (ref 150–400)
RBC: 2.18 MIL/uL — ABNORMAL LOW (ref 4.22–5.81)
RDW: 15.4 % (ref 11.5–15.5)
WBC: 5.3 10*3/uL (ref 4.0–10.5)
nRBC: 0.4 % — ABNORMAL HIGH (ref 0.0–0.2)

## 2021-02-08 LAB — MAGNESIUM: Magnesium: 1.8 mg/dL (ref 1.7–2.4)

## 2021-02-08 LAB — ABO/RH: ABO/RH(D): O POS

## 2021-02-08 LAB — FOLATE: Folate: 5.2 ng/mL — ABNORMAL LOW (ref >5.9)

## 2021-02-08 LAB — PREPARE RBC (CROSSMATCH)

## 2021-02-08 MED ORDER — MELATONIN 5 MG PO TABS
10.0000 mg | ORAL_TABLET | Freq: Every evening | ORAL | Status: DC | PRN
  Administered 2021-02-08: 10 mg via ORAL

## 2021-02-08 MED ORDER — SODIUM CHLORIDE 0.9% IV SOLUTION: Freq: Once | INTRAVENOUS | Status: AC

## 2021-02-08 MED ORDER — METOPROLOL SUCCINATE ER 25 MG PO TB24
12.5000 mg | ORAL_TABLET | Freq: Every day | ORAL | Status: DC
  Administered 2021-02-08 – 2021-02-10 (×3): 12.5 mg via ORAL
  Filled 2021-02-08 (×3): qty 1

## 2021-02-08 NOTE — Progress Notes (Signed)
PROGRESS NOTE    Charles Schmitt.  YSA:[id number] DOB: 06-01-1955 DOA: 02/04/2021 PCP: Oneita Hurt, No   Brief Narrative: Magic Mohler. is a 65 y.o. male with a history of hypertension, hyperlipidemia, and alcohol abuse. Patient presented secondary to weakness and dyspnea and was found to have evidence of alcoholic ketoacidosis and associated lactic acidosis. IV fluids initiated with improvement. While in the ED, he also had an episode of atrial fibrillation but has now converted back to sinus rhythm. Transthoracic Echocardiogram obtained this admission shows new cardiomyopathy. Cardiology consulted with plan for heart catheterization which may be postponed for outpatient workup secondary to anemia.   Assessment & Plan:   * Alcoholic ketoacidosis-resolved as of 02/05/2021, (present on admission) Patient given IV fluids with resolution.  Underweight Body mass index is 15.82 kg/m. -Dietitian consult  Alcohol use -Continue CIWA, thiamine, MVI, folic acid  Hyperglycemia Secondary to D5 IV fluids. Hemoglobin A1C of 4.8%.  Hypokalemia Normal magnesium. -Potassium supplementation as needed  Chronic systolic heart failure (HCC) Newly diagnosed. EF of 30-35% on Transthoracic Echocardiogram from 12/27.  -Daily weights -Strict in/out -Cardiology recommendations: plan for LHC, possibly as an outpatient secondary to anemia  Bone lesion Sclerotic lesion of right iliac bone. Associated weight loss. PSA ordered and is normal. Asymptomatic. Possible Pagets.  Sinus tachycardia Concern for atrial fibrillation noted in ED. Mention of conversion to sinus rhythm after diltiazem and IV fluids. Transitioned from Diltiazem to metoprolol. Transthoracic Echocardiogram significant for severely reduced EF. Normal atria sizes. Cardiology consulted and after review, no evidence of atrial fibrillation but rather sinus tachycardia with PACs. -Telemetry -Cardiology recommendations:  beta-blocker  Pressure injury of skin- (present on admission) Right coccyx.  Essential hypertension- (present on admission) -Continue metoprolol; dose reduced secondary to soft blood pressures  Anemia- (present on admission) No baseline. In setting of chronic alcohol intake. Iron panel suggests anemia of chronic disease. Normal vitamin B12 level. Unsure of etiology. Patient states he has had a colonoscopy earlier this year that was normal. No overt signs of bleeding. Normal bilirubin and LDH. Initial FOBT is negative. Hemoglobin continues to trend down -CBC daily -Check folate level -FOBT x2 more occurrences -Follow-up haptoglobin, smear review -Transfuse 1 unit of PRBC in light of newly diagnosed cardiomyopathy, possibly ischemic  Multifocal pneumonia- (present on admission) CT imaging with patchy infiltrates possibly secondary to aspiration. Empirically started on Ceftriaxone and azithromycin. -Continue Ceftriaxone and Azithromycin  Severe sepsis (HCC)- (present on admission) Present on admission  Nausea vomiting and diarrhea-resolved as of 02/06/2021, (present on admission) Initial concern for GI pathogen etiology. No recurrent diarrhea while inpatient.  Lactic acidosis-resolved as of 02/06/2021, (present on admission) Appears to possibly be secondary to hypovolemia in addition to ketoacidosis. IV fluid resuscitation given. Improved and stable.     DVT prophylaxis: SCDs Code Status:   Code Status: Full Code Family Communication: None at bedside. Friend, Ms. Cousin on telephone (12/29) Disposition Plan: Discharge home vs SNF likely in 1-2 days pending stable hemoglobin   Consultants:  Cardiolgy  Procedures:  TRANSTHORACIC ECHOCARDIOGRAM (02/04/2021) IMPRESSIONS     1. Left ventricular ejection fraction, by estimation, is 30 to 35%. The  left ventricle has moderate to severely decreased function. The left  ventricle demonstrates global hypokinesis. Left ventricular  diastolic  parameters are indeterminate. There is  akinesis of the left ventricular, apical segment.   2. Right ventricular systolic function is mildly reduced. The right  ventricular size is normal.   3. The mitral valve is normal in structure.  No evidence of mitral valve  regurgitation.   4. The aortic valve was not well visualized. Aortic valve regurgitation  is not visualized.   Antimicrobials: None    Subjective: No issues today. He has not noticed any bleeding.  Objective: Vitals:   02/07/21 2326 02/08/21 0454 02/08/21 0805 02/08/21 1135  BP: (!) 81/66  Pulse: (!) 106 (!) 108 (!) 110 (!) 110  Resp: Temp: 98.1 F (36.7 C) 98 F (36.7 C) 98.6 F (37 C) 98.6 F (37 C)  TempSrc:      SpO2: 100% 100% 97% 100%  Weight:  59 kg    Height:        Intake/Output Summary (Last 24 hours) at 02/08/2021 1358 Last data filed at 02/08/2021 1116 [place] per 24 hour  Intake 617.28 ml  Output 700 ml  Net -82.72 ml    Filed Weights   02/06/21 0500 02/07/21 0407 02/08/21 0454  Weight: 57.9 kg 58 kg 59 kg    Examination:  General exam: Appears calm and comfortable. Cachectic appearing. Respiratory system: Clear to auscultation. Respiratory effort normal. Cardiovascular system: S1 & S2 heard, RRR. Gastrointestinal system: Abdomen is nondistended, soft and nontender. No organomegaly or masses felt. Normal bowel sounds heard. Central nervous system: Alert and oriented. No focal neurological deficits. Musculoskeletal: No edema. No calf tenderness Skin: No cyanosis. No rashes Psychiatry: Judgement and insight appear normal. Mood & affect appropriate.     Data Reviewed: I have personally reviewed following labs and imaging studies  CBC Lab Results  Component Value Date   WBC 5.3 02/08/2021   RBC 2.18 (L) 02/08/2021   HGB 7.1 (L) 02/08/2021   HCT 21.3 (L) 02/08/2021   MCV 97.7 02/08/2021   MCH 32.6 02/08/2021   PLT 174 02/08/2021   MCHC  33.3 02/08/2021   RDW 15.4 02/08/2021   LYMPHSABS 0.5 (L) 02/04/2021   MONOABS 0.2 02/04/2021   EOSABS 0.0 02/04/2021   BASOSABS 0.0 02/04/2021     Last metabolic panel Lab Results  Component Value Date   NA 135 02/08/2021   K 3.9 02/08/2021   CL 107 02/08/2021   CO2 24 02/08/2021   BUN <5 (L) 02/08/2021   CREATININE 0.55 (L) 02/08/2021   GLUCOSE 98 02/08/2021   GFRNONAA >60 02/08/2021   CALCIUM 7.7 (L) 02/08/2021   PHOS 4.1 02/04/2021   PROT 6.6 02/05/2021   ALBUMIN 2.3 (L) 02/05/2021   BILITOT 0.4 02/07/2021   ALKPHOS 85 02/05/2021   AST 28 02/05/2021   ALT 18 02/05/2021   ANIONGAP 4 (L) 02/08/2021    CBG (last 3)  No results for input(s): GLUCAP in the last 72 hours.    GFR: Estimated Creatinine Clearance: 78.9 mL/min (A) (by C-G formula based on SCr of 0.55 mg/dL (L)).  Coagulation Profile: No results for input(s): INR, PROTIME in the last 168 hours.  Recent Results (from the past 240 hour(s))  Resp Panel by RT-PCR (Flu A&B, Covid) Nasopharyngeal Swab     Status: None   Collection Time: 02/04/21  8:30 AM   Specimen: Nasopharyngeal Swab; Nasopharyngeal(NP) swabs in vial transport medium  Result Value Ref Range Status   SARS Coronavirus 2 by RT PCR NEGATIVE NEGATIVE Final    Comment: (NOTE) SARS-CoV-2 target nucleic acids are NOT DETECTED.  The SARS-CoV-2 RNA is generally detectable in upper respiratory specimens during the acute phase of infection. The lowest concentration of SARS-CoV-2 viral copies this assay can detect is 138 copies/mL.  A negative result does not preclude SARS-Cov-2 infection and should not be used as the sole basis for treatment or other patient management decisions. A negative result may occur with  improper specimen collection/handling, submission of specimen other than nasopharyngeal swab, presence of viral mutation(s) within the areas targeted by this assay, and inadequate number of viral copies(<138 copies/mL). A negative result  must be combined with clinical observations, patient history, and epidemiological information. The expected result is Negative.  Fact Sheet for Patients:  BloggerCourse.com  Fact Sheet for Healthcare Providers:  SeriousBroker.it  This test is no t yet approved or cleared by the [place] FDA and  has been authorized for detection and/or diagnosis of SARS-CoV-2 by FDA under an Emergency Use Authorization (EUA). This EUA will remain  in effect (meaning this test can be used) for the duration of the COVID-19 declaration under Section 564(b)(1) of the Act, 21 U.S.C.section 360bbb-3(b)(1), unless the authorization is terminated  or revoked sooner.       Influenza A by PCR NEGATIVE NEGATIVE Final   Influenza B by PCR NEGATIVE NEGATIVE Final    Comment: (NOTE) The Xpert Xpress SARS-CoV-2/FLU/RSV plus assay is intended as an aid in the diagnosis of influenza from Nasopharyngeal swab specimens and should not be used as a sole basis for treatment. Nasal washings and aspirates are unacceptable for Xpert Xpress SARS-CoV-2/FLU/RSV testing.  Fact Sheet for Patients: BloggerCourse.com  Fact Sheet for Healthcare Providers: SeriousBroker.it  This test is not yet approved or cleared by the [place] FDA and has been authorized for detection and/or diagnosis of SARS-CoV-2 by FDA under an Emergency Use Authorization (EUA). This EUA will remain in effect (meaning this test can be used) for the duration of the COVID-19 declaration under Section 564(b)(1) of the Act, 21 U.S.C. section 360bbb-3(b)(1), unless the authorization is terminated or revoked.  Performed at [facility], [address] [place], [place] [address]   Culture, blood (single)     Status: None (Preliminary result)   Collection Time: 02/04/21 11:39 AM   Specimen: BLOOD  Result Value Ref Range Status    Specimen Description BLOOD BLOOD LEFT FOREARM  Final   Special Requests   Final    BOTTLES DRAWN AEROBIC ONLY Blood Culture results may not be optimal due to an inadequate volume of blood received in culture bottles   Culture   Final    NO GROWTH 4 DAYS Performed at [facility], [address] [place], [place] [address]    Report Status PENDING  Incomplete  Respiratory (~20 pathogens) panel by PCR     Status: None   Collection Time: 02/05/21 12:08 PM   Specimen: Nasopharyngeal Swab; Respiratory  Result Value Ref Range Status   Adenovirus NOT DETECTED NOT DETECTED Final   Coronavirus 229E NOT DETECTED NOT DETECTED Final    Comment: (NOTE) The Coronavirus on the Respiratory Panel, DOES NOT test for the novel  Coronavirus (2019 nCoV)    Coronavirus HKU1 NOT DETECTED NOT DETECTED Final   Coronavirus NL63 NOT DETECTED NOT DETECTED Final   Coronavirus OC43 NOT DETECTED NOT DETECTED Final   Metapneumovirus NOT DETECTED NOT DETECTED Final   Rhinovirus / Enterovirus NOT DETECTED NOT DETECTED Final   Influenza A NOT DETECTED NOT DETECTED Final   Influenza B NOT DETECTED NOT DETECTED Final   Parainfluenza Virus 1 NOT DETECTED NOT DETECTED Final   Parainfluenza Virus 2 NOT DETECTED NOT DETECTED Final   Parainfluenza Virus 3 NOT DETECTED NOT DETECTED Final  Parainfluenza Virus 4 NOT DETECTED NOT DETECTED Final   Respiratory Syncytial Virus NOT DETECTED NOT DETECTED Final   Bordetella pertussis NOT DETECTED NOT DETECTED Final   Bordetella Parapertussis NOT DETECTED NOT DETECTED Final   Chlamydophila pneumoniae NOT DETECTED NOT DETECTED Final   Mycoplasma pneumoniae NOT DETECTED NOT DETECTED Final    Comment: Performed at [facility], [address] [place], [place] [address]  Gastrointestinal Panel by PCR , Stool     Status: None   Collection Time: 02/07/21 12:11 PM   Specimen: Stool  Result Value Ref Range Status   Campylobacter species NOT DETECTED NOT DETECTED Final    Plesimonas shigelloides NOT DETECTED NOT DETECTED Final   Salmonella species NOT DETECTED NOT DETECTED Final   Yersinia enterocolitica NOT DETECTED NOT DETECTED Final   Vibrio species NOT DETECTED NOT DETECTED Final   Vibrio cholerae NOT DETECTED NOT DETECTED Final   Enteroaggregative E coli (EAEC) NOT DETECTED NOT DETECTED Final   Enteropathogenic E coli (EPEC) NOT DETECTED NOT DETECTED Final   Enterotoxigenic E coli (ETEC) NOT DETECTED NOT DETECTED Final   Shiga like toxin producing E coli (STEC) NOT DETECTED NOT DETECTED Final   Shigella/Enteroinvasive E coli (EIEC) NOT DETECTED NOT DETECTED Final   Cryptosporidium NOT DETECTED NOT DETECTED Final   Cyclospora cayetanensis NOT DETECTED NOT DETECTED Final   Entamoeba histolytica NOT DETECTED NOT DETECTED Final   Giardia lamblia NOT DETECTED NOT DETECTED Final   Adenovirus F40/41 NOT DETECTED NOT DETECTED Final   Astrovirus NOT DETECTED NOT DETECTED Final   Norovirus GI/GII NOT DETECTED NOT DETECTED Final   Rotavirus A NOT DETECTED NOT DETECTED Final   Sapovirus (I, II, IV, and V) NOT DETECTED NOT DETECTED Final    Comment: Performed at [facility], [address] [place], [place] [address]  Culture, blood (routine x 2)     Status: None (Preliminary result)   Collection Time: 02/07/21 12:30 PM   Specimen: BLOOD  Result Value Ref Range Status   Specimen Description BLOOD RIGHT ANTECUBITAL  Final   Special Requests   Final    BOTTLES DRAWN AEROBIC AND ANAEROBIC Blood Culture results may not be optimal due to an inadequate volume of blood received in culture bottles   Culture   Final    NO GROWTH 4 DAYS Performed at [facility], [address], [place], [place] [address]    Report Status PENDING  Incomplete         Radiology Studies: No results found.      Scheduled Meds:  sodium chloride   Intravenous Once   folic acid  1 mg Oral Daily   metoprolol succinate  12.5 mg Oral Daily   multivitamin  with minerals  1 tablet Oral Daily   sodium chloride flush  3 mL Intravenous Q12H   thiamine  100 mg Oral Daily   Or   thiamine  100 mg Intravenous Daily   Continuous Infusions:     LOS: 4 days     Jacquelin Hawking, MD [facility] 02/08/2021, 1:58 PM  If 7PM-7AM, please contact night-coverage [email or web address]

## 2021-02-08 NOTE — Assessment & Plan Note (Signed)
Body mass index is 15.82 kg/m. -Dietitian consult

## 2021-02-08 NOTE — Progress Notes (Signed)
Progress Note  Patient Name: Charles Schmitt. Date of Encounter: 02/08/2021  Primary Cardiologist: Yvonne Kendall, MD  Subjective   Feels well this morning.  Sitting up and eating breakfast.  Denies chest pain or dyspnea.  Declined physical therapy yesterday and I encouraged him to participate when offered as he will need his strength if he plans to go home.  Inpatient Medications    Scheduled Meds:  folic acid  1 mg Oral Daily   metoprolol succinate  12.5 mg Oral Daily   multivitamin with minerals  1 tablet Oral Daily   sodium chloride flush  3 mL Intravenous Q12H   thiamine  100 mg Oral Daily   Or   thiamine  100 mg Intravenous Daily   Continuous Infusions:  azithromycin 500 mg (02/07/21 1033)   cefTRIAXone (ROCEPHIN)  IV 2 g (02/07/21 1034)   PRN Meds: acetaminophen **OR** acetaminophen, guaiFENesin-dextromethorphan, ondansetron **OR** ondansetron (ZOFRAN) IV, sodium chloride   Vital Signs    Vitals:   02/07/21 2005 02/07/21 2326 02/08/21 0454 02/08/21 0805  BP: (!) 81/67 90/74 91/74  90/68  Pulse: (!) 109 (!) 106 (!) 108 (!) 110  Resp: 19 16 17 16   Temp: 98.3 F (36.8 C) 98.1 F (36.7 C) 98 F (36.7 C) 98.6 F (37 C)  TempSrc:      SpO2: 100% 100% 100% 97%  Weight:   59 kg   Height:        Intake/Output Summary (Last 24 hours) at 02/08/2021 0805 Last data filed at 02/08/2021 0457 Gross per 24 hour  Intake 614.28 ml  Output 450 ml  Net 164.28 ml   Filed Weights   02/06/21 0500 02/07/21 0407 02/08/21 0454  Weight: 57.9 kg 58 kg 59 kg    Physical Exam   GEN: Thin, frail, in no acute distress.  HEENT: Grossly normal.  Neck: Supple, no JVD, carotid bruits, or masses. Cardiac: RRR, tachycardic, no murmurs, rubs, or gallops. No clubbing, cyanosis, edema.  Radials 2+, DP/PT 2+ and equal bilaterally.  Respiratory:  Respirations regular and unlabored, diminished breath sounds bilaterally. GI: Soft, nontender, nondistended, BS + x 4. MS: no deformity  or atrophy. Skin: warm and dry, no rash. Neuro:  Strength and sensation are intact. Psych: AAOx3.  Normal affect.  Labs    Chemistry Recent Labs  Lab 02/04/21 0731 02/04/21 1712 02/05/21 0453 02/05/21 1651 02/06/21 0610 02/07/21 0724 02/07/21 1515 02/08/21 0657  NA 144   < > 139  --  140 139  --  135  K 4.2   < > 2.9*   < > 3.8 3.3*  --  3.9  CL 105   < > 109  --  112* 108  --  107  CO2 10*   < > 21*  --  22 24  --  24  GLUCOSE 128*   < > 213*  --  97 97  --  98  BUN 9   < > 6*  --  <5* <5*  --  <5*  CREATININE 0.94   < > 0.64  --  0.50* 0.54*  --  0.55*  CALCIUM 9.0   < > 8.1*  --  8.2* 7.9*  --  7.7*  PROT 8.5*  --  6.6  --   --   --   --   --   ALBUMIN 2.7*  --  2.3*  --   --   --   --   --   AST 54*  --  28  --   --   --   --   --   ALT 26  --  18  --   --   --   --   --   ALKPHOS 116  --  85  --   --   --   --   --   BILITOT 2.4*  --  0.9  --   --   --  0.4  --   GFRNONAA >60   < > >60  --  >60 >60  --  >60  ANIONGAP 29*   < > 9  --  6 7  --  4*   < > = values in this interval not displayed.     Hematology Recent Labs  Lab 02/06/21 0610 02/07/21 0724 02/07/21 1515 02/08/21 0657  WBC 7.1 5.2  --  5.3  RBC 2.35* 2.30*  --  2.18*  HGB 7.8* 7.4* 7.6* 7.1*  HCT 23.2* 22.2* 22.7* 21.3*  MCV 98.7 96.5  --  97.7  MCH 33.2 32.2  --  32.6  MCHC 33.6 33.3  --  33.3  RDW 15.8* 15.6*  --  15.4  PLT 217 185  --  174   HbA1c  Lab Results  Component Value Date   HGBA1C 4.8 02/05/2021    Radiology    CT ABDOMEN PELVIS W CONTRAST  Result Date: 02/04/2021 CLINICAL DATA:  Weakness, possible pneumonia EXAM: CT ABDOMEN AND PELVIS WITH CONTRAST TECHNIQUE: Multidetector CT imaging of the abdomen and pelvis was performed using the standard protocol following bolus administration of intravenous contrast. CONTRAST:  OMNIPAQUE IOHEXOL 300 MG/ML  SOLN COMPARISON:  None. FINDINGS: Lower chest: There are patchy opacities in the bilateral lung bases, right worse than left.  The imaged heart is unremarkable. Hepatobiliary: The liver is diffusely hypoattenuating consistent with marked fatty infiltration. There are no focal lesions. The gallbladder is unremarkable. There is no biliary ductal dilatation. Pancreas: Unremarkable. Spleen: Unremarkable. Adrenals/Urinary Tract: Adrenals are unremarkable. The kidneys are unremarkable, with no focal lesion, stone, hydronephrosis, or hydroureter. The bladder is unremarkable. Stomach/Bowel: The stomach is unremarkable. There is no evidence of bowel obstruction. There is no abnormal bowel wall thickening or inflammatory change. There is colonic diverticulosis without evidence of acute diverticulitis. The appendix is normal. Vascular/Lymphatic: There is scattered calcified atherosclerotic plaque throughout the nonaneurysmal abdominal aorta. The major branch vessels are patent. The main portal and splenic veins are patent. There is no abdominal or pelvic lymphadenopathy. Reproductive: Prostate is enlarged measuring up to 5.3 cm transverse. The seminal vesicles are unremarkable. Other: There is no ascites or free air. Musculoskeletal: There are subacute appearing fractures of the right sixth rib laterally and twelfth rib posteriorly. There is a 7 mm sclerotic lesion in the right iliac bone adjacent to the SI joint with a thin rim of surrounding lucency. IMPRESSION: 1. Patchy opacities in the lung bases, right more than left, may reflect infection or aspiration. 2. No acute findings in the abdomen or pelvis. 3. Subacute appearing fractures of the right sixth and twelfth ribs. 4. Severe fatty infiltration of the liver. 5. Diverticulosis without evidence of acute diverticulitis. 6. Prostatomegaly. 7. Indeterminate sclerotic lesion in the right iliac bone measuring approximately 7 mm. Correlation with any prior imaging would be helpful, if available, and correlate with PSA. In the absence of prior imaging, six-month follow-up CT could be considered to  ensure stability. 8.  Aortic Atherosclerosis (ICD10-I70.0). Electronically Signed   By: Lesia Hausen  M.D.   On: 02/04/2021 09:49   DG Chest Portable 1 View  Result Date: 02/04/2021 CLINICAL DATA:  Weakness EXAM: PORTABLE CHEST 1 VIEW COMPARISON:  None. FINDINGS: The cardiomediastinal silhouette is normal. There is no focal consolidation or pulmonary edema. There is no pleural effusion or pneumothorax. There is no acute osseous abnormality. IMPRESSION: No radiographic evidence of acute cardiopulmonary process. Electronically Signed   By: Lesia Hausen M.D.   On: 02/04/2021 08:30   Telemetry    Sinus rhythm to sinus tachycardia, 90s to 110- Personally Reviewed  Cardiac Studies   2D Echocardiogram 12.27.2022    1. Left ventricular ejection fraction, by estimation, is 30 to 35%. The  left ventricle has moderate to severely decreased function. The left  ventricle demonstrates global hypokinesis. Left ventricular diastolic  parameters are indeterminate. There is  akinesis of the left ventricular, apical segment.   2. Right ventricular systolic function is mildly reduced. The right  ventricular size is normal.   3. The mitral valve is normal in structure. No evidence of mitral valve  regurgitation.   4. The aortic valve was not well visualized. Aortic valve regurgitation  is not visualized.    Patient Profile      65 y.o. male with a history of hypertension, hyperlipidemia, tobacco abuse, and alcohol abuse, who was admitted December 27 with dyspnea and edema, normocytic anemia, and found to have LV dysfunction with an EF of 30 to 35%.  Assessment & Plan    1.  Cardiomyopathy: Patient with a history of alcoholism admitted December 27 with progressive dyspnea and edema.  Echo has shown an EF of 30 to 35% with global hypokinesis and apical akinesis.  RV function is mildly reduced.  Initially, we had considered right and left heart diagnostic catheterization today however, in the setting of  ongoing normocytic anemia, this has been deferred and we will likely pursue outpatient noninvasive ischemic evaluation.  We added low-dose ARB yesterday however, he required IV fluids due to soft blood pressures and I have discontinued this this morning.  I have transitioned him from short acting metoprolol to Toprol-XL 12.5 mg daily.  He is a poor candidate for SGLT2 inhibitor in the setting of admission with acidosis and hemoglobin A1c of 4.8.  2.  Normocytic anemia: H&H continues to drift down and is 7.1 and 21.3 this morning.  Fecal occult blood negative.  Serum iron and ferritin normal.  Folate low.  3.  Hypotension: Required fluid bolus yesterday therefore, we discontinued losartan and I switched his metoprolol to Toprol-XL, and reduce the daily dose to 12.5 mg once a day.  Follow.  4.  Sinus tachycardia: Initially called atrial fibrillation on ECG in the emergency department however, review showed sinus tachycardia with PACs and baseline artifact.  Continue beta-blocker as above.  5.  Alcohol abuse: Cessation advised.  6.  Tobacco abuse: Cessation advised.  7.  Right iliac lesion: Incidentally noted on CT of the abdomen and pelvis.  PSA was normal.  Further evaluation and follow-up per medicine.  8.  Alcoholic ketoacidosis: Lactate improved with IV fluids and was 1.9 on December 28.  Signed, Nicolasa Ducking, NP  02/08/2021, 8:05 AM    For questions or updates, please contact   Please consult [email or web address] for contact info under Cardiology/STEMI.

## 2021-02-09 LAB — TYPE AND SCREEN
ABO/RH(D): O POS
Antibody Screen: NEGATIVE
Unit division: 0

## 2021-02-09 LAB — CULTURE, BLOOD (ROUTINE X 2): Culture: NO GROWTH

## 2021-02-09 LAB — CBC
HCT: 24.5 % — ABNORMAL LOW (ref 39.0–52.0)
Hemoglobin: 8.1 g/dL — ABNORMAL LOW (ref 13.0–17.0)
MCH: 31.4 pg (ref 26.0–34.0)
MCHC: 33.1 g/dL (ref 30.0–36.0)
MCV: 95 fL (ref 80.0–100.0)
Platelets: 192 10*3/uL (ref 150–400)
RBC: 2.58 MIL/uL — ABNORMAL LOW (ref 4.22–5.81)
RDW: 19.2 % — ABNORMAL HIGH (ref 11.5–15.5)
WBC: 6.4 10*3/uL (ref 4.0–10.5)
nRBC: 0 % (ref 0.0–0.2)

## 2021-02-09 LAB — BPAM RBC
Blood Product Expiration Date: 202302012359
ISSUE DATE / TIME: 202212312137
Unit Type and Rh: 5100

## 2021-02-09 LAB — CULTURE, BLOOD (SINGLE): Culture: NO GROWTH

## 2021-02-09 LAB — HAPTOGLOBIN: Haptoglobin: 196 mg/dL (ref 32–363)

## 2021-02-09 MED ORDER — IVABRADINE HCL 5 MG PO TABS
5.0000 mg | ORAL_TABLET | Freq: Two times a day (BID) | ORAL | Status: DC
  Administered 2021-02-09 – 2021-02-10 (×3): 5 mg via ORAL
  Filled 2021-02-09 (×5): qty 1

## 2021-02-09 NOTE — Hospital Course (Signed)
Charles Schmitt is a [name] with HTN, alcohol use who presented with several weeks progressive weakness, dyspnea on exertion.  Initially found to have anemia as well as acute metabolic acidosis due to alcoholic ketoacidosis and lactic acidosis. Treated with IV fluids and improved.    Then noted to have atrial fibrillation and then noted to have reduced EF of uncertain chronicity.  Cardiology consulted.

## 2021-02-09 NOTE — Progress Notes (Signed)
°  Progress Note   Patient: Charles Schmitt. ATF:[id number] DOB: 08/28/1955 DOA: 02/04/2021     5 DOS: the patient was seen and examined on 02/09/2021      Brief hospital course: Charles Schmitt is a [name] with HTN, alcohol use who presented with several weeks progressive weakness, dyspnea on exertion.  Initially found to have anemia as well as acute metabolic acidosis due to alcoholic ketoacidosis and lactic acidosis. Treated with IV fluids and improved.    Then noted to have atrial fibrillation and then noted to have reduced EF of uncertain chronicity.  Cardiology consulted.     Assessment and Plan  Chronic systolic heart failure (HCC) Newly diagnosed. EF of 30-35% on echo.  Suspect alcoholic.  Euvolemic -Continue metoprolol and new ivabradine - Other GDMT prevented by low BP   - Plan for outpatient ishcemic work up    Anemia- (present on admission) Hgb 7.4 on admission, trended down to 7.1 and transfused last night.   No baseline. Iron stores replete, B12 replete.  No signs of GI blood loss with repeat FOBT negative.  No signs of hemolysis.    Folate deficiency noted today.  - Continue folate  Multifocal pneumonia- (present on admission) Sepsis and septic shock ruled out. Did not meet SIRS criteria on admission, and lactic acidosis likely related to liver injury, dehyrdation in setting of heavy alcohol use.    Complete 5 days azithromycin and Rocephin    Bone lesion Sclerotic lesion of right iliac bone. Associated weight loss. PSA ordered and is normal. Asymptomatic. Possible Pagets.  Essential hypertension- (present on admission) Blood pressure soft due to cardiomyopathy. -Continue low dose Metop    Underweight Body mass index is 15.82 kg/m. -Dietitian consult  Alcohol use -Continue CIWA, thiamine, MVI, folic acid           Subjective: No melena, no hematochezia.  Still feels very weak.  No dizziness or passing out.  Objective Remarkable for soft  blood pressure General appearance: Thin, chronically undernourished and chronically ill-appearing adult male.     HEENT:    Skin:  Cardiac: RRR, no murmurs, no lower extremity edema Respiratory: Crackles at bilateral bases, normal respiratory rate. Abdomen:   MSK: Reduced subcutaneous mass and fat Neuro: Awake and alert, extraocular movements intact, moves upper extremities with normal strength and coordination Psych:     Data Reviewed: Complete blood count shows hemoglobin up from last night transfusion.  Folate level is low.  Basic metabolic panel is unremarkable.  Family Communication: Primary contact listed, no answer.  Disposition: Status is: Inpatient  Remains inpatient appropriate because: Required transfusion overnight, and adjustments to cardiac medications by cardiology today.  If his hemoglobin is relatively stable tomorrow she has had no clinical bleeding, likely home tomorrow           Author: Alberteen Sam 02/09/2021 4:08 PM  For on call review [email or web address].

## 2021-02-09 NOTE — Progress Notes (Signed)
Progress Note  Patient Name: Charles Schmitt. Date of Encounter: 02/09/2021  [facility] Cardiologist: Yvonne Kendall, MD   Subjective   No acute events overnight, denies chest pain or shortness of breath.  Inpatient Medications    Scheduled Meds:  folic acid  1 mg Oral Daily   ivabradine  5 mg Oral BID WC   metoprolol succinate  12.5 mg Oral Daily   multivitamin with minerals  1 tablet Oral Daily   sodium chloride flush  3 mL Intravenous Q12H   thiamine  100 mg Oral Daily   Or   thiamine  100 mg Intravenous Daily   Continuous Infusions:  PRN Meds: acetaminophen **OR** acetaminophen, guaiFENesin-dextromethorphan, melatonin, ondansetron **OR** ondansetron (ZOFRAN) IV, sodium chloride   Vital Signs    Vitals:   02/09/21 0500 02/09/21 0532 02/09/21 0744 02/09/21 1133  BP:  95/80 96/79 100/83  Pulse:  (!) 107 (!) 102 (!) 110  Resp:  16 16 16   Temp:  98.5 F (36.9 C) 98.4 F (36.9 C) 98.8 F (37.1 C)  TempSrc:  Oral Oral Oral  SpO2:  100% 95% 99%  Weight: 64.5 kg     Height:        Intake/Output Summary (Last 24 hours) at 02/09/2021 1353 Last data filed at 02/09/2021 04/09/2021 Gross per 24 hour  Intake 375.5 ml  Output 425 ml  Net -49.5 ml   Last 3 Weights 02/09/2021 02/08/2021 02/07/2021  Weight (lbs) 142 lb 3.2 oz 130 lb 127 lb 13.9 oz  Weight (kg) 64.5 kg 58.968 kg 58 kg      Telemetry    Sinus tachycardia heart rate 108- Personally Reviewed  ECG     - Personally Reviewed  Physical Exam   GEN: Cachectic Neck: No JVD Cardiac: Tachycardic, regular Respiratory: Clear to auscultation bilaterally. GI: Soft, nontender, non-distended  MS: No edema; No deformity. Neuro:  Nonfocal  Psych: Normal affect   Labs    High Sensitivity Troponin:  No results for input(s): TROPONINIHS in the last 720 hours.   Chemistry Recent Labs  Lab 02/04/21 0731 02/04/21 0937 02/05/21 0453 02/05/21 1651 02/06/21 0610 02/07/21 0724 02/07/21 1515 02/08/21 0657  NA  144   < > 139  --  140 139  --  135  K 4.2   < > 2.9*   < > 3.8 3.3*  --  3.9  CL 105   < > 109  --  112* 108  --  107  CO2 10*   < > 21*  --  22 24  --  24  GLUCOSE 128*   < > 213*  --  97 97  --  98  BUN 9   < > 6*  --  <5* <5*  --  <5*  CREATININE 0.94   < > 0.64  --  0.50* 0.54*  --  0.55*  CALCIUM 9.0   < > 8.1*  --  8.2* 7.9*  --  7.7*  MG  --    < > 1.9  --   --   --  1.4* 1.8  PROT 8.5*  --  6.6  --   --   --   --   --   ALBUMIN 2.7*  --  2.3*  --   --   --   --   --   AST 54*  --  28  --   --   --   --   --   ALT  26  --  18  --   --   --   --   --   ALKPHOS 116  --  85  --   --   --   --   --   BILITOT 2.4*  --  0.9  --   --   --  0.4  --   GFRNONAA >60   < > >60  --  >60 >60  --  >60  ANIONGAP 29*   < > 9  --  6 7  --  4*   < > = values in this interval not displayed.    Lipids No results for input(s): CHOL, TRIG, HDL, LABVLDL, LDLCALC, CHOLHDL in the last 168 hours.  Hematology Recent Labs  Lab 02/07/21 0724 02/07/21 1515 02/08/21 0657 02/09/21 0509  WBC 5.2  --  5.3 6.4  RBC 2.30*  --  2.18* 2.58*  HGB 7.4* 7.6* 7.1* 8.1*  HCT 22.2* 22.7* 21.3* 24.5*  MCV 96.5  --  97.7 95.0  MCH 32.2  --  32.6 31.4  MCHC 33.3  --  33.3 33.1  RDW 15.6*  --  15.4 19.2*  PLT 185  --  174 192   Thyroid  Recent Labs  Lab 02/06/21 0610  TSH 2.839    BNPNo results for input(s): BNP, PROBNP in the last 168 hours.  DDimer No results for input(s): DDIMER in the last 168 hours.   Radiology    No results found.  Cardiac Studies   TTE 02/04/2021 1. Left ventricular ejection fraction, by estimation, is 30 to 35%. The  left ventricle has moderate to severely decreased function. The left  ventricle demonstrates global hypokinesis. Left ventricular diastolic  parameters are indeterminate. There is  akinesis of the left ventricular, apical segment.   2. Right ventricular systolic function is mildly reduced. The right  ventricular size is normal.   3. The mitral valve is normal  in structure. No evidence of mitral valve  regurgitation.   4. The aortic valve was not well visualized. Aortic valve regurgitation  is not visualized.   Patient Profile     66 y.o. male with history of alcohol abuse, presenting with worsening shortness of breath.  Diagnosed with pneumonia and cardiomyopathy EF 30 to 35%.  Assessment & Plan    1.  Cardiomyopathy EF 30 to 35% -Etiology possibly nonischemic due to alcohol use. -Avoiding invasive work-up due to anemia. -Low blood pressures preventing GDMT for CHF -Continue Toprol-XL 12.5 mg daily.  Patient is euvolemic. -Start ivabradine 5 mg twice daily due to tachycardia -consider right and left heart cath as outpatient if hemoglobin permits.   2.  Pneumonia, EtOH use -Antibiotics, CIWA protocol as per medicine team   No additional cardiac testing planned.  Continue medications as above.  Close follow-up as outpatient upon discharge advised Greater than 50% was spent in counseling and coordination of care with patient Total encounter time 35 minutes    Signed, Debbe Odea, MD  02/09/2021, 1:53 PM

## 2021-02-10 DIAGNOSIS — E43 Unspecified severe protein-calorie malnutrition: Secondary | ICD-10-CM | POA: Insufficient documentation

## 2021-02-10 LAB — CBC
HCT: 25.6 % — ABNORMAL LOW (ref 39.0–52.0)
Hemoglobin: 8.3 g/dL — ABNORMAL LOW (ref 13.0–17.0)
MCH: 31.2 pg (ref 26.0–34.0)
MCHC: 32.4 g/dL (ref 30.0–36.0)
MCV: 96.2 fL (ref 80.0–100.0)
Platelets: 246 10*3/uL (ref 150–400)
RBC: 2.66 MIL/uL — ABNORMAL LOW (ref 4.22–5.81)
RDW: 19.6 % — ABNORMAL HIGH (ref 11.5–15.5)
WBC: 5.8 10*3/uL (ref 4.0–10.5)
nRBC: 0 % (ref 0.0–0.2)

## 2021-02-10 MED ORDER — VITAMIN B-1 100 MG PO TABS
100.0000 mg | ORAL_TABLET | Freq: Every day | ORAL | 2 refills | Status: DC
  Filled 2021-02-10: qty 30, 30d supply, fill #0
  Filled 2021-03-27: qty 30, 30d supply, fill #1

## 2021-02-10 MED ORDER — FOLIC ACID 1 MG PO TABS: 1.0000 mg | ORAL_TABLET | Freq: Every day | ORAL | 1 refills | Status: DC

## 2021-02-10 MED ORDER — ENSURE ENLIVE PO LIQD: 237.0000 mL | Freq: Three times a day (TID) | ORAL | Status: DC

## 2021-02-10 MED ORDER — GUAIFENESIN-DM 100-10 MG/5ML PO SYRP
5.0000 mL | ORAL_SOLUTION | ORAL | 0 refills | Status: DC | PRN
  Filled 2021-02-10: qty 118, 4d supply, fill #0

## 2021-02-10 MED ORDER — ENSURE ENLIVE PO LIQD
237.0000 mL | Freq: Three times a day (TID) | ORAL | 1 refills | Status: DC
  Filled 2021-02-10: qty 21330, 30d supply, fill #0

## 2021-02-10 MED ORDER — GUAIFENESIN-DM 100-10 MG/5ML PO SYRP: 5.0000 mL | ORAL_SOLUTION | ORAL | 0 refills | Status: DC | PRN

## 2021-02-10 MED ORDER — FOLIC ACID 1 MG PO TABS
1.0000 mg | ORAL_TABLET | Freq: Every day | ORAL | 1 refills | Status: DC
  Filled 2021-02-10: qty 30, 30d supply, fill #0
  Filled 2021-03-25 – 2021-03-27 (×2): qty 30, 30d supply, fill #1

## 2021-02-10 MED ORDER — METOPROLOL SUCCINATE ER 25 MG PO TB24: 12.5000 mg | ORAL_TABLET | Freq: Every day | ORAL | 2 refills | Status: DC

## 2021-02-10 MED ORDER — THIAMINE HCL 100 MG PO TABS: 100.0000 mg | ORAL_TABLET | Freq: Every day | ORAL | 2 refills | Status: DC

## 2021-02-10 MED ORDER — IVABRADINE HCL 5 MG PO TABS
5.0000 mg | ORAL_TABLET | Freq: Two times a day (BID) | ORAL | 2 refills | Status: DC
  Filled 2021-02-10: qty 60, 30d supply, fill #0

## 2021-02-10 MED ORDER — ADULT MULTIVITAMIN W/MINERALS CH
1.0000 | ORAL_TABLET | Freq: Every day | ORAL | 2 refills | Status: DC
  Filled 2021-02-10: qty 30, 30d supply, fill #0

## 2021-02-10 MED ORDER — IVABRADINE HCL 5 MG PO TABS: 5.0000 mg | ORAL_TABLET | Freq: Two times a day (BID) | ORAL | 2 refills | Status: DC

## 2021-02-10 MED ORDER — ADULT MULTIVITAMIN W/MINERALS CH: 1.0000 | ORAL_TABLET | Freq: Every day | ORAL | 2 refills | Status: DC

## 2021-02-10 MED ORDER — METOPROLOL SUCCINATE ER 25 MG PO TB24
12.5000 mg | ORAL_TABLET | Freq: Every day | ORAL | 2 refills | Status: DC
  Filled 2021-02-10: qty 15, 30d supply, fill #0

## 2021-02-10 NOTE — Discharge Instructions (Signed)

## 2021-02-10 NOTE — Progress Notes (Signed)
Initial Nutrition Assessment  DOCUMENTATION CODES:  Severe malnutrition in context of chronic illness, Underweight  INTERVENTION:  Add Ensure Plus High Protein po TID, each supplement provides 350 kcal and 20 grams of protein.   Add Magic cup TID with meals, each supplement provides 290 kcal and 9 grams of protein.  Continue CIWA protocol.  Encourage PO and supplement intake.  NUTRITION DIAGNOSIS:  Severe Malnutrition related to chronic illness (EtOH abuse) as evidenced by severe fat depletion, severe muscle depletion.  GOAL:  Patient will meet greater than or equal to 90% of their needs  MONITOR:  PO intake, Supplement acceptance, Labs, Weight trends, Skin, I & O's  REASON FOR ASSESSMENT:  Consult Assessment of nutrition requirement/status, Diet education  ASSESSMENT:  66 yo male with a PMH of HTN and alcohol use who presented with several weeks progressive weakness and dyspnea on exertion. Initially found to have anemia as well as acute metabolic acidosis due to alcoholic ketoacidosis and lactic acidosis. Then noted to have atrial fibrillation and then noted to have reduced EF of uncertain chronicity.  Spoke to patient at bedside. Pt reports that his appetite has been very poor the past week or so. He now reports that his appetite is improved and he is excited for lunch.  Pt with limited meal documentation in Epic.  He reports that he has lost 65-70 lbs. He could not clarify how long that this was occurring for.  No recent history recorded in Epic or Care Everywhere to confirm this.  Pt open to trying Ensure while in the hospital. Recommended that he take it after he is discharged as well.  Medications: reviewed; folic acid, MVI with minerals, thiamine  Labs: reviewed; Folate 5.2 (L - repletion in progress)  NUTRITION - FOCUSED PHYSICAL EXAM: Flowsheet Row Most Recent Value  Orbital Region Severe depletion  Upper Arm Region Severe depletion  Thoracic and Lumbar  Region Severe depletion  Buccal Region Severe depletion  Temple Region Severe depletion  Clavicle Bone Region Severe depletion  Clavicle and Acromion Bone Region Severe depletion  Scapular Bone Region Severe depletion  Dorsal Hand Severe depletion  Patellar Region Severe depletion  Anterior Thigh Region Severe depletion  Posterior Calf Region Severe depletion  Edema (RD Assessment) None  Hair Reviewed  Eyes Reviewed  Mouth Reviewed  Skin Reviewed  Nails Reviewed   Diet Order:   Diet Order             Diet - low sodium heart healthy           Diet Heart Room service appropriate? Yes; Fluid consistency: Thin  Diet effective now                  EDUCATION NEEDS:  Education needs have been addressed  Skin:  Skin Assessment: Skin Integrity Issues: Skin Integrity Issues:: Stage II Stage II: Coccyx  Last BM:  02/09/21 - Type 5, brown/yellow/green, large  Height:  Ht Readings from Last 1 Encounters:  02/04/21 6\' 4"  (1.93 m)   Weight:  Wt Readings from Last 1 Encounters:  02/10/21 65.5 kg   BMI:  Body mass index is 17.58 kg/m.  Estimated Nutritional Needs:  Kcal:  2100-2300 Protein:  95-110 grams Fluid:  >2.1 L  04/10/21, RD, LDN (she/her/hers) Clinical Inpatient Dietitian RD Pager/After-Hours/Weekend Pager # in [place]

## 2021-02-10 NOTE — Progress Notes (Signed)
Patient discharged on Corlanor. No insurance at this time. Prescription sent to medication management but closed today. Called placed to [facility] office to tried to obtained samples,. But office also closed today.  24hrs medication (2 tablets) provided to patient with appropriate label for outpatient use. Address, directions and phone number for medication management provided before discharge.  [employer] paperwork also provider for patient to apply to patient assistance if needed.  Dishon Kehoe Rodriguez-Guzman PharmD, BCPS 02/10/2021 4:22 PM

## 2021-02-10 NOTE — Progress Notes (Signed)
Progress Note  Patient Name: Charles Schmitt. Date of Encounter: 02/10/2021  [facility] Cardiologist: Nelva Bush, MD   Subjective   Feels well without chest pain, shortness of breath, palpitations, or edema.  Inpatient Medications    Scheduled Meds:  folic acid  1 mg Oral Daily   ivabradine  5 mg Oral BID WC   metoprolol succinate  12.5 mg Oral Daily   multivitamin with minerals  1 tablet Oral Daily   sodium chloride flush  3 mL Intravenous Q12H   thiamine  100 mg Oral Daily   Or   thiamine  100 mg Intravenous Daily   Continuous Infusions:  PRN Meds: acetaminophen **OR** acetaminophen, guaiFENesin-dextromethorphan, melatonin, ondansetron **OR** ondansetron (ZOFRAN) IV, sodium chloride   Vital Signs    Vitals:   02/10/21 0218 02/10/21 0654 02/10/21 0656 02/10/21 0811  BP: 94/80 (!) 88/72 101/79 97/77  Pulse: 81  84 91  Resp:      Temp:  97.9 F (36.6 C)    TempSrc:  Oral    SpO2: 100%  99% 99%  Weight:      Height:        Intake/Output Summary (Last 24 hours) at 02/10/2021 0951 Last data filed at 02/10/2021 0645 Gross per 24 hour  Intake 243 ml  Output 1025 ml  Net -782 ml   Last 3 Weights 02/10/2021 02/09/2021 02/08/2021  Weight (lbs) 144 lb 6.4 oz 142 lb 3.2 oz 130 lb  Weight (kg) 65.5 kg 64.5 kg 58.968 kg      Telemetry    Predominantly sinus rhythm with PAC's and PVC's.  Episode of atrial fibrillation noted from 1857 through 2144 yesterday with ventricular rates of 90-110 bpm. - Personally Reviewed  ECG    No new tracing.  Physical Exam   GEN: Cachectic man, lying in bed. Neck: No JVD Cardiac: RRR, no murmurs, rubs, or gallops.  Respiratory: Clear to auscultation bilaterally. GI: Soft, nontender, non-distended  MS: No edema; No deformity. Neuro:  Nonfocal  Psych: Normal affect   Labs    High Sensitivity Troponin:  No results for input(s): TROPONINIHS in the last 720 hours.   Chemistry Recent Labs  Lab 02/04/21 0731 02/04/21 0937  02/05/21 0453 02/05/21 1651 02/06/21 0610 02/07/21 0724 02/07/21 1515 02/08/21 0657  NA 144   < > 139  --  140 139  --  135  K 4.2   < > 2.9*   < > 3.8 3.3*  --  3.9  CL 105   < > 109  --  112* 108  --  107  CO2 10*   < > 21*  --  22 24  --  24  GLUCOSE 128*   < > 213*  --  97 97  --  98  BUN 9   < > 6*  --  <5* <5*  --  <5*  CREATININE 0.94   < > 0.64  --  0.50* 0.54*  --  0.55*  CALCIUM 9.0   < > 8.1*  --  8.2* 7.9*  --  7.7*  MG  --    < > 1.9  --   --   --  1.4* 1.8  PROT 8.5*  --  6.6  --   --   --   --   --   ALBUMIN 2.7*  --  2.3*  --   --   --   --   --   AST 54*  --  28  --   --   --   --   --   ALT 26  --  18  --   --   --   --   --   ALKPHOS 116  --  85  --   --   --   --   --   BILITOT 2.4*  --  0.9  --   --   --  0.4  --   GFRNONAA >60   < > >60  --  >60 >60  --  >60  ANIONGAP 29*   < > 9  --  6 7  --  4*   < > = values in this interval not displayed.    Lipids No results for input(s): CHOL, TRIG, HDL, LABVLDL, LDLCALC, CHOLHDL in the last 168 hours.  Hematology Recent Labs  Lab 02/08/21 0657 02/09/21 0509 02/10/21 0536  WBC 5.3 6.4 5.8  RBC 2.18* 2.58* 2.66*  HGB 7.1* 8.1* 8.3*  HCT 21.3* 24.5* 25.6*  MCV 97.7 95.0 96.2  MCH 32.6 31.4 31.2  MCHC 33.3 33.1 32.4  RDW 15.4 19.2* 19.6*  PLT 174 192 246   Thyroid  Recent Labs  Lab 02/06/21 0610  TSH 2.839    BNPNo results for input(s): BNP, PROBNP in the last 168 hours.  DDimer No results for input(s): DDIMER in the last 168 hours.   Radiology    No results found.  Cardiac Studies   TTE (02/04/2021): 1. Left ventricular ejection fraction, by estimation, is 30 to 35%. The  left ventricle has moderate to severely decreased function. The left  ventricle demonstrates global hypokinesis. Left ventricular diastolic  parameters are indeterminate. There is  akinesis of the left ventricular, apical segment.   2. Right ventricular systolic function is mildly reduced. The right  ventricular size is  normal.   3. The mitral valve is normal in structure. No evidence of mitral valve  regurgitation.   4. The aortic valve was not well visualized. Aortic valve regurgitation  is not visualized.   Patient Profile     66 y.o. male with history of hypertension, hyperlipidemia, tobacco abuse, and alcohol abuse, admitted with alcoholic encephalopathy and incidentally found to have cardiomyopathy with LVEF 30-35% and severe anemia now status post PRBC transfusion x1.  Assessment & Plan    Cardiomyopathy: Etiology remains uncertain.  Patient appears euvolemic without heart failure symptoms.  Ischemia evaluation on hold in the setting of severe anemia requiring PRBC transfusion this admission.  Patient currently on low-dose metoprolol and ivabradine.  He did not tolerate losartan due to symptomatic hypotension.  Not a good candidate for SGLT2 inhibitor in the setting of acidosis and poor nutrition. -Continue metoprolol succinate 12.5 mg daily. -Continue ivabradine 5 mg twice daily with meals. -Consider rechallenging with ARB or aldosterone antagonist on an outpatient basis if blood pressure improves. -Defer SGLT2 inhibitor for reasons outlined above. -Plan for outpatient ischemia evaluation (catheterization versus CTA) based on hemoglobin response. -No need for standing diuretic at this time.  Paroxysmal atrial fibrillation: Incidentally noted yesterday evening without symptoms.  Patient at high risk for recurrent atrial fibrillation in the setting of his cardiomyopathy.  CHA2DS2-VASc score is at least 2 (hypertension and heart failure). -Continue low-dose metoprolol, given reasonable heart rate control while in atrial fibrillation with this and soft blood pressures. -Defer anticoagulation in the setting of severe anemia status post PRBC transfusion this admission.  Alcohol abuse: -Alcohol avoidance strongly recommended, as the patient's cardiomyopathy  could certainly be alcoholic in nature.  [facility] will sign off.   Medication Recommendations:  Continue ivabradine and metoprolol, as above. Other recommendations (labs, testing, etc):  None Follow up as an outpatient:  1-2 weeks in clinic  For questions or updates, please contact [place] Please consult [email or web address] for contact info under [facility].     Signed, Nelva Bush, MD  02/10/2021, 9:51 AM

## 2021-02-11 ENCOUNTER — Other Ambulatory Visit: Payer: Self-pay

## 2021-02-11 LAB — PATHOLOGIST SMEAR REVIEW

## 2021-02-13 ENCOUNTER — Other Ambulatory Visit: Payer: Self-pay

## 2021-02-13 ENCOUNTER — Telehealth: Payer: Self-pay | Admitting: Physician Assistant

## 2021-02-13 NOTE — Telephone Encounter (Signed)
Called and spoke with patient. Informed him that I could leave 3 weeks worth of samples at the front desk (that will last through 03/06/21) for him to pick up, and advised that if he is still in need of samples at the time of his follow up with Eula Listen on 03/03/21, we can possibly provide them if we have more in stock.  Patient was grateful for the help.

## 2021-02-13 NOTE — Telephone Encounter (Signed)
Patient states Pharmacy medication management telling him that medication Corlanor will take 4-6 weeks to receive he has been without for 2 days please assist

## 2021-02-19 ENCOUNTER — Telehealth: Payer: Self-pay | Admitting: Physician Assistant

## 2021-02-19 NOTE — Telephone Encounter (Signed)
Patient states he has a lot of sinus pressure and congestion and would like advice on what medication he can take. Please call,.

## 2021-02-19 NOTE — Telephone Encounter (Signed)
Can use short course (up to 3 days) of Afrin nasal spray to help with congestion. Saline nasal spray or antihistamines may help too and are ok to take.

## 2021-02-19 NOTE — Discharge Summary (Signed)
Physician Discharge Summary  [name] VEL:[id number] DOB: [birth year] DOA: 02/04/2021  PCP: Pcp, No  Admit date: 02/04/2021 Discharge date: 02/10/2021  Admitted From: Home.  Disposition:  HOME.   Recommendations for Outpatient Follow-up:  Follow up with PCP in 1-2 weeks Please obtain BMP/CBC in one week Please follow up WITH CARDIOLOGY AS RECOMMENDEd    Discharge Condition: stable.  CODE STATUS:full code.  Diet recommendation: Heart Healthy   Brief/Interim Summary: Charles Schmitt. is a 66 y.o. male with a history of hypertension, hyperlipidemia, and alcohol abuse. Patient presented secondary to weakness and dyspnea and was found to have evidence of alcoholic ketoacidosis and associated lactic acidosis. IV fluids initiated with improvement. While in the ED, he also had an episode of atrial fibrillation but has now converted back to sinus rhythm. Transthoracic Echocardiogram obtained this admission shows new cardiomyopathy. Cardiology consulted with plan for heart catheterization which may be postponed for outpatient workup secondary to anemia.  Discharge Diagnoses:  Active Problems:   Multifocal pneumonia   Anemia   Essential hypertension   Pressure injury of skin   Sinus tachycardia   Bone lesion   Chronic systolic heart failure (HCC)   Hypokalemia   Hyperglycemia   Alcohol use   Cardiomyopathy (HCC)   Underweight   Protein-calorie malnutrition, severe  Alcoholic ketoacidosis-resolved as of 02/05/2021, (present on admission) Patient given IV fluids with resolution.   Underweight Body mass index is 15.82 kg/m. -Dietitian consult   Alcohol use Encouraged cessation.    Multi focal pneumonia:  Completed the course of antibiotics.    Hyperglycemia Secondary to D5 IV fluids. Hemoglobin A1C of 4.8%.   Hypokalemia Normal magnesium. -Potassium supplementation as needed   Chronic systolic heart failure (HCC) Newly diagnosed. EF of 30-35% on Transthoracic  Echocardiogram from 12/27.  -Daily weights -Strict in/out -Cardiology recommendations: plan for LHC, possibly as an outpatient secondary to anemia   Bone lesion Sclerotic lesion of right iliac bone. Associated weight loss. PSA ordered and is normal. Asymptomatic. Possible Pagets.   Sinus tachycardia Concern for atrial fibrillation noted in ED. Mention of conversion to sinus rhythm after diltiazem and IV fluids. Transitioned from Diltiazem to metoprolol. Transthoracic Echocardiogram significant for severely reduced EF. Normal atria sizes. Cardiology consulted and after review, no evidence of atrial fibrillation but rather sinus tachycardia with PACs. -Telemetry -Cardiology recommendations: beta-blocker   Pressure injury of skin- (present on admission) Right coccyx.   Essential hypertension- (present on admission) -Continue metoprolol; dose reduced secondary to soft blood pressures   Anemia- (present on admission) No baseline. In setting of chronic alcohol intake. Iron panel suggests anemia of chronic disease. Normal vitamin B12 level. Unsure of etiology. Patient states he has had a colonoscopy earlier this year that was normal. No overt signs of bleeding. Normal bilirubin and LDH. Initial FOBT is negative. Hemoglobin continues to trend down -CBC daily -Check folate level -FOBT x2 more occurrences -Follow-up haptoglobin, smear review -Transfuse 1 unit of PRBC in light of newly diagnosed cardiomyopathy, possibly ischemic   Multifocal pneumonia- (present on admission) CT imaging with patchy infiltrates possibly secondary to aspiration. Empirically started on Ceftriaxone and azithromycin. Completed anti biotics.    Severe sepsis (HCC)- (present on admission) Present on admission   Nausea vomiting and diarrhea-resolved as of 02/06/2021, (present on admission) Initial concern for GI pathogen etiology. No recurrent diarrhea while inpatient.   Lactic acidosis-resolved as of 02/06/2021,  (present on admission) Appears to possibly be secondary to hypovolemia in addition to ketoacidosis. IV fluid resuscitation  given. Improved and stable.        Discharge Instructions  Discharge Instructions     Diet - low sodium heart healthy   Complete by: As directed    Discharge wound care:   Complete by: As directed    Local wound care.   Increase activity slowly   Complete by: As directed       Allergies as of 02/10/2021   No Known Allergies      Medication List     STOP taking these medications    atorvastatin 40 MG tablet Commonly known as: LIPITOR   diltiazem 180 MG 24 hr capsule Commonly known as: CARDIZEM CD   triamterene-hydrochlorothiazide 37.5-25 MG tablet Commonly known as: MAXZIDE-25       TAKE these medications    feeding supplement Liqd Take 237 mLs by mouth 3 (three) times daily between meals.   folic acid 1 MG tablet Commonly known as: FOLVITE Take 1 tablet (1 mg total) by mouth once daily.   guaiFENesin-dextromethorphan 100-10 MG/5ML syrup Commonly known as: ROBITUSSIN DM Take 5 mLs by mouth every 4 (four) hours as needed for cough.   ivabradine 5 MG Tabs tablet Commonly known as: CORLANOR Take 1 tablet (5 mg total) by mouth 2 (two) times daily with a meal.   metoprolol succinate 25 MG 24 hr tablet Commonly known as: TOPROL-XL Take 0.5 tablets (12.5 mg total) by mouth once daily.   multivitamin with minerals Tabs tablet Take 1 tablet by mouth once daily.   thiamine 100 MG tablet Commonly known as: VITAMIN B-1 Take 1 tablet (100 mg total) by mouth once daily.               Discharge Care Instructions  (From admission, onward)           Start     Ordered   02/10/21 0000  Discharge wound care:       Comments: Local wound care.   02/10/21 1414            No Known Allergies  Consultations: CARDIOLOGY.    Procedures/Studies: CT ABDOMEN PELVIS W CONTRAST  Result Date: 02/04/2021 CLINICAL DATA:   Weakness, possible pneumonia EXAM: CT ABDOMEN AND PELVIS WITH CONTRAST TECHNIQUE: Multidetector CT imaging of the abdomen and pelvis was performed using the standard protocol following bolus administration of intravenous contrast. CONTRAST:  100mL OMNIPAQUE IOHEXOL 300 MG/ML  SOLN COMPARISON:  None. FINDINGS: Lower chest: There are patchy opacities in the bilateral lung bases, right worse than left. The imaged heart is unremarkable. Hepatobiliary: The liver is diffusely hypoattenuating consistent with marked fatty infiltration. There are no focal lesions. The gallbladder is unremarkable. There is no biliary ductal dilatation. Pancreas: Unremarkable. Spleen: Unremarkable. Adrenals/Urinary Tract: Adrenals are unremarkable. The kidneys are unremarkable, with no focal lesion, stone, hydronephrosis, or hydroureter. The bladder is unremarkable. Stomach/Bowel: The stomach is unremarkable. There is no evidence of bowel obstruction. There is no abnormal bowel wall thickening or inflammatory change. There is colonic diverticulosis without evidence of acute diverticulitis. The appendix is normal. Vascular/Lymphatic: There is scattered calcified atherosclerotic plaque throughout the nonaneurysmal abdominal aorta. The major branch vessels are patent. The main portal and splenic veins are patent. There is no abdominal or pelvic lymphadenopathy. Reproductive: Prostate is enlarged measuring up to 5.3 cm transverse. The seminal vesicles are unremarkable. Other: There is no ascites or free air. Musculoskeletal: There are subacute appearing fractures of the right sixth rib laterally and twelfth rib posteriorly. There is a 7 mm  sclerotic lesion in the right iliac bone adjacent to the SI joint with a thin rim of surrounding lucency. IMPRESSION: 1. Patchy opacities in the lung bases, right more than left, may reflect infection or aspiration. 2. No acute findings in the abdomen or pelvis. 3. Subacute appearing fractures of the right sixth  and twelfth ribs. 4. Severe fatty infiltration of the liver. 5. Diverticulosis without evidence of acute diverticulitis. 6. Prostatomegaly. 7. Indeterminate sclerotic lesion in the right iliac bone measuring approximately 7 mm. Correlation with any prior imaging would be helpful, if available, and correlate with PSA. In the absence of prior imaging, six-month follow-up CT could be considered to ensure stability. 8.  Aortic Atherosclerosis (ICD10-I70.0). Electronically Signed   By: Lesia Hausen M.D.   On: 02/04/2021 09:49   DG Chest Portable 1 View  Result Date: 02/04/2021 CLINICAL DATA:  Weakness EXAM: PORTABLE CHEST 1 VIEW COMPARISON:  None. FINDINGS: The cardiomediastinal silhouette is normal. There is no focal consolidation or pulmonary edema. There is no pleural effusion or pneumothorax. There is no acute osseous abnormality. IMPRESSION: No radiographic evidence of acute cardiopulmonary process. Electronically Signed   By: Lesia Hausen M.D.   On: 02/04/2021 08:30   ECHOCARDIOGRAM COMPLETE  Result Date: 02/05/2021    ECHOCARDIOGRAM REPORT   Patient Name:   Taydon Nasworthy. Date of Exam: 02/04/2021 Medical Rec #:  [id number]          Height:       76.0 in Accession #:    [id number]         Weight:       150.0 lb Date of Birth:  [birth year]          BSA:          1.960 m Patient Age:    65 years           BP:           122/85 mmHg Patient Gender: M                  HR:           87 bpm. Exam Location:  [facility] Procedure: 2D Echo, Cardiac Doppler and Color Doppler Indications:     I48.91 Atrial Fibrillation  History:         Patient has no prior history of Echocardiogram examinations.                  Risk Factors:Hypertension and Dyslipidemia.  Sonographer:     Daphine Deutscher RDCS Referring Phys:  [id number] Alford Highland Diagnosing Phys: Debbe Odea MD IMPRESSIONS  1. Left ventricular ejection fraction, by estimation, is 30 to 35%. The left ventricle has moderate to severely decreased function.  The left ventricle demonstrates global hypokinesis. Left ventricular diastolic parameters are indeterminate. There is akinesis of the left ventricular, apical segment.  2. Right ventricular systolic function is mildly reduced. The right ventricular size is normal.  3. The mitral valve is normal in structure. No evidence of mitral valve regurgitation.  4. The aortic valve was not well visualized. Aortic valve regurgitation is not visualized. FINDINGS  Left Ventricle: Left ventricular ejection fraction, by estimation, is 30 to 35%. The left ventricle has moderate to severely decreased function. The left ventricle demonstrates global hypokinesis. The left ventricular internal cavity size was normal in size. There is no left ventricular hypertrophy. Left ventricular diastolic parameters are indeterminate. Right Ventricle: The right ventricular size is normal. No increase in right ventricular wall  thickness. Right ventricular systolic function is mildly reduced. Left Atrium: Left atrial size was normal in size. Right Atrium: Right atrial size was normal in size. Pericardium: There is no evidence of pericardial effusion. Mitral Valve: The mitral valve is normal in structure. No evidence of mitral valve regurgitation. Tricuspid Valve: The tricuspid valve is not well visualized. Tricuspid valve regurgitation is not demonstrated. Aortic Valve: The aortic valve was not well visualized. Aortic valve regurgitation is not visualized. Pulmonic Valve: The pulmonic valve was not well visualized. Pulmonic valve regurgitation is not visualized. Aorta: The aortic root is normal in size and structure. Venous: The inferior vena cava was not well visualized. IAS/Shunts: No atrial level shunt detected by color flow Doppler.  LEFT VENTRICLE PLAX 2D LVOT diam:     2.10 cm   Diastology LV SV:         32        LV e' medial:    6.31 cm/s LV SV Index:   17        LV E/e' medial:  10.1 LVOT Area:     3.46 cm  LV e' lateral:   6.20 cm/s                           LV E/e' lateral: 10.3  RIGHT VENTRICLE RV Basal diam:  3.50 cm RV S prime:     8.70 cm/s TAPSE (M-mode): 2.0 cm LEFT ATRIUM             Index        RIGHT ATRIUM           Index LA Vol (A2C):   33.9 ml 17.29 ml/m  RA Area:     10.60 cm LA Vol (A4C):   41.7 ml 21.27 ml/m  RA Volume:   20.60 ml  10.51 ml/m LA Biplane Vol: 38.7 ml 19.74 ml/m  AORTIC VALVE LVOT Vmax:   48.93 cm/s LVOT Vmean:  37.933 cm/s LVOT VTI:    0.094 m MITRAL VALVE MV Area (PHT): 8.25 cm    SHUNTS MV Decel Time: 92 msec     Systemic VTI:  0.09 m MV E velocity: 63.70 cm/s  Systemic Diam: 2.10 cm MV A velocity: 52.60 cm/s MV E/A ratio:  1.21 Debbe OdeaBrian Agbor-Etang MD Electronically signed by Debbe OdeaBrian Agbor-Etang MD Signature Date/Time: 02/05/2021/4:47:35 PM    Final       Subjective: NO NEW COMPLAINTS.   Discharge Exam: Vitals:   02/10/21 0811 02/10/21 1115  BP: 97/77 95/79  Pulse: 91 99  Resp:    Temp: 97.8 F (36.6 C) 98.6 F (37 C)  SpO2: 99% 100%   Vitals:   02/10/21 0654 02/10/21 0656 02/10/21 0811 02/10/21 1115  BP: (!) 88/72 101/79 97/77 95/79   Pulse:  84 91 99  Resp:      Temp: 97.9 F (36.6 C)  97.8 F (36.6 C) 98.6 F (37 C)  TempSrc: Oral  Oral Oral  SpO2:  99% 99% 100%  Weight:      Height:        General: Pt is alert, awake, not in acute distress Cardiovascular: RRR, S1/S2 +, no rubs, no gallops Respiratory: CTA bilaterally, no wheezing, no rhonchi Abdominal: Soft, NT, ND, bowel sounds + Extremities: no edema, no cyanosis    The results of significant diagnostics from this hospitalization (including imaging, microbiology, ancillary and laboratory) are listed below for reference.     Microbiology: No results found  for this or any previous visit (from the past 240 hour(s)).   Labs: BNP (last 3 results) No results for input(s): BNP in the last 8760 hours. Basic Metabolic Panel: No results for input(s): NA, K, CL, CO2, GLUCOSE, BUN, CREATININE, CALCIUM, MG, PHOS in the last  168 hours. Liver Function Tests: No results for input(s): AST, ALT, ALKPHOS, BILITOT, PROT, ALBUMIN in the last 168 hours. No results for input(s): LIPASE, AMYLASE in the last 168 hours. No results for input(s): AMMONIA in the last 168 hours. CBC: No results for input(s): WBC, NEUTROABS, HGB, HCT, MCV, PLT in the last 168 hours. Cardiac Enzymes: No results for input(s): CKTOTAL, CKMB, CKMBINDEX, TROPONINI in the last 168 hours. BNP: Invalid input(s): POCBNP CBG: No results for input(s): GLUCAP in the last 168 hours. D-Dimer No results for input(s): DDIMER in the last 72 hours. Hgb A1c No results for input(s): HGBA1C in the last 72 hours. Lipid Profile No results for input(s): CHOL, HDL, LDLCALC, TRIG, CHOLHDL, LDLDIRECT in the last 72 hours. Thyroid function studies No results for input(s): TSH, T4TOTAL, T3FREE, THYROIDAB in the last 72 hours.  Invalid input(s): FREET3 Anemia work up No results for input(s): VITAMINB12, FOLATE, FERRITIN, TIBC, IRON, RETICCTPCT in the last 72 hours. Urinalysis    Component Value Date/Time   COLORURINE YELLOW (A) 02/04/2021 0937   APPEARANCEUR CLEAR (A) 02/04/2021 0937   LABSPEC 1.034 (H) 02/04/2021 0937   PHURINE 6.0 02/04/2021 0937   GLUCOSEU NEGATIVE 02/04/2021 0937   HGBUR NEGATIVE 02/04/2021 0937   BILIRUBINUR NEGATIVE 02/04/2021 0937   KETONESUR 80 (A) 02/04/2021 0937   PROTEINUR NEGATIVE 02/04/2021 0937   NITRITE NEGATIVE 02/04/2021 0937   LEUKOCYTESUR NEGATIVE 02/04/2021 0937   Sepsis Labs Invalid input(s): PROCALCITONIN,  WBC,  LACTICIDVEN Microbiology No results found for this or any previous visit (from the past 240 hour(s)).   Time coordinating discharge: 33 MINUTES.   SIGNED:   Kathlen Mody, MD  [facility]

## 2021-02-19 NOTE — Telephone Encounter (Signed)
Called patient and gave him the recommendations as documented below from the pharmacist.   Patient verbalized understanding and was grateful for the call back.

## 2021-02-27 NOTE — Progress Notes (Signed)
Cardiology Office Note    Date:  03/03/2021   ID:  Charles Schmitt., DOB Jun 14, 1955, MRN [id number]  PCP:  Pcp, No  Cardiologist:  Nelva Bush, MD  Electrophysiologist:  None   Chief Complaint: Hospital follow up  History of Present Illness:   Charles Schmitt. is a 66 y.o. male with history of newly diagnosed HFrEF, PAF, HTN, HLD, anemia, tobacco use, and alcohol use who presents for hospital follow-up as outlined below.  Prior to his recent admission in 01/2021 he did not have any previously known cardiac history.  He was admitted to the hospital on 12/27 through 1/2 with severe sepsis secondary to multifocal pneumonia complicated by transfusion dependent anemia and newly diagnosed HFrEF.  Chest x-ray showed no acute cardiopulmonary process.  CT abdomen pelvis showed patchy opacities in the lung bases with the right being greater than the left possibly reflecting infection or aspiration.  No acute findings were noted in the abdomen/pelvis.  Incidentally, there was a subacute appearing fracture in the right sixth and 12th ribs, severe fatty infiltration of the liver, diverticulosis without evidence of acute diverticulitis, prostamegaly, and an indeterminate sclerotic lesion in the right iliac bone.  Echo showed an EF of 30 to 35%, global hypokinesis, indeterminate LV diastolic function parameters, akinesis of the left ventricular apical segment, mildly reduced RV systolic function with normal ventricular cavity size, and no significant valvular abnormality.  Initially, plans were to move forward with diagnostic R/LHC.  However, given severe anemia this was deferred until his anemia was further evaluated.  He received 1 unit PRBC during admission.  Etiology of his anemia remained uncertain.  He was incidentally noted to have a brief episode of A. fib on the day prior to discharge.  Anticoagulation was deferred in the setting of severe transfusion dependent anemia.  He comes in doing well  from a cardiac perspective.  He denies any chest pain, dyspnea, palpitations, dizziness, presyncope, or syncope.  He does note some mild bilateral pedal edema, otherwise is without lower extremity swelling.  He has a longstanding 3-4 pillow orthopnea, which he reports has been his baseline since childhood.  He does try and watch his salt intake.  He is drinking greater than 2 L of liquids per day.  He reports adherence to all medications.  He has abstained from alcohol use.  He has smoked 1 cigar since his hospital discharge.  He denies any falls, hematochezia, or melena.  He does continue to note chronic nasal congestion and sinus pressure.   Labs independently reviewed: 02/2021 - HGB 8.3, PLT 246 01/2021 - magnesium 1.8, potassium 3.9, BUN <5, SCr 0.55, TSH normal, A1c 4.8, albumin 2.3, AST/ALT normal 12/2017 - TC 174, TG 77, HDL 79, LDL 79  Past Medical History:  Diagnosis Date   Alcohol abuse    a. 1/5 of vodka daily.   Cardiomyopathy ([place])    a. 01/2021 Echo: EF 30-35%, glob HK, apical AK. Mildly reduced RV fxn.   Hyperlipidemia    Hypertension    Tobacco abuse     Past Surgical History:  Procedure Laterality Date   NO PAST SURGERIES      Current Medications: Current Meds  Medication Sig   feeding supplement (ENSURE ENLIVE / ENSURE PLUS) LIQD Take 237 mLs by mouth 3 (three) times daily between meals.   folic acid (FOLVITE) 1 MG tablet Take 1 tablet (1 mg total) by mouth once daily.   guaiFENesin-dextromethorphan (ROBITUSSIN DM) 100-10 MG/5ML syrup Take 5 mLs  by mouth every 4 (four) hours as needed for cough.   ivabradine (CORLANOR) 5 MG TABS tablet Take 1 tablet (5 mg total) by mouth 2 (two) times daily with a meal.   metoprolol succinate (TOPROL-XL) 25 MG 24 hr tablet Take 0.5 tablets (12.5 mg total) by mouth once daily.   metoprolol tartrate (LOPRESSOR) 100 MG tablet Take 1 tablet (100 mg total) by mouth once for 1 dose. (Take 2 hours prior to your test).   Multiple Vitamin  (MULTIVITAMIN WITH MINERALS) TABS tablet Take 1 tablet by mouth once daily.   thiamine (VITAMIN B-1) 100 MG tablet Take 1 tablet (100 mg total) by mouth once daily.    Allergies:   Patient has no known allergies.   Social History   Socioeconomic History   Marital status: Single    Spouse name: Not on file   Number of children: Not on file   Years of education: Not on file   Highest education level: Not on file  Occupational History   Not on file  Tobacco Use   Smoking status: Some Days    Packs/day: 0.25    Years: 40.00    Pack years: 10.00    Types: Cigarettes   Smokeless tobacco: Never  Vaping Use   Vaping Use: Never used  Substance and Sexual Activity   Alcohol use: Yes    Comment: 1/5 vodka per day   Drug use: Not Currently   Sexual activity: Not on file  Other Topics Concern   Not on file  Social History Narrative   Lives locally with his wife.  Generally active without symptoms or limitations.  Drinks 1/5 of vodka daily.   Social Determinants of Health   Financial Resource Strain: Not on file  Food Insecurity: Not on file  Transportation Needs: Not on file  Physical Activity: Not on file  Stress: Not on file  Social Connections: Not on file     Family History:  The patient's family history includes Cancer in his father; Diabetes in his father.  ROS:   Review of Systems  Constitutional:  Positive for malaise/fatigue. Negative for chills, diaphoresis, fever and weight loss.  HENT:  Positive for congestion and sinus pain.   Eyes:  Negative for discharge and redness.  Respiratory:  Negative for cough, sputum production, shortness of breath and wheezing.   Cardiovascular:  Positive for leg swelling. Negative for chest pain, palpitations, orthopnea, claudication and PND.       Bilateral pedal/ankle edema  Gastrointestinal:  Negative for abdominal pain, blood in stool, heartburn, melena, nausea and vomiting.  Musculoskeletal:  Negative for falls and myalgias.   Skin:  Negative for rash.  Neurological:  Negative for dizziness, tingling, tremors, sensory change, speech change, focal weakness, loss of consciousness and weakness.  Endo/Heme/Allergies:  Does not bruise/bleed easily.  Psychiatric/Behavioral:  Negative for substance abuse. The patient is not nervous/anxious.     EKGs/Labs/Other Studies Reviewed:    Studies reviewed were summarized above. The additional studies were reviewed today:  2D echo 02/04/2021:  1. Left ventricular ejection fraction, by estimation, is 30 to 35%. The  left ventricle has moderate to severely decreased function. The left  ventricle demonstrates global hypokinesis. Left ventricular diastolic  parameters are indeterminate. There is  akinesis of the left ventricular, apical segment.   2. Right ventricular systolic function is mildly reduced. The right  ventricular size is normal.   3. The mitral valve is normal in structure. No evidence of mitral valve  regurgitation.   4. The aortic valve was not well visualized. Aortic valve regurgitation  is not visualized.   EKG:  EKG is ordered today.  The EKG ordered today demonstrates NSR with PACs, 80 bpm, incomplete RBBB, anterolateral T wave inversion consistent with prior tracing  Recent Labs: 02/05/2021: ALT 18 02/06/2021: TSH 2.839 02/08/2021: BUN <5; Creatinine, Ser 0.55; Magnesium 1.8; Potassium 3.9; Sodium 135 02/10/2021: Hemoglobin 8.3; Platelets 246  Recent Lipid Panel No results found for: CHOL, TRIG, HDL, CHOLHDL, VLDL, LDLCALC, LDLDIRECT  PHYSICAL EXAM:    VS:  BP 126/86 (BP Location: Left Arm, Patient Position: Sitting, Cuff Size: Normal)    Pulse 80    Ht 6\' 4"  (1.93 m)    Wt 141 lb (64 kg)    SpO2 97%    BMI 17.16 kg/m   BMI: Body mass index is 17.16 kg/m.  Physical Exam Vitals reviewed.  Constitutional:      Appearance: He is well-developed.  HENT:     Head: Normocephalic and atraumatic.  Eyes:     General:        Right eye: No discharge.         Left eye: No discharge.  Neck:     Vascular: No JVD.  Cardiovascular:     Rate and Rhythm: Normal rate and regular rhythm.     Pulses:          Posterior tibial pulses are 2+ on the right side and 2+ on the left side.     Heart sounds: Normal heart sounds, S1 normal and S2 normal. Heart sounds not distant. No midsystolic click and no opening snap. No murmur heard.   No friction rub.  Pulmonary:     Effort: Pulmonary effort is normal. No respiratory distress.     Breath sounds: Normal breath sounds. No decreased breath sounds, wheezing or rales.  Chest:     Chest wall: No tenderness.  Abdominal:     General: There is no distension.     Palpations: Abdomen is soft.     Tenderness: There is no abdominal tenderness.  Musculoskeletal:     Cervical back: Normal range of motion.     Right lower leg: Edema present.     Left lower leg: Edema present.     Comments: Trivial bilateral ankle edema  Skin:    General: Skin is warm and dry.     Nails: There is no clubbing.  Neurological:     Mental Status: He is alert and oriented to person, place, and time.  Psychiatric:        Speech: Speech normal.        Behavior: Behavior normal.        Thought Content: Thought content normal.        Judgment: Judgment normal.    Wt Readings from Last 3 Encounters:  03/03/21 141 lb (64 kg)  02/10/21 144 lb 6.4 oz (65.5 kg)     ASSESSMENT & PLAN:   HFrEF: He appears euvolemic and well compensated.  Etiology of his cardiomyopathy remains uncertain.  Schedule coronary CTA to evaluate for ischemic etiology.  Cannot exclude alcohol induced cardiomyopathy.  Continue ivabradine and Toprol-XL.  Check CBC and CMP.  If renal function/potassium allow, look to add spironolactone over the phone on 03/04/2021 with a follow-up BMP 1 week after initiating medication.  Defer addition of SGLT2 inhibitor given history of poor nutrition.  Following ischemic evaluation and optimization of maximally tolerated GDMT we  will look to  pursue a limited echo.  If his cardiomyopathy persists, with an EF less than 35% at that time, and he continues to abstain from alcohol, we would look to refer him to EP for consideration of ICD for primary prevention.  CHF education.  PAF: Maintaining sinus rhythm.  Not currently on [facility] given brief episode and in the context of transfusion dependent anemia.  Should he have recurrence of atrial arrhythmia this will need to be revisited in the future.  Continue Toprol-XL.  CHA2DS2-VASc at least 2 (CHF, HTN).  Abnormal CT abdomen/pelvis: He has been notified of results.  He has been given the number to [facility] for follow-up.  HTN: Blood pressure is well controlled at today's visit.  Continue medical therapy as outlined above.  HLD: LDL 79 in 2019.  Further risk stratification to be obtained through coronary CTA.  Anemia: Of uncertain etiology.  Check CBC.  He has been given the number to [facility] to establish with a PCP for ongoing management.    Disposition: F/u with Dr. Saunders Revel or an APP in 1 month.   Medication Adjustments/Labs and Tests Ordered: Current medicines are reviewed at length with the patient today.  Concerns regarding medicines are outlined above. Medication changes, Labs and Tests ordered today are summarized above and listed in the Patient Instructions accessible in Encounters.   Signed, Christell Faith, PA-C 03/03/2021 12:49 PM     [address] [place], [place] [address] [phone]

## 2021-03-03 ENCOUNTER — Other Ambulatory Visit: Payer: Self-pay

## 2021-03-03 ENCOUNTER — Ambulatory Visit (INDEPENDENT_AMBULATORY_CARE_PROVIDER_SITE_OTHER): Payer: Self-pay | Admitting: Physician Assistant

## 2021-03-03 ENCOUNTER — Encounter: Payer: Self-pay | Admitting: Physician Assistant

## 2021-03-03 VITALS — BP 126/86 | HR 80 | Ht 76.0 in | Wt 141.0 lb

## 2021-03-03 DIAGNOSIS — I5020 Unspecified systolic (congestive) heart failure: Secondary | ICD-10-CM

## 2021-03-03 DIAGNOSIS — I10 Essential (primary) hypertension: Secondary | ICD-10-CM

## 2021-03-03 DIAGNOSIS — R072 Precordial pain: Secondary | ICD-10-CM

## 2021-03-03 DIAGNOSIS — D649 Anemia, unspecified: Secondary | ICD-10-CM

## 2021-03-03 DIAGNOSIS — R935 Abnormal findings on diagnostic imaging of other abdominal regions, including retroperitoneum: Secondary | ICD-10-CM

## 2021-03-03 DIAGNOSIS — E7849 Other hyperlipidemia: Secondary | ICD-10-CM

## 2021-03-03 DIAGNOSIS — I480 Paroxysmal atrial fibrillation: Secondary | ICD-10-CM

## 2021-03-03 MED ORDER — METOPROLOL TARTRATE 100 MG PO TABS
100.0000 mg | ORAL_TABLET | Freq: Once | ORAL | 0 refills | Status: DC
  Filled 2021-03-03: qty 1, 1d supply, fill #0

## 2021-03-03 NOTE — Patient Instructions (Addendum)
Medication Instructions:  No changes at this time.  *If you need a refill on your cardiac medications before your next appointment, please call your pharmacy*   Lab Work: CBC, CMET today  If you have labs (blood work) drawn today and your tests are completely normal, you will receive your results only by: MyChart Message (if you have MyChart) OR A paper copy in the mail If you have any lab test that is abnormal or we need to change your treatment, we will call you to review the results.   Testing/Procedures:   Your cardiac CT will be scheduled at the below location:    [facility] [address] [place], [place] [address] [phone]  Scheduled 03/13/2021 at 3:30 pm  Please arrive 15 mins early (3:15 pm) for check-in and test prep.  Please follow these instructions carefully (unless otherwise directed):  Hold all erectile dysfunction medications at least 3 days (72 hrs) prior to test.  On the Night Before the Test: Be sure to Drink plenty of water. Do not consume any caffeinated/decaffeinated beverages or chocolate 12 hours prior to your test. Do not take any antihistamines 12 hours prior to your test.   On the Day of the Test: Drink plenty of water until 1 hour prior to the test. Do not eat any food 4 hours prior to the test. You may take your regular medications prior to the test.  Take the one tablet of metoprolol tartrate (Lopressor) 100 mg two hours prior to test.        After the Test: Drink plenty of water. After receiving IV contrast, you may experience a mild flushed feeling. This is normal. On occasion, you may experience a mild rash up to 24 hours after the test. This is not dangerous. If this occurs, you can take Benadryl 25 mg and increase your fluid intake. If you experience trouble breathing, this can be serious. If it is severe call 911 IMMEDIATELY. If it is mild, please call our office. If you take any  of these medications: Glipizide/Metformin, Avandament, Glucavance, please do not take 48 hours after completing test unless otherwise instructed.   For scheduling needs, including cancellations and rescheduling, please call [place], [phone].    Follow-Up: At [facility], you and your health needs are our priority.  As part of our continuing mission to provide you with exceptional heart care, we have created designated Provider Care Teams.  These Care Teams include your primary Cardiologist (physician) and Advanced Practice Providers (APPs -  Physician Assistants and Nurse Practitioners) who all work together to provide you with the care you need, when you need it.  We recommend signing up for the patient portal called "MyChart".  Sign up information is provided on this After Visit Summary.  MyChart is used to connect with patients for Virtual Visits (Telemedicine).  Patients are able to view lab/test results, encounter notes, upcoming appointments, etc.  Non-urgent messages can be sent to your provider as well.   To learn more about what you can do with MyChart, go to [email or web address].    Your next appointment:   1 month(s)  The format for your next appointment:   In Person  Provider:   Yvonne Kendall, MD or Eula Listen, PA-C    Please call [phone]

## 2021-03-04 LAB — COMPREHENSIVE METABOLIC PANEL
ALT: 11 IU/L (ref 0–44)
AST: 12 IU/L (ref 0–40)
Albumin/Globulin Ratio: 0.9 — ABNORMAL LOW (ref 1.2–2.2)
Albumin: 3 g/dL — ABNORMAL LOW (ref 3.8–4.8)
Alkaline Phosphatase: 163 IU/L — ABNORMAL HIGH (ref 44–121)
BUN/Creatinine Ratio: 5 — ABNORMAL LOW (ref 10–24)
BUN: 4 mg/dL — ABNORMAL LOW (ref 8–27)
Bilirubin Total: 0.3 mg/dL (ref 0.0–1.2)
CO2: 26 mmol/L (ref 20–29)
Calcium: 8.5 mg/dL — ABNORMAL LOW (ref 8.6–10.2)
Chloride: 107 mmol/L — ABNORMAL HIGH (ref 96–106)
Creatinine, Ser: 0.83 mg/dL (ref 0.76–1.27)
Globulin, Total: 3.3 g/dL (ref 1.5–4.5)
Glucose: 95 mg/dL (ref 70–99)
Potassium: 3.8 mmol/L (ref 3.5–5.2)
Sodium: 145 mmol/L — ABNORMAL HIGH (ref 134–144)
Total Protein: 6.3 g/dL (ref 6.0–8.5)
eGFR: 98 mL/min/{1.73_m2} (ref >59)

## 2021-03-04 LAB — CBC
Hematocrit: 31.7 % — ABNORMAL LOW (ref 37.5–51.0)
Hemoglobin: 10.5 g/dL — ABNORMAL LOW (ref 13.0–17.7)
MCH: 32.6 pg (ref 26.6–33.0)
MCHC: 33.1 g/dL (ref 31.5–35.7)
MCV: 98 fL — ABNORMAL HIGH (ref 79–97)
Platelets: 402 10*3/uL (ref 150–450)
RBC: 3.22 x10E6/uL — ABNORMAL LOW (ref 4.14–5.80)
RDW: 16.3 % — ABNORMAL HIGH (ref 11.6–15.4)
WBC: 6.1 10*3/uL (ref 3.4–10.8)

## 2021-03-05 ENCOUNTER — Telehealth: Payer: Self-pay | Admitting: *Deleted

## 2021-03-05 ENCOUNTER — Other Ambulatory Visit: Payer: Self-pay

## 2021-03-05 DIAGNOSIS — I5020 Unspecified systolic (congestive) heart failure: Secondary | ICD-10-CM

## 2021-03-05 DIAGNOSIS — I480 Paroxysmal atrial fibrillation: Secondary | ICD-10-CM

## 2021-03-05 DIAGNOSIS — D649 Anemia, unspecified: Secondary | ICD-10-CM

## 2021-03-05 MED ORDER — SPIRONOLACTONE 25 MG PO TABS
12.5000 mg | ORAL_TABLET | Freq: Every day | ORAL | 3 refills | Status: DC
  Filled 2021-03-05: qty 15, 30d supply, fill #0
  Filled 2021-03-27: qty 15, 30d supply, fill #1

## 2021-03-05 NOTE — Telephone Encounter (Signed)
Reviewed results and recommendations with patient. He verbalized understanding and lab appointment scheduled. He states that he is running out of the ivabradine and medication management has not received their supply yet. Advised that if he is not able to pick up tomorrow then to come by our office for samples. He verbalized understanding with no further questions at this time. Will update triage nurse for tomorrow in case he should come in for those.

## 2021-03-05 NOTE — Telephone Encounter (Signed)
-----   Message from Sondra Barges, PA-C sent at 03/05/2021  7:33 AM EST ----- Please inform patient labs show a mildly low hemoglobin though this is improved when compared to his readings in December.  Kidney function is normal.  1 nonspecific liver function is mildly elevated.  Recommendations: -Established with PCP for ongoing management of anemia and abnormal liver function -Start spironolactone 12.5 mg daily -BMET 1 week after initiating spironolactone -Follow-up as previously scheduled

## 2021-03-06 ENCOUNTER — Other Ambulatory Visit: Payer: Self-pay

## 2021-03-06 NOTE — Telephone Encounter (Signed)
Medication management has not received their supply of Corlanor. Patient also has not picked up spironolactone yet as well and inquired about the labs. Reviewed that I would place some samples up front and we rescheduled his labs. He verbalized understanding of the plan with no further questions at this time.   Medication Samples have been provided to the patient.  Drug name: Ivabradine (Corlanor)         Strength: 5 mg         Qty: 2 bottles   LOT: [id number]   Exp.Date: 08/26

## 2021-03-06 NOTE — Telephone Encounter (Signed)
He should continue current dose Toprol-XL while we await to see if he is approved for Corlanor.

## 2021-03-06 NOTE — Telephone Encounter (Signed)
Called over to Medication Management to check on when they would have medication in. Patient has not completed his intake application and the process will be 6-8 weeks. Note placed on samples that I need to speak with patient when he comes to pick them up. Will also notify provider that he may not be able to afford medication to see if there is another option for him.

## 2021-03-06 NOTE — Telephone Encounter (Signed)
Patient states that medication management will not have medication at this time Patient requesting samples

## 2021-03-07 ENCOUNTER — Other Ambulatory Visit: Payer: Self-pay

## 2021-03-10 ENCOUNTER — Other Ambulatory Visit: Payer: Self-pay

## 2021-03-11 ENCOUNTER — Other Ambulatory Visit: Payer: Self-pay

## 2021-03-11 MED ORDER — METOPROLOL SUCCINATE ER 25 MG PO TB24
12.5000 mg | ORAL_TABLET | Freq: Every day | ORAL | 3 refills | Status: DC
  Filled 2021-03-11 – 2021-03-25 (×2): qty 45, 90d supply, fill #0
  Filled 2021-03-27: qty 26, 52d supply, fill #0

## 2021-03-11 NOTE — Telephone Encounter (Signed)
Spoke with patient and reviewed samples are still up front waiting on him. He verbalized understanding and reports he will be by later today. Discussed that medication management needs his documentation in order to assist him. He states he is going by there today as well. Advised that if he should not qualify or if unable to get assistance he would remain on the Toprol XL. He verbalized understanding of our conversation, necessary documents needed, and he had no further questions at this time.

## 2021-03-11 NOTE — Addendum Note (Signed)
Addended by: Bryna Colander on: 03/11/2021 01:51 PM   Modules accepted: Orders

## 2021-03-12 ENCOUNTER — Telehealth (HOSPITAL_COMMUNITY): Payer: Self-pay | Admitting: *Deleted

## 2021-03-12 NOTE — Telephone Encounter (Signed)
Attempted to call patient regarding upcoming cardiac CT appointment. °Left message on voicemail with name and callback number ° °Pamula Luther RN Navigator Cardiac Imaging °[facility] °[phone] Office °[phone] Cell ° °

## 2021-03-13 ENCOUNTER — Ambulatory Visit: Admission: RE | Admit: 2021-03-13 | Payer: Self-pay | Source: Ambulatory Visit

## 2021-03-14 ENCOUNTER — Other Ambulatory Visit: Payer: Self-pay

## 2021-03-18 ENCOUNTER — Other Ambulatory Visit: Payer: Self-pay

## 2021-03-20 ENCOUNTER — Other Ambulatory Visit: Payer: Self-pay

## 2021-03-25 ENCOUNTER — Telehealth (HOSPITAL_COMMUNITY): Payer: Self-pay | Admitting: *Deleted

## 2021-03-25 ENCOUNTER — Other Ambulatory Visit: Payer: Self-pay

## 2021-03-25 NOTE — Telephone Encounter (Signed)
Attempted to call patient regarding upcoming cardiac CT appointment. °Left message on voicemail with name and callback number ° °Adaia Matthies RN Navigator Cardiac Imaging °[facility] °[phone] Office °[phone] Cell ° °

## 2021-03-26 ENCOUNTER — Other Ambulatory Visit (HOSPITAL_COMMUNITY): Payer: Self-pay | Admitting: *Deleted

## 2021-03-26 ENCOUNTER — Telehealth (HOSPITAL_COMMUNITY): Payer: Self-pay | Admitting: *Deleted

## 2021-03-26 MED ORDER — IVABRADINE HCL 5 MG PO TABS: ORAL_TABLET | ORAL | 0 refills | Status: DC

## 2021-03-26 NOTE — Telephone Encounter (Signed)
Reaching out to patient to offer assistance regarding upcoming cardiac imaging study; pt verbalizes understanding of appt date/time, parking situation and where to check in, pre-test NPO status and medications ordered, and verified current allergies; name and call back number provided for further questions should they arise  Larey Brick RN Navigator Cardiac Imaging [facility] [phone] office [phone] cell  Patient to take 10mg  ivabradine and 100mg  metoprolol tartrate two hours prior to his cardiac CT scan.

## 2021-03-27 ENCOUNTER — Other Ambulatory Visit: Payer: Self-pay | Admitting: Physician Assistant

## 2021-03-27 ENCOUNTER — Other Ambulatory Visit: Payer: Self-pay

## 2021-03-27 ENCOUNTER — Other Ambulatory Visit: Payer: Self-pay | Admitting: *Deleted

## 2021-03-27 ENCOUNTER — Ambulatory Visit: Admission: RE | Admit: 2021-03-27 | Payer: Self-pay | Source: Ambulatory Visit

## 2021-03-27 MED ORDER — METOPROLOL TARTRATE 100 MG PO TABS: 100.0000 mg | ORAL_TABLET | Freq: Once | ORAL | 0 refills | Status: DC

## 2021-03-27 NOTE — Telephone Encounter (Signed)
I spoke with pt to verify if pt needed this medication filled. Pt mentioned that Pharmacy said that they weren't able to provide medication due to availability/distributor. Pt also mentioned that he didn't show up for his CT today and wanted to reschedule due to Transportation issues and palpitations. Please contact pt and advise.

## 2021-03-28 ENCOUNTER — Ambulatory Visit: Payer: Self-pay

## 2021-03-28 ENCOUNTER — Other Ambulatory Visit: Payer: Self-pay

## 2021-03-31 ENCOUNTER — Telehealth: Payer: Self-pay | Admitting: Pharmacy Technician

## 2021-03-31 ENCOUNTER — Ambulatory Visit: Payer: Self-pay | Admitting: Pharmacy Technician

## 2021-03-31 NOTE — Telephone Encounter (Signed)
Patient called 03/28/21 inquiring about refills.  Told patient that he needed to complete eligibility in order to receive additional medication beyond initial 30-day fill.  Patient stated that he would come in with girlfriend to complete paperwork and sign letter of support.  Patient did not show.  Patient called on 03/31/21 inquiring about refills.  Once again explained that eligibility needed to be completed to receive additional medication past the first 30-day fill.  Patient stated that he had just waken-up and needed to take a shower.  Wanted to know if he could come in at 11:00a.m.  Told patient that I already had a patient at that time.  Suggested afternoon.  Patient stated that afternoon would not work because it would interfere with his girlfriend's soap operas.  Patient stated that he would come to [facility] at 10:00a.m.  Patient was a no-show for the appointment.  [facility]

## 2021-03-31 NOTE — Progress Notes (Signed)
Patient no show.  Charles Schmitt Care Manager [facility]

## 2021-04-07 ENCOUNTER — Other Ambulatory Visit: Payer: Self-pay

## 2021-04-07 ENCOUNTER — Ambulatory Visit: Payer: Self-pay | Admitting: Pharmacy Technician

## 2021-04-07 DIAGNOSIS — Z79899 Other long term (current) drug therapy: Secondary | ICD-10-CM

## 2021-04-07 NOTE — Progress Notes (Signed)
Completed [facility] application and contract.  Patient agreed to all terms of the [facility] contract.    Patient approved to receive medication assistance at [facility] until time for re-certification in 7846, and as long as eligibility criteria continues to be met.    Provided patient with Civil engineer, contracting based on his particular needs.    [facility]

## 2021-04-11 ENCOUNTER — Ambulatory Visit: Payer: Self-pay | Admitting: Internal Medicine

## 2021-04-11 NOTE — Progress Notes (Deleted)
? ?  Follow-up Outpatient Visit ?Date: 04/11/2021 ? ?Primary Care Provider: ?Pcp, No ?No address on file ? ?Chief Complaint: *** ? ?HPI:  Charles Schmitt is a 66 y.o. male with history of HFrEF, paroxysmal atrial fibrillation, hypertension, hyperlipidemia, anemia, tobacco use, and alcohol use, who presents for follow-up of HFrEF.  He was hospitalized at [facility] in late 01/2021 with progressive shortness of breath.  He was found to have cardiomyopathy with LVEF 30-35% as well as mild RV dysfunction.  We initially plan to perform right and left heart catheterization during his hospitalization, though this was deferred in the setting of severe anemia of uncertain etiology.  He was noted to have a brief episode of atrial fibrillation on the day prior to discharge; anticoagulation was deferred in the setting of his anemia requiring PRBC transfusion during the hospitalization.  He was seen in follow-up on 03/03/2021 by Eula Listen, PA, at which time he denied chest pain, shortness of breath, palpitations.  He noted some mild pedal edema and chronic 3-4 pillow orthopnea.  He was referred for coronary CTA to assess for ischemic substrate but failed to show on 2 separate occasions for the scan. ? ?-------------------------------------------------------------------------------------------------- ? ?Past Medical History:  ?Diagnosis Date  ? Alcohol abuse   ? a. 1/5 of vodka daily.  ? Cardiomyopathy (HCC)   ? a. 01/2021 Echo: EF 30-35%, glob HK, apical AK. Mildly reduced RV fxn.  ? Hyperlipidemia   ? Hypertension   ? Tobacco abuse   ? ?Past Surgical History:  ?Procedure Laterality Date  ? NO PAST SURGERIES    ? ? ?No outpatient medications have been marked as taking for the 04/11/21 encounter (Appointment) with Joash Tony, Cristal Deer, MD.  ? ? ?Allergies: Patient has no known allergies. ? ?Social History  ? ?Tobacco Use  ? Smoking status: Some Days  ?  Packs/day: 0.25  ?  Years: 40.00  ?  Pack years: 10.00  ?  Types: Cigarettes  ? Smokeless  tobacco: Never  ?Vaping Use  ? Vaping Use: Never used  ?Substance Use Topics  ? Alcohol use: Yes  ?  Comment: 1/5 vodka per day  ? Drug use: Not Currently  ? ? ?Family History  ?Problem Relation Age of Onset  ? Cancer Father   ? Diabetes Father   ? ? ?Review of Systems: ?A 12-system review of systems was performed and was negative except as noted in the HPI. ? ?-------------------------------------------------------------------------------------------------- ? ?Physical Exam: ?There were no vitals taken for this visit. ? ?General:  NAD. ?Neck: No JVD or HJR. ?Lungs: Clear to auscultation bilaterally without wheezes or crackles. ?Heart: Regular rate and rhythm without murmurs, rubs, or gallops. ?Abdomen: Soft, nontender, nondistended. ?Extremities: No lower extremity edema. ? ?EKG:  *** ? ?Lab Results  ?Component Value Date  ? WBC 6.1 03/03/2021  ? HGB 10.5 (L) 03/03/2021  ? HCT 31.7 (L) 03/03/2021  ? MCV 98 (H) 03/03/2021  ? PLT 402 03/03/2021  ? ? ?Lab Results  ?Component Value Date  ? NA 145 (H) 03/03/2021  ? K 3.8 03/03/2021  ? CL 107 (H) 03/03/2021  ? CO2 26 03/03/2021  ? BUN 4 (L) 03/03/2021  ? CREATININE 0.83 03/03/2021  ? GLUCOSE 95 03/03/2021  ? ALT 11 03/03/2021  ? ? ?No results found for: CHOL, HDL, LDLCALC, LDLDIRECT, TRIG, CHOLHDL ? ?-------------------------------------------------------------------------------------------------- ? ?ASSESSMENT AND PLAN: ?*** ? ?Charles Kendall, MD ?04/11/2021 ?7:16 AM ? ?

## 2021-04-14 ENCOUNTER — Encounter: Payer: Self-pay | Admitting: Internal Medicine

## 2021-04-15 ENCOUNTER — Other Ambulatory Visit: Payer: Self-pay

## 2021-04-16 ENCOUNTER — Telehealth (HOSPITAL_COMMUNITY): Payer: Self-pay | Admitting: *Deleted

## 2021-04-16 ENCOUNTER — Telehealth: Payer: Self-pay | Admitting: Cardiology

## 2021-04-16 NOTE — Telephone Encounter (Signed)
Reaching out to patient to offer assistance regarding upcoming cardiac imaging study; pt verbalizes understanding of appt date/time, parking situation and where to check in, pre-test NPO status and medications ordered, and verified current allergies; name and call back number provided for further questions should they arise ? ?Larey Brick RN Navigator Cardiac Imaging ?[facility] ?[phone] office ?[phone] cell ? ?Patient to take 10mg  ivabradine and 100mg  metoprolol tartrate two hours prior to his cardiac CT scan. ?

## 2021-04-16 NOTE — Telephone Encounter (Signed)
Please call patient to review CT instructions for tomorrow.

## 2021-04-16 NOTE — Telephone Encounter (Signed)
Spoke to patient and he stated he was already called by Larey Brick RN and had reviewed his instructions with her. ?

## 2021-04-17 ENCOUNTER — Ambulatory Visit
Admission: RE | Admit: 2021-04-17 | Discharge: 2021-04-17 | Disposition: A | Discharge status: Home / Self Care | Payer: Self-pay | Source: Ambulatory Visit | Attending: Physician Assistant | Admitting: Physician Assistant

## 2021-04-17 ENCOUNTER — Other Ambulatory Visit: Payer: Self-pay

## 2021-04-17 DIAGNOSIS — R072 Precordial pain: Secondary | ICD-10-CM

## 2021-04-17 NOTE — Progress Notes (Signed)
Patient arrived for cardiac CT who took Metoprolol Tartrate 100 mg and Ivabradine 10 mg prior to arrival. Heart rate in the 70's  with Blood pressure Systolic in the [id number]. Patient has no complaints. Doctor Consulting civil engineer notified and ordered Cardiac CT to be cancelled. The doctor's office will contact patient to schedule a different test. Patient verbalized understanding of plan of care. Alert and ambulated with a steady gait to exit with family member in waiting room.  ?

## 2021-04-21 ENCOUNTER — Telehealth: Payer: Self-pay | Admitting: *Deleted

## 2021-04-21 NOTE — Telephone Encounter (Signed)
-----   Message from Rise Mu, PA-C sent at 04/21/2021  7:11 AM EDT ----- ?Regarding: RE: CCTA ?Pam,  ? ?I do not see that he has follow up scheduled. Can you please have him see me to discuss further evaluation of his cardiomyopathy and to escalate GDMT.  ? ?Thanks, ? ?Ryan ?----- Message ----- ?From: Nelva Bush, MD ?Sent: 04/17/2021   5:39 PM EDT ?To: Rise Mu, PA-C, Kate Sable, MD ?Subject: RE: CCTA                                      ? ?Thanks for the update.  Given his severely reduced LVEF, it might be best for [place] to consider cath if CTA is not feasible. ? ?Gerald Stabs ? ?----- Message ----- ?From: Kate Sable, MD ?Sent: 04/17/2021   3:31 PM EST ?To: Nelva Bush, MD, Rise Mu, PA-C ?Subject: CCTA                                          ? ?Hi guys, ? ?Not able to perform CCTA today due to hypotension and non ideal HR BP 94/66, HR 71. ? ?Consider functional testing/ Myoview. ? ?Thks ?BA ? ? ? ?

## 2021-04-21 NOTE — Telephone Encounter (Signed)
Left voicemail message to call back to discuss need for appointment with Eula Listen PA-C ?

## 2021-04-21 NOTE — Telephone Encounter (Signed)
Reply to providers: ? ?Valora Corporal, RN  Dunn, Areta Haber, PA-C; End, Harrell Gave, MD; Kate Sable, MD ? ?I will be happy to reach out and attempt to schedule appointment. Thurmond Butts he has canceled and/or no showed for his appointments as follows:  ? ?03/10/21 Lab appt Canceled  ?03/13/21 CCTA Canceled  ?03/14/21 Lab appt No showed  ?03/20/21 Lab appt No showed  ?03/27/21 CCTA No showed  ?04/11/21 Appt with Dr. Saunders Revel No showed  ?04/17/21 CCTA Canceled  ?

## 2021-04-23 NOTE — Telephone Encounter (Signed)
Phone # is not in service

## 2021-04-24 NOTE — Telephone Encounter (Signed)
Phone # is not in service

## 2021-04-29 ENCOUNTER — Telehealth: Payer: Self-pay | Admitting: Pharmacist

## 2021-04-29 NOTE — Telephone Encounter (Signed)
04/29/2021 10:34:51 AM - Corlanor faxed to [employer] ?-- Tarry Kos - Tuesday, April 29, 2021 10:33 AM --  ?Faxed Corlanor 5mg  to . ?

## 2021-05-03 ENCOUNTER — Inpatient Hospital Stay: Payer: Medicaid Other

## 2021-05-03 ENCOUNTER — Encounter: Payer: Self-pay | Admitting: Emergency Medicine

## 2021-05-03 ENCOUNTER — Emergency Department: Payer: Medicaid Other

## 2021-05-03 ENCOUNTER — Inpatient Hospital Stay
Admission: EM | Admit: 2021-05-03 | Discharge: 2021-05-22 | DRG: 085 | Disposition: A | Discharge status: Hospice | Payer: Medicaid Other | Attending: Internal Medicine | Admitting: Internal Medicine

## 2021-05-03 ENCOUNTER — Other Ambulatory Visit: Payer: Self-pay

## 2021-05-03 DIAGNOSIS — S0990XA Unspecified injury of head, initial encounter: Secondary | ICD-10-CM | POA: Diagnosis not present

## 2021-05-03 DIAGNOSIS — G935 Compression of brain: Secondary | ICD-10-CM | POA: Diagnosis present

## 2021-05-03 DIAGNOSIS — E43 Unspecified severe protein-calorie malnutrition: Secondary | ICD-10-CM | POA: Diagnosis present

## 2021-05-03 DIAGNOSIS — I712 Thoracic aortic aneurysm, without rupture, unspecified: Secondary | ICD-10-CM | POA: Diagnosis present

## 2021-05-03 DIAGNOSIS — Z515 Encounter for palliative care: Secondary | ICD-10-CM

## 2021-05-03 DIAGNOSIS — R0609 Other forms of dyspnea: Secondary | ICD-10-CM | POA: Diagnosis not present

## 2021-05-03 DIAGNOSIS — Z79899 Other long term (current) drug therapy: Secondary | ICD-10-CM

## 2021-05-03 DIAGNOSIS — I82462 Acute embolism and thrombosis of left calf muscular vein: Secondary | ICD-10-CM | POA: Diagnosis not present

## 2021-05-03 DIAGNOSIS — R1310 Dysphagia, unspecified: Secondary | ICD-10-CM | POA: Diagnosis present

## 2021-05-03 DIAGNOSIS — S066X0A Traumatic subarachnoid hemorrhage without loss of consciousness, initial encounter: Principal | ICD-10-CM | POA: Diagnosis present

## 2021-05-03 DIAGNOSIS — I82412 Acute embolism and thrombosis of left femoral vein: Secondary | ICD-10-CM | POA: Diagnosis not present

## 2021-05-03 DIAGNOSIS — I2699 Other pulmonary embolism without acute cor pulmonale: Secondary | ICD-10-CM | POA: Diagnosis not present

## 2021-05-03 DIAGNOSIS — R008 Other abnormalities of heart beat: Secondary | ICD-10-CM | POA: Diagnosis not present

## 2021-05-03 DIAGNOSIS — I619 Nontraumatic intracerebral hemorrhage, unspecified: Secondary | ICD-10-CM | POA: Diagnosis not present

## 2021-05-03 DIAGNOSIS — N179 Acute kidney failure, unspecified: Secondary | ICD-10-CM | POA: Diagnosis not present

## 2021-05-03 DIAGNOSIS — I452 Bifascicular block: Secondary | ICD-10-CM | POA: Diagnosis present

## 2021-05-03 DIAGNOSIS — S065X0A Traumatic subdural hemorrhage without loss of consciousness, initial encounter: Secondary | ICD-10-CM | POA: Diagnosis present

## 2021-05-03 DIAGNOSIS — E512 Wernicke's encephalopathy: Secondary | ICD-10-CM | POA: Diagnosis present

## 2021-05-03 DIAGNOSIS — I5042 Chronic combined systolic (congestive) and diastolic (congestive) heart failure: Secondary | ICD-10-CM | POA: Diagnosis present

## 2021-05-03 DIAGNOSIS — R338 Other retention of urine: Secondary | ICD-10-CM

## 2021-05-03 DIAGNOSIS — E874 Mixed disorder of acid-base balance: Secondary | ICD-10-CM | POA: Diagnosis not present

## 2021-05-03 DIAGNOSIS — J9601 Acute respiratory failure with hypoxia: Secondary | ICD-10-CM | POA: Diagnosis not present

## 2021-05-03 DIAGNOSIS — L89321 Pressure ulcer of left buttock, stage 1: Secondary | ICD-10-CM | POA: Diagnosis present

## 2021-05-03 DIAGNOSIS — L89151 Pressure ulcer of sacral region, stage 1: Secondary | ICD-10-CM | POA: Diagnosis present

## 2021-05-03 DIAGNOSIS — I5022 Chronic systolic (congestive) heart failure: Secondary | ICD-10-CM | POA: Diagnosis present

## 2021-05-03 DIAGNOSIS — I255 Ischemic cardiomyopathy: Secondary | ICD-10-CM | POA: Diagnosis present

## 2021-05-03 DIAGNOSIS — S60512A Abrasion of left hand, initial encounter: Secondary | ICD-10-CM | POA: Diagnosis present

## 2021-05-03 DIAGNOSIS — Z66 Do not resuscitate: Secondary | ICD-10-CM | POA: Diagnosis not present

## 2021-05-03 DIAGNOSIS — E870 Hyperosmolality and hypernatremia: Secondary | ICD-10-CM | POA: Diagnosis not present

## 2021-05-03 DIAGNOSIS — R32 Unspecified urinary incontinence: Secondary | ICD-10-CM | POA: Diagnosis not present

## 2021-05-03 DIAGNOSIS — I110 Hypertensive heart disease with heart failure: Secondary | ICD-10-CM | POA: Diagnosis present

## 2021-05-03 DIAGNOSIS — L8990 Pressure ulcer of unspecified site, unspecified stage: Secondary | ICD-10-CM | POA: Diagnosis present

## 2021-05-03 DIAGNOSIS — E162 Hypoglycemia, unspecified: Secondary | ICD-10-CM | POA: Diagnosis present

## 2021-05-03 DIAGNOSIS — L89311 Pressure ulcer of right buttock, stage 1: Secondary | ICD-10-CM | POA: Diagnosis present

## 2021-05-03 DIAGNOSIS — R296 Repeated falls: Secondary | ICD-10-CM | POA: Diagnosis present

## 2021-05-03 DIAGNOSIS — J690 Pneumonitis due to inhalation of food and vomit: Secondary | ICD-10-CM | POA: Diagnosis not present

## 2021-05-03 DIAGNOSIS — I609 Nontraumatic subarachnoid hemorrhage, unspecified: Secondary | ICD-10-CM | POA: Diagnosis present

## 2021-05-03 DIAGNOSIS — G9341 Metabolic encephalopathy: Secondary | ICD-10-CM | POA: Diagnosis not present

## 2021-05-03 DIAGNOSIS — N170 Acute kidney failure with tubular necrosis: Secondary | ICD-10-CM | POA: Diagnosis not present

## 2021-05-03 DIAGNOSIS — Z809 Family history of malignant neoplasm, unspecified: Secondary | ICD-10-CM

## 2021-05-03 DIAGNOSIS — E875 Hyperkalemia: Secondary | ICD-10-CM | POA: Diagnosis not present

## 2021-05-03 DIAGNOSIS — I82442 Acute embolism and thrombosis of left tibial vein: Secondary | ICD-10-CM | POA: Diagnosis not present

## 2021-05-03 DIAGNOSIS — Y92003 Bedroom of unspecified non-institutional (private) residence as the place of occurrence of the external cause: Secondary | ICD-10-CM | POA: Diagnosis not present

## 2021-05-03 DIAGNOSIS — F10931 Alcohol use, unspecified with withdrawal delirium: Secondary | ICD-10-CM

## 2021-05-03 DIAGNOSIS — I69191 Dysphagia following nontraumatic intracerebral hemorrhage: Secondary | ICD-10-CM

## 2021-05-03 DIAGNOSIS — W1839XA Other fall on same level, initial encounter: Secondary | ICD-10-CM | POA: Diagnosis present

## 2021-05-03 DIAGNOSIS — I10 Essential (primary) hypertension: Secondary | ICD-10-CM | POA: Diagnosis present

## 2021-05-03 DIAGNOSIS — Z789 Other specified health status: Secondary | ICD-10-CM | POA: Diagnosis not present

## 2021-05-03 DIAGNOSIS — E785 Hyperlipidemia, unspecified: Secondary | ICD-10-CM | POA: Diagnosis present

## 2021-05-03 DIAGNOSIS — Y908 Blood alcohol level of 240 mg/100 ml or more: Secondary | ICD-10-CM | POA: Diagnosis present

## 2021-05-03 DIAGNOSIS — Z681 Body mass index (BMI) 19 or less, adult: Secondary | ICD-10-CM

## 2021-05-03 DIAGNOSIS — S0181XA Laceration without foreign body of other part of head, initial encounter: Secondary | ICD-10-CM | POA: Diagnosis present

## 2021-05-03 DIAGNOSIS — I82432 Acute embolism and thrombosis of left popliteal vein: Secondary | ICD-10-CM | POA: Diagnosis not present

## 2021-05-03 DIAGNOSIS — R159 Full incontinence of feces: Secondary | ICD-10-CM | POA: Diagnosis not present

## 2021-05-03 DIAGNOSIS — E871 Hypo-osmolality and hyponatremia: Secondary | ICD-10-CM | POA: Diagnosis not present

## 2021-05-03 DIAGNOSIS — F10231 Alcohol dependence with withdrawal delirium: Secondary | ICD-10-CM | POA: Diagnosis present

## 2021-05-03 DIAGNOSIS — I2781 Cor pulmonale (chronic): Secondary | ICD-10-CM | POA: Diagnosis present

## 2021-05-03 DIAGNOSIS — R52 Pain, unspecified: Secondary | ICD-10-CM | POA: Diagnosis not present

## 2021-05-03 DIAGNOSIS — R6521 Severe sepsis with septic shock: Secondary | ICD-10-CM | POA: Diagnosis not present

## 2021-05-03 DIAGNOSIS — W06XXXA Fall from bed, initial encounter: Secondary | ICD-10-CM | POA: Diagnosis present

## 2021-05-03 DIAGNOSIS — I480 Paroxysmal atrial fibrillation: Secondary | ICD-10-CM | POA: Diagnosis present

## 2021-05-03 DIAGNOSIS — A419 Sepsis, unspecified organism: Secondary | ICD-10-CM | POA: Diagnosis not present

## 2021-05-03 DIAGNOSIS — S065XAA Traumatic subdural hemorrhage with loss of consciousness status unknown, initial encounter: Secondary | ICD-10-CM | POA: Diagnosis not present

## 2021-05-03 DIAGNOSIS — Z20822 Contact with and (suspected) exposure to covid-19: Secondary | ICD-10-CM | POA: Diagnosis present

## 2021-05-03 DIAGNOSIS — R0600 Dyspnea, unspecified: Secondary | ICD-10-CM | POA: Diagnosis not present

## 2021-05-03 DIAGNOSIS — F17210 Nicotine dependence, cigarettes, uncomplicated: Secondary | ICD-10-CM | POA: Diagnosis present

## 2021-05-03 DIAGNOSIS — F1027 Alcohol dependence with alcohol-induced persisting dementia: Secondary | ICD-10-CM | POA: Diagnosis present

## 2021-05-03 DIAGNOSIS — R4182 Altered mental status, unspecified: Secondary | ICD-10-CM | POA: Diagnosis not present

## 2021-05-03 DIAGNOSIS — E8809 Other disorders of plasma-protein metabolism, not elsewhere classified: Secondary | ICD-10-CM | POA: Diagnosis present

## 2021-05-03 DIAGNOSIS — Z833 Family history of diabetes mellitus: Secondary | ICD-10-CM

## 2021-05-03 DIAGNOSIS — F10229 Alcohol dependence with intoxication, unspecified: Secondary | ICD-10-CM | POA: Diagnosis present

## 2021-05-03 DIAGNOSIS — E876 Hypokalemia: Secondary | ICD-10-CM

## 2021-05-03 DIAGNOSIS — R402412 Glasgow coma scale score 13-15, at arrival to emergency department: Secondary | ICD-10-CM | POA: Diagnosis present

## 2021-05-03 DIAGNOSIS — E861 Hypovolemia: Secondary | ICD-10-CM | POA: Diagnosis not present

## 2021-05-03 DIAGNOSIS — K117 Disturbances of salivary secretion: Secondary | ICD-10-CM | POA: Diagnosis not present

## 2021-05-03 DIAGNOSIS — Z9181 History of falling: Secondary | ICD-10-CM

## 2021-05-03 HISTORY — DX: Paroxysmal atrial fibrillation: I48.0

## 2021-05-03 HISTORY — DX: Unspecified systolic (congestive) heart failure: I50.20

## 2021-05-03 LAB — HEPATIC FUNCTION PANEL
ALT: 24 U/L (ref 0–44)
AST: 47 U/L — ABNORMAL HIGH (ref 15–41)
Albumin: 3.3 g/dL — ABNORMAL LOW (ref 3.5–5.0)
Alkaline Phosphatase: 127 U/L — ABNORMAL HIGH (ref 38–126)
Bilirubin, Direct: 0.3 mg/dL — ABNORMAL HIGH (ref 0.0–0.2)
Indirect Bilirubin: 0.8 mg/dL (ref 0.3–0.9)
Total Bilirubin: 1.1 mg/dL (ref 0.3–1.2)
Total Protein: 7.6 g/dL (ref 6.5–8.1)

## 2021-05-03 LAB — CBC
HCT: 35.2 % — ABNORMAL LOW (ref 39.0–52.0)
Hemoglobin: 11.9 g/dL — ABNORMAL LOW (ref 13.0–17.0)
MCH: 33.1 pg (ref 26.0–34.0)
MCHC: 33.8 g/dL (ref 30.0–36.0)
MCV: 98.1 fL (ref 80.0–100.0)
Platelets: 115 10*3/uL — ABNORMAL LOW (ref 150–400)
RBC: 3.59 MIL/uL — ABNORMAL LOW (ref 4.22–5.81)
RDW: 13.9 % (ref 11.5–15.5)
WBC: 4.8 10*3/uL (ref 4.0–10.5)
nRBC: 0 % (ref 0.0–0.2)

## 2021-05-03 LAB — ETHANOL: Alcohol, Ethyl (B): 292 mg/dL — ABNORMAL HIGH (ref <10)

## 2021-05-03 LAB — BASIC METABOLIC PANEL
Anion gap: 16 — ABNORMAL HIGH (ref 5–15)
BUN: 8 mg/dL (ref 8–23)
CO2: 25 mmol/L (ref 22–32)
Calcium: 8.5 mg/dL — ABNORMAL LOW (ref 8.9–10.3)
Chloride: 98 mmol/L (ref 98–111)
Creatinine, Ser: 0.8 mg/dL (ref 0.61–1.24)
GFR, Estimated: >60 mL/min (ref >60)
Glucose, Bld: 86 mg/dL (ref 70–99)
Potassium: 2.8 mmol/L — ABNORMAL LOW (ref 3.5–5.1)
Sodium: 139 mmol/L (ref 135–145)

## 2021-05-03 LAB — RESP PANEL BY RT-PCR (FLU A&B, COVID) ARPGX2
Influenza A by PCR: NEGATIVE
Influenza B by PCR: NEGATIVE
SARS Coronavirus 2 by RT PCR: NEGATIVE

## 2021-05-03 LAB — PROTIME-INR
INR: 1 (ref 0.8–1.2)
Prothrombin Time: 13.5 seconds (ref 11.4–15.2)

## 2021-05-03 LAB — PHOSPHORUS: Phosphorus: 3.5 mg/dL (ref 2.5–4.6)

## 2021-05-03 LAB — MRSA NEXT GEN BY PCR, NASAL: MRSA by PCR Next Gen: NOT DETECTED

## 2021-05-03 LAB — MAGNESIUM: Magnesium: 1.9 mg/dL (ref 1.7–2.4)

## 2021-05-03 LAB — TROPONIN I (HIGH SENSITIVITY): Troponin I (High Sensitivity): 12 ng/L (ref <18)

## 2021-05-03 MED ORDER — CHLORDIAZEPOXIDE HCL 25 MG PO CAPS: 25.0000 mg | ORAL_CAPSULE | Freq: Three times a day (TID) | ORAL | Status: DC

## 2021-05-03 MED ORDER — LABETALOL HCL 5 MG/ML IV SOLN: 10.0000 mg | INTRAVENOUS | Status: DC | PRN

## 2021-05-03 MED ORDER — CHLORDIAZEPOXIDE HCL 25 MG PO CAPS
25.0000 mg | ORAL_CAPSULE | Freq: Once | ORAL | Status: AC
  Administered 2021-05-03: 25 mg via ORAL
  Filled 2021-05-03: qty 1

## 2021-05-03 MED ORDER — THIAMINE HCL 100 MG/ML IJ SOLN
100.0000 mg | Freq: Every day | INTRAMUSCULAR | Status: DC
  Administered 2021-05-13: 100 mg via INTRAVENOUS
  Filled 2021-05-03: qty 2

## 2021-05-03 MED ORDER — FOLIC ACID 1 MG PO TABS
1.0000 mg | ORAL_TABLET | Freq: Every day | ORAL | Status: DC
  Administered 2021-05-03 – 2021-05-13 (×11): 1 mg via ORAL
  Filled 2021-05-03 (×11): qty 1

## 2021-05-03 MED ORDER — CHLORDIAZEPOXIDE HCL 25 MG PO CAPS: 25.0000 mg | ORAL_CAPSULE | Freq: Every day | ORAL | Status: DC

## 2021-05-03 MED ORDER — MAGNESIUM SULFATE 2 GM/50ML IV SOLN
2.0000 g | Freq: Once | INTRAVENOUS | Status: AC
  Administered 2021-05-03: 2 g via INTRAVENOUS
  Filled 2021-05-03: qty 50

## 2021-05-03 MED ORDER — STROKE: EARLY STAGES OF RECOVERY BOOK: Freq: Once | Status: AC

## 2021-05-03 MED ORDER — THIAMINE HCL 100 MG/ML IJ SOLN
250.0000 mg | Freq: Every day | INTRAVENOUS | Status: DC
  Administered 2021-05-06 – 2021-05-07 (×2): 250 mg via INTRAVENOUS
  Filled 2021-05-03 (×2): qty 2.5

## 2021-05-03 MED ORDER — ONDANSETRON HCL 4 MG/2ML IJ SOLN
INTRAMUSCULAR | Status: AC
  Filled 2021-05-03: qty 2

## 2021-05-03 MED ORDER — DOCUSATE SODIUM 100 MG PO CAPS
100.0000 mg | ORAL_CAPSULE | Freq: Two times a day (BID) | ORAL | Status: DC | PRN
  Administered 2021-05-04: 100 mg via ORAL
  Filled 2021-05-03: qty 1

## 2021-05-03 MED ORDER — LORAZEPAM 2 MG/ML IJ SOLN: 1.0000 mg | INTRAMUSCULAR | Status: DC | PRN

## 2021-05-03 MED ORDER — ONDANSETRON HCL 4 MG/2ML IJ SOLN
4.0000 mg | Freq: Four times a day (QID) | INTRAMUSCULAR | Status: DC | PRN
  Administered 2021-05-03 – 2021-05-15 (×3): 4 mg via INTRAVENOUS
  Filled 2021-05-03 (×2): qty 2

## 2021-05-03 MED ORDER — LORAZEPAM 2 MG/ML IJ SOLN
1.0000 mg | INTRAMUSCULAR | Status: DC | PRN
  Administered 2021-05-08 (×3): 2 mg via INTRAVENOUS
  Filled 2021-05-03 (×4): qty 1

## 2021-05-03 MED ORDER — CHLORDIAZEPOXIDE HCL 25 MG PO CAPS: 25.0000 mg | ORAL_CAPSULE | ORAL | Status: DC

## 2021-05-03 MED ORDER — POTASSIUM CHLORIDE CRYS ER 20 MEQ PO TBCR: 20.0000 meq | EXTENDED_RELEASE_TABLET | Freq: Once | ORAL | Status: DC

## 2021-05-03 MED ORDER — THIAMINE HCL 100 MG/ML IJ SOLN
500.0000 mg | Freq: Three times a day (TID) | INTRAVENOUS | Status: AC
  Administered 2021-05-03 – 2021-05-05 (×6): 500 mg via INTRAVENOUS
  Filled 2021-05-03 (×6): qty 5

## 2021-05-03 MED ORDER — LORAZEPAM 1 MG PO TABS: 1.0000 mg | ORAL_TABLET | ORAL | Status: DC | PRN

## 2021-05-03 MED ORDER — CHLORDIAZEPOXIDE HCL 25 MG PO CAPS: 25.0000 mg | ORAL_CAPSULE | Freq: Four times a day (QID) | ORAL | Status: DC

## 2021-05-03 MED ORDER — PANTOPRAZOLE SODIUM 40 MG IV SOLR
40.0000 mg | Freq: Every day | INTRAVENOUS | Status: DC
  Administered 2021-05-03: 40 mg via INTRAVENOUS
  Filled 2021-05-03: qty 10

## 2021-05-03 MED ORDER — CHLORDIAZEPOXIDE HCL 25 MG PO CAPS
25.0000 mg | ORAL_CAPSULE | Freq: Four times a day (QID) | ORAL | Status: DC
Start: 2021-05-04 — End: 2021-05-04
  Administered 2021-05-04: 25 mg via ORAL
  Filled 2021-05-03: qty 1

## 2021-05-03 MED ORDER — CHLORDIAZEPOXIDE HCL 25 MG PO CAPS
25.0000 mg | ORAL_CAPSULE | Freq: Four times a day (QID) | ORAL | Status: AC | PRN
  Administered 2021-05-04: 25 mg via ORAL
  Filled 2021-05-03: qty 1

## 2021-05-03 MED ORDER — THIAMINE HCL 100 MG PO TABS
100.0000 mg | ORAL_TABLET | Freq: Every day | ORAL | Status: DC
Start: 2021-05-12 — End: 2021-05-14
  Administered 2021-05-12 – 2021-05-14 (×2): 100 mg via ORAL
  Filled 2021-05-03 (×2): qty 1

## 2021-05-03 MED ORDER — POLYETHYLENE GLYCOL 3350 17 G PO PACK: 17.0000 g | PACK | Freq: Every day | ORAL | Status: DC | PRN

## 2021-05-03 MED ORDER — LORAZEPAM 2 MG/ML IJ SOLN: 2.0000 mg | INTRAMUSCULAR | Status: DC | PRN

## 2021-05-03 MED ORDER — ADULT MULTIVITAMIN W/MINERALS CH
1.0000 | ORAL_TABLET | Freq: Every day | ORAL | Status: DC
  Administered 2021-05-03 – 2021-05-13 (×11): 1 via ORAL
  Filled 2021-05-03 (×11): qty 1

## 2021-05-03 MED ORDER — ACETAMINOPHEN 325 MG PO TABS
650.0000 mg | ORAL_TABLET | ORAL | Status: DC | PRN
  Administered 2021-05-04: 650 mg via ORAL
  Filled 2021-05-03 (×2): qty 2

## 2021-05-03 MED ORDER — POTASSIUM CHLORIDE CRYS ER 20 MEQ PO TBCR
40.0000 meq | EXTENDED_RELEASE_TABLET | Freq: Once | ORAL | Status: AC
  Administered 2021-05-03: 40 meq via ORAL
  Filled 2021-05-03: qty 2

## 2021-05-03 MED ORDER — POTASSIUM CHLORIDE 10 MEQ/100ML IV SOLN
10.0000 meq | INTRAVENOUS | Status: AC
  Administered 2021-05-03 – 2021-05-04 (×3): 10 meq via INTRAVENOUS
  Filled 2021-05-03 (×3): qty 100

## 2021-05-03 NOTE — H&P (Signed)
? ?NAME:  Charles Schmitt., MRN:  [id number], DOB:  [birth year], LOS: 0 ?ADMISSION DATE:  05/03/2021, CONSULTATION DATE:  05/03/21 ?REFERRING MD:  Dr. Kerman Passey, CHIEF COMPLAINT:  Fall  ? ?History of Present Illness:  ?[name] presenting to [facility] ED from home via EMS after heavy drinking these past 2 weeks and multiple falls at home. The patient is unsure why he needed to come to the hospital, but does admit he fell out of bed and had difficulty getting up this morning because of weakness in his legs. ? ?In talking to his significant other, Charles Schmitt, who he lives with via the telephone- she explained that he had fallen out of bed twice last night and she had difficulty getting him back into bed because he was unable to help her. She reports that he had been "cutting back" on his alcohol consumption, drinking "a beer occasionally" for about 2 weeks. Then 2 weeks ago, 04/14/21?, he reverted to drinking "heavily". The patient describes drinking a 1/2 gallon of vodka every 3 days and Charles Schmitt, who he lives with confirmed that sounded right. Of note, in talking to Charles Schmitt", the patient's daughter in law, she reports that his normal drinking habits are equal to a 1/2 gallon of vodka daily. Neither relative is sure why the change but that over the past 2 weeks he has been incontinent, confused, laying around the house drinking. Charles Schmitt reports that when he cut back she didn't notice any severe withdrawal symptoms but reports that he has been vomiting and not eating any solid food for the past 3 weeks or so. They confirmed the patient smokes when he drinks, averaging 4 cigarettes daily. ?Bedside the patient's only complaint was tenderness on his tailbone. ? ?ED course: ?CTH showed significant intracranial bleeding, see below. Patient awake and repsonsive, MAE on room air. EDP spoke with Dr. Izora Ribas with neurosurgery who recommended ICU admission for monitoring during acute phase. If patient deteriorates will evaluate for urgent  drain placement with a follow-up CTH at midnight. ?Medications given: N/a ?Initial Vitals: afebrile 98.5, RR- 20, NSR- 98, BP- 117/83, SpO2 99% on RA ?Significant labs: (Labs/ Imaging personally reviewed) ?I, [name], AGACNP-BC, personally viewed and interpreted this ECG. ?EKG Interpretation: Date: 05/03/2021, EKG Time: 1737, Rate: 96,  ?Rhythm: NSR with bifascicular block, QRS Axis:  LAD ?Intervals: RBBB, LAFB, ST/T Wave abnormalities: mild septal ST depression, non specific lateral T wave inversions, Narrative Interpretation: NSR with bifascicular block, possible old septal infarct/ non specific lateral t wave inversions (slight progression from EKG 03/03/21 from incomplete RBBB to complete, prolonged Qtc resolved, T wave inversions similar to prior) ?Chemistry: Na+:139, K+: 2.8, BUN/Cr.: 8/ 0.80, Serum CO2/ AG: 25/16, alk phos: 127, AST: 47 ?Hematology: WBC: 4.8, Hgb: 11.9,  ?Troponin: 12, PT: 13.5, INR: 1.0 ?COVID-19 & Influenza A/B: negative ?UA & UDS: pending ? ?CT head wo contrast 05/03/21: Acute right frontoparietal subdural hematoma measuring 11 mm in thickness, with mild right to left midline shift measuring approximately 4 mm. Three small intraparenchymal hemorrhagic contusions in high right frontal lobe along the cerebral convexity, with mild associated edema but no significant mass effect. Acute bilateral subarachnoid hemorrhage, right side greater than left, as described above. ?  ?CT Lumbar spine wo contrast 05/03/21: No acute fracture or static subluxation of the lumbar spine. Hepatic steatosis. ? ?PCCM consulted for admission due to significant intracranial bleeding requiring ICU monitoring with high risk for deterioration, emergent neurosurgical intervention and/or intubation. ? ?Pertinent  Medical History  ?ETOH  abuse ?HFrEF ( echo 12/22: LVEF 30-35%, LV global hypokinesis, apical akinesis, RV fxn mildly reduced. ) ?PAF ?HTN ?HLD ?Anemia ?Tobacco use ?Cardiomyopathy ? ?Significant  Hospital Events: ?Including procedures, antibiotic start and stop dates in addition to other pertinent events   ?05/03/21: Admit to ICU with traumatic large subdural hematoma causing 4 mm midline shift, multiple intraparenchymal hemorrhagic contusions and bilateral SAH requiring frequent neurological monitoring. ? ?Interim History / Subjective:  ?Patient awake and alert, but has difficulty remembering recent events and is a poor historian. Does admit to falling out of bed, and having multiple falls over the past few weeks. He admits to drinking vodka, but denies using tobacco products, or using any other recreational substances. ?Patient can follow commands, but does require persistent redirection in order to complete the task. ? ?Objective   ?Blood pressure 117/83, pulse 98, temperature 98.5 ?F (36.9 ?C), temperature source Oral, resp. rate 20, height '6\' 5"'  (1.956 m), weight 81.6 kg, SpO2 99 %. ?   ?   ?No intake or output data in the 24 hours ending 05/03/21 1929 ?Filed Weights  ? 05/03/21 1733  ?Weight: 81.6 kg  ? ? ?Examination: ?General: Adult male, critically ill, lying in bed, NAD ?HEENT: MM pink/moist, anicteric, atraumatic, neck supple ?Neuro: A&O x 4, able to follow commands, PERRL +3, MAE ?CV: s1s2 RRR, NSR on monitor, no r/m/g ?Pulm: Regular, non labored on room air, breath sounds clear-BUL & diminished-BLL ?GI: soft, rounded, non tender, bs x 4 ?Skin: multiple scattered bruises on body in various stages, scabbed cuts over and under R eye, scabbed abrasion on L hand noted ?Extremities: warm/dry, pulses + 2 R/P, no edema noted ? ?Resolved Hospital Problem list   ? ? ?Assessment & Plan:  ?Acute Traumatic Large Subdural hematoma, Bilateral SAH & three intraparenchymal hemorrhagic contusions secondary to multiple falls at home ?- consult to Neurosurgery, Dr. Izora Ribas, gave recommendations via telephone and later assessed the patient bedside - appreciate input ?- SBP goal < 160, labetalol PRN ?- NIHSS ?-  Neuro checks Q 2, any change in mentation - STAT CTH ?- f/u CTH wo contrast at midnight to monitor evolution of bleed ?- Bedside swallow, PT/OT/SLP consulted once stabilized ?- strict bedrest ?- falls & aspiration precautions ? ?Alcohol Abuse with high risk for Alcohol Withdrawal Symptoms ?Tobacco Use ?Blood alcohol: 292, patient drinks possibly as much as 1/2 gallon of vodka daily. Discussed with Dr. Jonnie Finner and Dr. Izora Ribas who are in agreement in starting a low dose librium taper, with the hopes of not overly sedating the patient while hopefully proactively treating his alcohol withdrawal symptoms. ?- librium taper 25 mg loading dose followed by taper  ?- CIWA per protocol, librium Q 6 PRN for CIWA > 10 ?- ativan IV PRN for severe symptoms/seizure activity ?- TOC consult for substance abuse services ?- high dose thiamine supplementation with taper ?- folic acid & multivitamin daily ?- smoking cessation counseling provided ? ?Hypokalemia ?Hypoalbuminemia ?At risk for refeeding syndrome ?- K+ supplementation ?- closely follow electrolytes, daily BMP, replace electrolytes PRN ?- consult dietary ? ?Mild Transaminitis in the setting of ETOH abuse ?Alk Phos trending down from Jan visit, but AST acutely elevated, no c/o abdominal discomfort. Does admit to vomiting, but says he can't stand the smell of food and the patient has been cutting back on his drinking- then began drinking heavily again. ?- Trend hepatic function ?- Consider RUQ US/CT abdomen/pelvis depending on trend ?- avoid hepatotoxic agents ? ?Chronic HFrEF without exacerbation ?Chronic PAF  not on anticoagulation ?Cardiomyopathy  ?HTN ?- continuous cardiac monitoring ?- outpatient metoprolol & aldactone on hold overnight - if needed control HTN with labetalol IV PRN, consider restarting as patient stabilizes ? ?Best Practice (right click and "Reselect all SmartList Selections" daily)  ?Diet/type: NPO w/ oral meds ?DVT prophylaxis: SCD ?GI prophylaxis:  PPI ?Lines: N/A ?Foley:  N/A ?Code Status:  full code ?Last date of multidisciplinary goals of care discussion [05/03/21] ? ?Updated Charles Schmitt", the patient's daughter-in-law via telephone. Also spoke with t

## 2021-05-03 NOTE — ED Notes (Signed)
Patient transported to CT 

## 2021-05-03 NOTE — ED Provider Notes (Signed)
? ?[facility] ?Provider Note ? ? ? Event Date/Time  ? First MD Initiated Contact with Patient 05/03/21 1754   ?  (approximate) ? ?History  ? ?Chief Complaint: Fall ? ?HPI ? ?Charles Schmitt. is a 66 y.o. male with a past medical history of alcohol abuse, hypertension, hyperlipidemia, CHF, presents to the emergency department for weakness.  According to the patient he admits to drinking alcohol earlier today, states he had several falls and was having weakness in his legs.  Patient states he thinks he just drank too much today.  When asked why he came patient states his wife called EMS to bring him here but he is not entirely sure why.  Currently awaiting wife's arrival.  Patient's only pain complaint is some pain to his left lateral ribs which he states started after a fall. ? ?Physical Exam  ? ?Triage Vital Signs: ?ED Triage Vitals  ?Enc Vitals Group  ?   BP 05/03/21 1734 117/83  ?   Pulse Rate 05/03/21 1734 98  ?   Resp 05/03/21 1734 20  ?   Temp 05/03/21 1734 98.5 ?F (36.9 ?C)  ?   Temp Source 05/03/21 1734 Oral  ?   SpO2 05/03/21 1734 99 %  ?   Weight 05/03/21 1733 180 lb (81.6 kg)  ?   Height 05/03/21 1733 6\' 5"  (1.956 m)  ?   Head Circumference --   ?   Peak Flow --   ?   Pain Score 05/03/21 1733 10  ?   Pain Loc --   ?   Pain Edu? --   ?   Excl. in GC? --   ? ? ?Most recent vital signs: ?Vitals:  ? 05/03/21 1734  ?BP: 117/83  ?Pulse: 98  ?Resp: 20  ?Temp: 98.5 ?F (36.9 ?C)  ?SpO2: 99%  ? ? ?General: Awake, no distress.  ?CV:  Good peripheral perfusion.  Regular rate and rhythm  ?Resp:  Normal effort.  Equal breath sounds bilaterally.  ?Abd:  No distention.  Soft, nontender.  No rebound or guarding. ?Other:  4+/5 strength in bilateral upper and lower extremities. ? ? ?ED Results / Procedures / Treatments  ? ?EKG ? ?EKG viewed and interpreted by myself shows a normal sinus rhythm at 96 bpm with a narrow QRS, left axis deviation, largely normal intervals and nonspecific ST  changes. ? ?RADIOLOGY ? ?I personally seen the CT images of the head.  Patient appears to have significant bleed on the right side. ?CT shows acute right frontoparietal subdural 11 mm with 4 mm of midline shift.  3 intraparenchymal hemorrhagic contusions and bilateral subarachnoid hemorrhage. ? ? ?MEDICATIONS ORDERED IN ED: ?Medications - No data to display ? ? ?IMPRESSION / MDM / ASSESSMENT AND PLAN / ED COURSE  ?I reviewed the triage vital signs and the nursing notes. ? ?Patient presents emergency department for increased falls and weakness.  Patient does admit to frequent alcohol use including today.  Patient has some mild to moderate left lateral rib discomfort from a fall.  We will obtain x-ray images to rule out rib fracture as well as pneumothorax.  We will check labs including ethanol level, troponin and obtain an EKG.  We will continue to closely monitor in the emergency department.  Given the patient's frequent falls we will also obtain a CT scan head rule out intracranial abnormality.  Patient agreeable to plan of care.  Awaiting wife arrival for further history. ? ?CT shows significant intracranial  bleed including subdural subarachnoid and intraparenchymal bleed.  Patient remains awake alert.  Recheck of neuro exam continues to show 4+ strength in all extremities.  Patient is asking for a phone to call his wife.  I spoke to Dr. Marcell Barlow of neurosurgery who believes the patient is safe to admit locally to the ICU but may require a drain at some point.  Patient's lab work shows significant alcohol elevation 292.  Chemistry shows mild hypokalemia.  I spoke to intensivist who will be down shortly to see the patient and admit to the ICU.  I have added on PT/INR given the patient's chronic alcoholism. ? ?Troponin negative.  CBC nonrevealing.  I reviewed the patient's past medical records including Care Everywhere records his last PCP note was from 12/21/2017 however it does not appear to be on any  anticoagulation. ? ?CRITICAL CARE ?Performed by: Minna Antis ? ? ?Total critical care time: 30 minutes ? ?Critical care time was exclusive of separately billable procedures and treating other patients. ? ?Critical care was necessary to treat or prevent imminent or life-threatening deterioration. ? ?Critical care was time spent personally by me on the following activities: development of treatment plan with patient and/or surrogate as well as nursing, discussions with consultants, evaluation of patient's response to treatment, examination of patient, obtaining history from patient or surrogate, ordering and performing treatments and interventions, ordering and review of laboratory studies, ordering and review of radiographic studies, pulse oximetry and re-evaluation of patient's condition. ? ? ?FINAL CLINICAL IMPRESSION(S) / ED DIAGNOSES  ? ?Alcohol use disorder ?Weakness ?Subdural hematoma ?Subarachnoid hemorrhage ?Intraparenchymal hemorrhage ?Alcohol intoxication ?Hypokalemia ? ?Note:  This document was prepared using Dragon voice recognition software and may include unintentional dictation errors. ?  Minna Antis, MD ?05/03/21 2022 ? ?

## 2021-05-03 NOTE — Consult Note (Signed)
PHARMACY CONSULT NOTE - FOLLOW UP ? ?Pharmacy Consult for Electrolyte Monitoring and Replacement  ? ?Recent Labs: ?Potassium (mmol/L)  ?Date Value  ?05/03/2021 2.8 (L)  ? ?Magnesium (mg/dL)  ?Date Value  ?02/08/2021 1.8  ? ?Calcium (mg/dL)  ?Date Value  ?05/03/2021 8.5 (L)  ? ?Albumin (g/dL)  ?Date Value  ?03/03/2021 3.0 (L)  ? ?Phosphorus (mg/dL)  ?Date Value  ?02/04/2021 4.1  ? ?Sodium (mmol/L)  ?Date Value  ?05/03/2021 139  ?03/03/2021 145 (H)  ? ?Corrected Calcium: 9.3 mg/dL ? ?Assessment: ?66yo M presents to ED with complaint of increased falls last couple of months, most recent being two days ago. Patient also reports increased alcohol intake, most recent drink being a few hours ago. Pharmacy consulted for electrolyte monitoring and replacement.  ? ?Goal of Therapy:  ?Electrolytes WNL ? ?Plan:  ?Give KCL PO x1 followed by KCL IV x1 (total ) repletion.  ?Other labs WNL ?Collect BMP, Mg, and Phos tomorrrow with AM labs ? ?Selinda Eon ,PharmD ?Clinical Pharmacist ?05/03/2021 7:30 PM  ?

## 2021-05-03 NOTE — ED Triage Notes (Signed)
Pt via EMS from home. Pt c/o fall that happened 2 days ago. Pt states he has been having increased falls the last couple months, pt also states he has been increasing his ETOH intake. Last drink was a couple of hours ago. VSS per EMS. Pt c/o sacral pain. Pt also has multiple brusing and a small healed lac on the R cheek. Pt is A&OX4 and NAD.  ?

## 2021-05-03 NOTE — Consult Note (Signed)
? ?Referring Physician:  ?No referring provider defined for this encounter. ? ?Primary Physician:  ?Pcp, No ? ?Chief Complaint:  falls, subdural hematoma ? ?History of Present Illness: ?05/03/2021 ?Charles Schmitt. is a 66 y.o. male who presents with the chief complaint of multiple falls due to alcohol intoxication.  EMS was called due to a change in status.  He was not very mobile in ER, and had multiple lacerations on his face and head of different ages, so HCT was performed.  This showed an acute SDH. ? ?The patient has since sobered, though he is not completely sober, and is able to answer questions.  He has no HA, N, or V.  He denies weakness.  He cannot provide further history. ? ?He reports he had a couple of shots of vodka today.  His family reports his alcohol intake is significant, potentially a half gallon a day. ? ?Review of Systems:  ?A 10 point review of systems is negative, except for the pertinent positives and negatives detailed in the HPI. ? ?Past Medical History: ?Past Medical History:  ?Diagnosis Date  ? Alcohol abuse   ? a. 1/5 of vodka daily.  ? Cardiomyopathy (HCC)   ? a. 01/2021 Echo: EF 30-35%, glob HK, apical AK. Mildly reduced RV fxn.  ? HFrEF (heart failure with reduced ejection fraction) (HCC)   ? Hyperlipidemia   ? Hypertension   ? PAF (paroxysmal atrial fibrillation) (HCC)   ? Tobacco abuse   ? ? ?Past Surgical History: ?Past Surgical History:  ?Procedure Laterality Date  ? NO PAST SURGERIES    ? ? ?Allergies: ?Allergies as of 05/03/2021  ? (No Known Allergies)  ? ? ?Medications: ? ?Current Facility-Administered Medications:  ?  acetaminophen (TYLENOL) tablet 650 mg, 650 mg, Oral, Q4H PRN, Rust-Doak Mah, Micheline Rough L, NP ?  chlordiazePOXIDE (LIBRIUM) capsule 25 mg, 25 mg, Oral, Q6H PRN, Rust-Kelbie Moro, Micheline Rough L, NP ?  chlordiazePOXIDE (LIBRIUM) capsule 25 mg, 25 mg, Oral, Once, Rust-Erynn Vaca, Cecelia Byars, NP ?  [START ON 05/04/2021] chlordiazePOXIDE (LIBRIUM) capsule 25 mg, 25 mg, Oral, QID  **FOLLOWED BY** [START ON 05/05/2021] chlordiazePOXIDE (LIBRIUM) capsule 25 mg, 25 mg, Oral, TID **FOLLOWED BY** [START ON 05/06/2021] chlordiazePOXIDE (LIBRIUM) capsule 25 mg, 25 mg, Oral, BH-qamhs **FOLLOWED BY** [START ON 05/07/2021] chlordiazePOXIDE (LIBRIUM) capsule 25 mg, 25 mg, Oral, Daily, Rust-Aarish Rockers, Micheline Rough L, NP ?  docusate sodium (COLACE) capsule 100 mg, 100 mg, Oral, BID PRN, Rust-Quy Lotts, Micheline Rough L, NP ?  folic acid (FOLVITE) tablet 1 mg, 1 mg, Oral, Daily, Rust-Sheena Simonis, Britton L, NP ?  labetalol (NORMODYNE) injection 10 mg, 10 mg, Intravenous, Q2H PRN, Rust-Natale Barba, Cecelia Byars, NP ?  LORazepam (ATIVAN) injection 2 mg, 2 mg, Intravenous, Q1H PRN, Rust-Sherley Leser, Micheline Rough L, NP ?  multivitamin with minerals tablet 1 tablet, 1 tablet, Oral, Daily, Rust-Larine Fielding, Britton L, NP ?  ondansetron (ZOFRAN) 4 MG/2ML injection, , , ,  ?  ondansetron (ZOFRAN) injection 4 mg, 4 mg, Intravenous, Q6H PRN, Rust-Alyla Pietila, Britton L, NP, 4 mg at 05/03/21 2052 ?  pantoprazole (PROTONIX) injection 40 mg, 40 mg, Intravenous, QHS, Rust-Jevonte Clanton, Britton L, NP ?  polyethylene glycol (MIRALAX / GLYCOLAX) packet 17 g, 17 g, Oral, Daily PRN, Rust-Auri Jahnke, Britton L, NP ?  potassium chloride 10 mEq in 100 mL IVPB, 10 mEq, Intravenous, Q1 Hr x 4, Selinda Eon, [facility], Last Rate: 100 mL/hr at 05/03/21 2114, 10 mEq at 05/03/21 2114 ?  potassium chloride SA (KLOR-CON M) CR tablet 40 mEq, 40 mEq, Oral, Once, Rust-Dahl Higinbotham, Britton L,  NP ?  thiamine 500mg  in normal saline (27ml) IVPB, 500 mg, Intravenous, Q8H, Last Rate: 100 mL/hr at 05/03/21 2200, 500 mg at 05/03/21 2200 **FOLLOWED BY** [START ON 05/06/2021] thiamine (B-1) 250 mg in sodium chloride 0.9 % 50 mL IVPB, 250 mg, Intravenous, Daily, Rust-Prestyn Mahn, 05/08/2021, NP ?  [START ON 05/12/2021] thiamine (B-1) injection 100 mg, 100 mg, Intravenous, Daily **OR** [START ON 05/12/2021] thiamine tablet 100 mg, 100 mg, Oral, Daily, Rust-Anjela Cassara, 07/12/2021, NP ? ? ?Social History: ?Social History   ? ?Tobacco Use  ? Smoking status: Some Days  ?  Packs/day: 0.25  ?  Years: 40.00  ?  Pack years: 10.00  ?  Types: Cigarettes  ? Smokeless tobacco: Never  ?Vaping Use  ? Vaping Use: Never used  ?Substance Use Topics  ? Alcohol use: Yes  ?  Comment: 1/5 vodka per day  ? Drug use: Not Currently  ? ? ?Family Medical History: ?Family History  ?Problem Relation Age of Onset  ? Cancer Father   ? Diabetes Father   ? ? ?Physical Examination: ?Vitals:  ? 05/03/21 2000 05/03/21 2045  ?BP: (!) 144/101 (!) 139/104  ?Pulse: 86 93  ?Resp: (!) 26 15  ?Temp:  99 ?F (37.2 ?C)  ?SpO2: 100% 99%  ? ? ? ?General: Patient is thin, with multiple lacerations and bruises noted. ? ?Psychiatric: Patient is non-anxious. ? ?Head:  Pupils equal, round, and reactive to light. ? ?ENT:  Oral mucosa appears well hydrated. ? ?Neck:   Supple.  Full range of motion. ? ?Respiratory: Patient is breathing without any difficulty. ? ?Extremities: No edema. ? ?Vascular: Palpable pulses in dorsal pedal vessels. ? ?Skin:   On exposed skin, there are no abnormal skin lesions. ? ?NEUROLOGICAL:  ?General: In no acute distress.   ?Awake, alert, oriented to person, place, and time.  Pupils equal round and reactive to light.  Facial tone is symmetric.  Tongue protrusion is midline.  There is no pronator drift. ? ? ?Strength: ?Side Biceps Triceps Deltoid Interossei Grip Wrist Ext. Wrist Flex.  ?R 5 5 5 5 5 5 5   ?L 5 5 5 5 5 5 5   ? ?Side Iliopsoas Quads Hamstring PF DF EHL  ?R 5 5 5 5 5 5   ?L 5 5 5 5 5 5   ? ? ?Bilateral upper and lower extremity sensation is intact to light touch. ?Reflexes are 1+ and symmetric at the biceps, triceps, brachioradialis, patella and achilles. Hoffman's is absent. ? ?Gait is untested. ?Imaging: ?CT Head 05/03/21 ?  ?IMPRESSION: ?Acute right frontoparietal subdural hematoma measuring 11 mm in ?thickness, with mild right to left midline shift measuring ?approximately 4 mm. ?  ?Three small intraparenchymal hemorrhagic contusions in high  right ?frontal lobe along the cerebral convexity, with mild associated ?edema but no significant mass effect. ?  ?Acute bilateral subarachnoid hemorrhage, right side greater than ?left, as described above. ?  ?Critical Value/emergent results were called by telephone at the time ?of interpretation on 05/03/2021 at 6:35 pm to provider [facility] ?[email or web address] , who verbally acknowledged these results. ?  ?  ?Electronically Signed ?  By: M.D. ?  On: 05/03/2021 18:35  ? ?I have personally reviewed the images and agree with the above interpretation. ? ?Labs: ? ?  Latest Ref Rng & Units 05/03/2021  ?  7:37 PM 03/03/2021  ? 10:59 AM 02/10/2021  ?  5:36 AM  ?CBC  ?WBC 4.0 - 10.5 K/uL 4.8   6.1  5.8    ?Hemoglobin 13.0 - 17.0 g/dL 16.111.9   09.610.5   8.3    ?Hematocrit 39.0 - 52.0 % 35.2   31.7   25.6    ?Platelets 150 - 400 K/uL 115   402   246    ? ? ? ? ? ?Assessment and Plan: ?Mr. Charles Schmitt is a pleasant 66 y.o. male with acute subdural hematoma as well as subarachnoid hemorrhage, consistent with traumatic origin.  He has mild brain compression, but is currently GCS15. ? 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 ? ?Charles Schmitt K. Myer HaffYarbrough MD, MPHS ?Dept. of Neurosurgery ?  ?Level 5 qualifier - patient unable to provide full history due to intoxication ? ?

## 2021-05-04 ENCOUNTER — Encounter: Payer: Self-pay | Admitting: Internal Medicine

## 2021-05-04 LAB — MAGNESIUM
Magnesium: 2.6 mg/dL — ABNORMAL HIGH (ref 1.7–2.4)
Magnesium: 2.2 mg/dL (ref 1.7–2.4)

## 2021-05-04 LAB — URINALYSIS, ROUTINE W REFLEX MICROSCOPIC
Bilirubin Urine: NEGATIVE
Glucose, UA: NEGATIVE mg/dL
Hgb urine dipstick: NEGATIVE
Ketones, ur: 20 mg/dL — AB
Leukocytes,Ua: NEGATIVE
Nitrite: NEGATIVE
Protein, ur: NEGATIVE mg/dL
Specific Gravity, Urine: 1.013 (ref 1.005–1.030)
pH: 6 (ref 5.0–8.0)

## 2021-05-04 LAB — URINE DRUG SCREEN, QUALITATIVE (ARMC ONLY)
Amphetamines, Ur Screen: NOT DETECTED
Barbiturates, Ur Screen: NOT DETECTED
Benzodiazepine, Ur Scrn: NOT DETECTED
Cannabinoid 50 Ng, Ur ~~LOC~~: NOT DETECTED
Cocaine Metabolite,Ur ~~LOC~~: NOT DETECTED
MDMA (Ecstasy)Ur Screen: NOT DETECTED
Methadone Scn, Ur: NOT DETECTED
Opiate, Ur Screen: NOT DETECTED
Phencyclidine (PCP) Ur S: NOT DETECTED
Tricyclic, Ur Screen: NOT DETECTED

## 2021-05-04 LAB — GLUCOSE, CAPILLARY
Glucose-Capillary: 62 mg/dL — ABNORMAL LOW (ref 70–99)
Glucose-Capillary: 110 mg/dL — ABNORMAL HIGH (ref 70–99)
Glucose-Capillary: 100 mg/dL — ABNORMAL HIGH (ref 70–99)
Glucose-Capillary: 176 mg/dL — ABNORMAL HIGH (ref 70–99)
Glucose-Capillary: 229 mg/dL — ABNORMAL HIGH (ref 70–99)

## 2021-05-04 LAB — CBC
HCT: 28.6 % — ABNORMAL LOW (ref 39.0–52.0)
Hemoglobin: 9.8 g/dL — ABNORMAL LOW (ref 13.0–17.0)
MCH: 33.7 pg (ref 26.0–34.0)
MCHC: 34.3 g/dL (ref 30.0–36.0)
MCV: 98.3 fL (ref 80.0–100.0)
Platelets: 106 10*3/uL — ABNORMAL LOW (ref 150–400)
RBC: 2.91 MIL/uL — ABNORMAL LOW (ref 4.22–5.81)
RDW: 13.6 % (ref 11.5–15.5)
WBC: 4.1 10*3/uL (ref 4.0–10.5)
nRBC: 0.5 % — ABNORMAL HIGH (ref 0.0–0.2)

## 2021-05-04 LAB — COMPREHENSIVE METABOLIC PANEL
ALT: 22 U/L (ref 0–44)
AST: 44 U/L — ABNORMAL HIGH (ref 15–41)
Albumin: 3.1 g/dL — ABNORMAL LOW (ref 3.5–5.0)
Alkaline Phosphatase: 110 U/L (ref 38–126)
Anion gap: 14 (ref 5–15)
BUN: 9 mg/dL (ref 8–23)
CO2: 26 mmol/L (ref 22–32)
Calcium: 8.4 mg/dL — ABNORMAL LOW (ref 8.9–10.3)
Chloride: 102 mmol/L (ref 98–111)
Creatinine, Ser: 0.73 mg/dL (ref 0.61–1.24)
GFR, Estimated: >60 mL/min (ref >60)
Glucose, Bld: 62 mg/dL — ABNORMAL LOW (ref 70–99)
Potassium: 3.8 mmol/L (ref 3.5–5.1)
Sodium: 142 mmol/L (ref 135–145)
Total Bilirubin: 1.2 mg/dL (ref 0.3–1.2)
Total Protein: 6.5 g/dL (ref 6.5–8.1)

## 2021-05-04 LAB — BASIC METABOLIC PANEL
Anion gap: 11 (ref 5–15)
BUN: 9 mg/dL (ref 8–23)
CO2: 28 mmol/L (ref 22–32)
Calcium: 8.5 mg/dL — ABNORMAL LOW (ref 8.9–10.3)
Chloride: 98 mmol/L (ref 98–111)
Creatinine, Ser: 0.84 mg/dL (ref 0.61–1.24)
GFR, Estimated: >60 mL/min (ref >60)
Glucose, Bld: 108 mg/dL — ABNORMAL HIGH (ref 70–99)
Potassium: 4 mmol/L (ref 3.5–5.1)
Sodium: 137 mmol/L (ref 135–145)

## 2021-05-04 LAB — VITAMIN B12: Vitamin B-12: 296 pg/mL (ref 180–914)

## 2021-05-04 LAB — PROTIME-INR
INR: 1.1 (ref 0.8–1.2)
Prothrombin Time: 14.1 seconds (ref 11.4–15.2)

## 2021-05-04 LAB — PHOSPHORUS
Phosphorus: 3.4 mg/dL (ref 2.5–4.6)
Phosphorus: 2.7 mg/dL (ref 2.5–4.6)

## 2021-05-04 LAB — FOLATE: Folate: 35 ng/mL (ref >5.9)

## 2021-05-04 MED ORDER — CHLORDIAZEPOXIDE HCL 25 MG PO CAPS
25.0000 mg | ORAL_CAPSULE | Freq: Every day | ORAL | Status: AC
  Administered 2021-05-10 – 2021-05-11 (×2): 25 mg via ORAL
  Filled 2021-05-04 (×2): qty 1

## 2021-05-04 MED ORDER — LACTATED RINGERS IV BOLUS
500.0000 mL | Freq: Once | INTRAVENOUS | Status: AC
  Administered 2021-05-04: 500 mL via INTRAVENOUS

## 2021-05-04 MED ORDER — CHLORDIAZEPOXIDE HCL 25 MG PO CAPS
25.0000 mg | ORAL_CAPSULE | Freq: Three times a day (TID) | ORAL | Status: AC
  Administered 2021-05-06 – 2021-05-07 (×6): 25 mg via ORAL
  Filled 2021-05-04 (×6): qty 1

## 2021-05-04 MED ORDER — DEXTROSE 50 % IV SOLN
INTRAVENOUS | Status: AC
  Filled 2021-05-04: qty 50

## 2021-05-04 MED ORDER — LIDOCAINE 5 % EX PTCH
1.0000 | MEDICATED_PATCH | CUTANEOUS | Status: DC
  Administered 2021-05-04 – 2021-05-18 (×13): 1 via TRANSDERMAL
  Filled 2021-05-04 (×17): qty 1

## 2021-05-04 MED ORDER — DEXTROSE 50 % IV SOLN
12.5000 g | Freq: Once | INTRAVENOUS | Status: AC
  Administered 2021-05-04: 12.5 g via INTRAVENOUS

## 2021-05-04 MED ORDER — CHLORDIAZEPOXIDE HCL 25 MG PO CAPS
25.0000 mg | ORAL_CAPSULE | Freq: Four times a day (QID) | ORAL | Status: AC
Start: 2021-05-04 — End: 2021-05-06
  Administered 2021-05-04 – 2021-05-05 (×7): 25 mg via ORAL
  Filled 2021-05-04 (×7): qty 1

## 2021-05-04 MED ORDER — CHLORDIAZEPOXIDE HCL 25 MG PO CAPS
25.0000 mg | ORAL_CAPSULE | ORAL | Status: AC
  Administered 2021-05-08 – 2021-05-09 (×4): 25 mg via ORAL
  Filled 2021-05-04 (×4): qty 1

## 2021-05-04 MED ORDER — CHLORHEXIDINE GLUCONATE CLOTH 2 % EX PADS
6.0000 | MEDICATED_PAD | Freq: Every day | CUTANEOUS | Status: DC
  Administered 2021-05-06 – 2021-05-09 (×5): 6 via TOPICAL

## 2021-05-04 MED ORDER — METOPROLOL SUCCINATE ER 25 MG PO TB24
12.5000 mg | ORAL_TABLET | Freq: Every day | ORAL | Status: DC
  Administered 2021-05-04 – 2021-05-12 (×9): 12.5 mg via ORAL
  Filled 2021-05-04: qty 1
  Filled 2021-05-04 (×5): qty 0.5
  Filled 2021-05-04: qty 1
  Filled 2021-05-04: qty 0.5
  Filled 2021-05-04: qty 1

## 2021-05-04 MED ORDER — LACTATED RINGERS IV SOLN: INTRAVENOUS | Status: AC

## 2021-05-04 NOTE — Progress Notes (Incomplete)
Patient up to bedside commode multiple times with RN, NT, and RN/ NT combo. Patient had difficulty moving his left leg, originally just during transfer but eventually even when doing bed exercises. Attempted to educate patient on using the walker to help but patient would not use it. Dr. Myer Haff and Dr. Arlean Hopping aware.  ?

## 2021-05-04 NOTE — Progress Notes (Signed)
SLP Cancellation Note ? ?Patient Details ?Name: Charles Schmitt. ?MRN: [id number] ?DOB: September 05, 1955 ? ? ?Cancelled treatment:       Reason Eval/Treat Not Completed: Patient not medically ready.  ? ?SLP consult received and appreciated. Chart review completed. Noted SLP consult dated/timed for 3/27 @ 2256. Contacted RN and made RN aware. Also, noted no current SLP consult for swallowing evaluation. Per RN and chart review, pt NPO pending Neurosurgery Consult. RN stated she will notify provider to place order for swallowing evaluation and/or adjust timing Cognitive/Language as appropriate.  ? ?Clyde Canterbury, M.S., CCC-SLP ?Speech-Language Pathologist ?[facility] ?[phone]) ? ?Woodroe Chen ?05/04/2021, 8:23 AM ?

## 2021-05-04 NOTE — Consult Note (Signed)
PHARMACY CONSULT NOTE - FOLLOW UP ? ?Pharmacy Consult for Electrolyte Monitoring and Replacement  ? ?Recent Labs: ?Potassium (mmol/L)  ?Date Value  ?05/04/2021 4.0  ? ?Magnesium (mg/dL)  ?Date Value  ?05/04/2021 2.2  ? ?Calcium (mg/dL)  ?Date Value  ?05/04/2021 8.5 (L)  ? ?Albumin (g/dL)  ?Date Value  ?05/04/2021 3.1 (L)  ?03/03/2021 3.0 (L)  ? ?Phosphorus (mg/dL)  ?Date Value  ?05/04/2021 2.7  ? ?Sodium (mmol/L)  ?Date Value  ?05/04/2021 137  ?03/03/2021 145 (H)  ? ?Corrected Calcium: 9.3 mg/dL ? ?Assessment: ?66yo M presents to ED with complaint of increased falls last couple of months, most recent being two days ago. Patient also reports increased alcohol intake, most recent drink being a few hours ago. Pharmacy consulted for electrolyte monitoring and replacement.  ? ?Goal of Therapy:  ?Electrolytes WNL ? ?Plan:  ?No supplementation currently needed, labs WNL  ?Will recheck electrolytes with AM labs ? ?Bettey Costa ,PharmD ?Clinical Pharmacist ?05/04/2021 1:01 PM  ?

## 2021-05-04 NOTE — Progress Notes (Signed)
? ?   Attending Progress Note ? ?History: Charles Tuman. is here for subdural hematoma and alcohol abuse ? ?HD2: No complaints ? ?History of Present Illness: ?05/03/2021 ?Charles Sanker. is a 66 y.o. male who presents with the chief complaint of multiple falls due to alcohol intoxication.  EMS was called due to a change in status.  He was not very mobile in ER, and had multiple lacerations on his face and head of different ages, so HCT was performed.  This showed an acute SDH. ?  ?The patient has since sobered, though he is not completely sober, and is able to answer questions.  He has no HA, N, or V.  He denies weakness.  He cannot provide further history. ?  ?He reports he had a couple of shots of vodka today.  His family reports his alcohol intake is significant, potentially a half gallon a day. ?  ? ?Physical Exam: ?Vitals:  ? 05/04/21 0812 05/04/21 0900  ?BP:  (!) 139/91  ?Pulse: 88 82  ?Resp: 16 17  ?Temp:    ?SpO2: 100% 98%  ? ? ?AA Ox3 ?CNI ?No drift ? ?Strength:5/5 throughout BUE and BLE ? ?Data: ? ?Recent Labs  ?Lab 05/03/21 ?1735 05/04/21 ?0506  ?NA 139 142  ?K 2.8* 3.8  ?CL 98 102  ?CO2 25 26  ?BUN 8 9  ?CREATININE 0.80 0.73  ?GLUCOSE 86 62*  ?CALCIUM 8.5* 8.4*  ? ?Recent Labs  ?Lab 05/04/21 ?0506  ?AST 44*  ?ALT 22  ?ALKPHOS 110  ?  ? Recent Labs  ?Lab 05/03/21 ?1937 05/04/21 ?0506  ?WBC 4.8 4.1  ?HGB 11.9* 9.8*  ?HCT 35.2* 28.6*  ?PLT 115* 106*  ? ?Recent Labs  ?Lab 05/03/21 ?1937 05/04/21 ?0724  ?INR 1.0 1.1  ?  ?   ? ? ?Other tests/results: Repeat Hct overnight stable ? ?Assessment/Plan: ? ?Charles Pace. Is stable from acute R subdural hematoma ? ?- Continue to monitor for DT's and alcohol withdrawal per ICU team ?- Q2 hour neuro checks until 8 pm this evening, then switch to q4 hr neuro checks ?- DVT prophylaxis ok to start tomorrow ?- PTOT to start tomorrow after stability of subdural is confirmed by stable neurologic exam ? ? ?Venetia Night MD, MPHS ?Department of  Neurosurgery ? ? ? ?

## 2021-05-04 NOTE — TOC Initial Note (Signed)
Transition of Care (TOC) - Initial/Assessment Note  ? ? ?Patient Details  ?Name: Charles Schmitt. ?MRN: [id number] ?Date of Birth: [birth year] ? ?Transition of Care (TOC) CM/SW Contact:    ?Kristiane Morsch L Maveryk Renstrom, LCSWA ?Phone Number: ?05/04/2021, 11:34 AM ? ?Clinical Narrative:                 ? ?Patient presenting to [facility] due to fall associated with increased ETOH intake. TOC consult for substance use. CSW spoke to patient at bedside and explained outpatient and inpatient substance use resources. CSW offered resources and patient accepted.  ? ?No further TOC needs. ?  ?  ? ? ?Patient Goals and CMS Choice ?  ?  ?  ? ?Expected Discharge Plan and Services ?  ?  ?  ?  ?  ?                ?  ?  ?  ?  ?  ?  ?  ?  ?  ?  ? ?Prior Living Arrangements/Services ?  ?  ?  ?       ?  ?  ?  ?  ? ?Activities of Daily Living ?Home Assistive Devices/Equipment: Dan Humphreys (specify type) ?ADL Screening (condition at time of admission) ?Patient's cognitive ability adequate to safely complete daily activities?: Yes ?Is the patient deaf or have difficulty hearing?: No ?Does the patient have difficulty seeing, even when wearing glasses/contacts?: No ?Does the patient have difficulty concentrating, remembering, or making decisions?: No ?Patient able to express need for assistance with ADLs?: Yes ?Does the patient have difficulty dressing or bathing?: No ?Independently performs ADLs?: No ?Communication: Independent ?Dressing (OT): Independent ?Grooming: Independent ?Feeding: Independent ?Bathing: Independent ?Toileting: Independent ?In/Out Bed: Independent ?Walks in Home: Independent ? ?Permission Sought/Granted ?  ?  ?   ?   ?   ?   ? ?Emotional Assessment ?  ?  ?  ?  ?  ?  ? ?Admission diagnosis:  Subarachnoid hemorrhage (HCC) [I60.9] ?Hypokalemia [E87.6] ?Subdural hematoma [S06.5XAA] ?SAH (subarachnoid hemorrhage) (HCC) [I60.9] ?Intraparenchymal hemorrhage of brain (HCC) [I61.9] ?Injury of head, initial encounter [S09.90XA] ?Patient Active Problem  List  ? Diagnosis Date Noted  ? SAH (subarachnoid hemorrhage) (HCC) 05/03/2021  ? Subdural hematoma   ? Protein-calorie malnutrition, severe 02/10/2021  ? Underweight 02/08/2021  ? Cardiomyopathy (HCC)   ? Pressure injury of skin 02/05/2021  ? Sinus tachycardia 02/05/2021  ? Bone lesion 02/05/2021  ? Chronic systolic heart failure (HCC) 02/05/2021  ? Hypokalemia 02/05/2021  ? Hyperglycemia 02/05/2021  ? Alcohol use 02/05/2021  ? Multifocal pneumonia   ? Anemia   ? Essential hypertension   ? ?PCP:  Pcp, No ?Pharmacy:   ?[facility] ?[address], [address] ?[place] [address] ?Phone: [phone] Fax: [phone] ? ?[id number] - [place], [place] - [address] ?[address] ?[place] [address] ?Phone: [phone] Fax: [phone] ? ?[id number] - [place], [place] - [address] [place] [address] ?[address] [place] [address] ?[place] [address] ?Phone: [phone] Fax: [phone] ? ? ? ? ?Social Determinants of Health (SDOH) Interventions ?  ? ?Readmission Risk Interventions ?   ? View : No data to display.  ?  ?  ?  ? ? ? ?

## 2021-05-05 DIAGNOSIS — I619 Nontraumatic intracerebral hemorrhage, unspecified: Secondary | ICD-10-CM

## 2021-05-05 DIAGNOSIS — S0990XA Unspecified injury of head, initial encounter: Secondary | ICD-10-CM

## 2021-05-05 DIAGNOSIS — Z515 Encounter for palliative care: Secondary | ICD-10-CM

## 2021-05-05 DIAGNOSIS — Z789 Other specified health status: Secondary | ICD-10-CM

## 2021-05-05 LAB — BASIC METABOLIC PANEL
Anion gap: 10 (ref 5–15)
BUN: 9 mg/dL (ref 8–23)
CO2: 31 mmol/L (ref 22–32)
Calcium: 8.7 mg/dL — ABNORMAL LOW (ref 8.9–10.3)
Chloride: 101 mmol/L (ref 98–111)
Creatinine, Ser: 0.92 mg/dL (ref 0.61–1.24)
GFR, Estimated: >60 mL/min (ref >60)
Glucose, Bld: 146 mg/dL — ABNORMAL HIGH (ref 70–99)
Potassium: 4.2 mmol/L (ref 3.5–5.1)
Sodium: 142 mmol/L (ref 135–145)

## 2021-05-05 LAB — CBC
HCT: 30.8 % — ABNORMAL LOW (ref 39.0–52.0)
Hemoglobin: 10.4 g/dL — ABNORMAL LOW (ref 13.0–17.0)
MCH: 33.1 pg (ref 26.0–34.0)
MCHC: 33.8 g/dL (ref 30.0–36.0)
MCV: 98.1 fL (ref 80.0–100.0)
Platelets: 102 10*3/uL — ABNORMAL LOW (ref 150–400)
RBC: 3.14 MIL/uL — ABNORMAL LOW (ref 4.22–5.81)
RDW: 13.3 % (ref 11.5–15.5)
WBC: 2 10*3/uL — ABNORMAL LOW (ref 4.0–10.5)
nRBC: 0 % (ref 0.0–0.2)

## 2021-05-05 LAB — GLUCOSE, CAPILLARY
Glucose-Capillary: 78 mg/dL (ref 70–99)
Glucose-Capillary: 135 mg/dL — ABNORMAL HIGH (ref 70–99)
Glucose-Capillary: 120 mg/dL — ABNORMAL HIGH (ref 70–99)
Glucose-Capillary: 152 mg/dL — ABNORMAL HIGH (ref 70–99)
Glucose-Capillary: 153 mg/dL — ABNORMAL HIGH (ref 70–99)
Glucose-Capillary: 117 mg/dL — ABNORMAL HIGH (ref 70–99)
Glucose-Capillary: 108 mg/dL — ABNORMAL HIGH (ref 70–99)

## 2021-05-05 LAB — PHOSPHORUS: Phosphorus: 3.1 mg/dL (ref 2.5–4.6)

## 2021-05-05 LAB — MAGNESIUM: Magnesium: 1.8 mg/dL (ref 1.7–2.4)

## 2021-05-05 MED ORDER — MAGNESIUM SULFATE 2 GM/50ML IV SOLN
2.0000 g | Freq: Once | INTRAVENOUS | Status: AC
  Administered 2021-05-05: 2 g via INTRAVENOUS
  Filled 2021-05-05: qty 50

## 2021-05-05 MED ORDER — ENSURE ENLIVE PO LIQD
237.0000 mL | Freq: Three times a day (TID) | ORAL | Status: DC
  Administered 2021-05-07 – 2021-05-12 (×8): 237 mL via ORAL

## 2021-05-05 NOTE — Evaluation (Signed)
Occupational Therapy Evaluation ?Patient Details ?Name: Charles PaceFrank Tung Jr. ?MRN: [id number] ?DOB: May 17, 1955 ?Today's Date: 05/05/2021 ? ? ?History of Present Illness [name] [facility] from home via EMS after heavy drinking these past 2 weeks and multiple falls at home. The patient is unsure why he needed to come to the hospital, but does admit he fell out of bed and had difficulty getting up this morning because of weakness in his legs. Several CT scans showing traumatic SDH, SAH, and midline shift.  ? ?Clinical Impression ?  ?Patient presenting with decreased Ind in self care, functional mobility/transfers, endurance, and safety awareness. Patient reports living with significant other and mention of son. 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 Recent use of RW in the last several days and states, "I only fall when I drink".  No family present to confirm baseline. Pt needing +2 assistance for bed mobility and standing attempts with RW. He was unable to safely take steps and abruptly returns to bed with each stand. Pt is currently [place] VI. Patient will benefit from acute OT to increase overall independence in the areas of ADLs, functional mobility, and safety awareness in order to safely discharge to next venue of care.  ?   ? ?Recommendations for follow up therapy are one component of a multi-disciplinary discharge planning process, led by the attending physician.  Recommendations may be updated based on patient status, additional functional criteria and insurance authorization.  ? ?Follow Up Recommendations ? Skilled nursing-short term rehab (<3 hours/day)  ?  ?Assistance Recommended at Discharge Frequent or constant Supervision/Assistance  ?Patient can return home with the following Two people to help with walking and/or transfers;Two people to help with  bathing/dressing/bathroom;Assistance with cooking/housework;Help with stairs or ramp for entrance;Assist for transportation;Direct supervision/assist for medications management;Direct supervision/assist for financial management ? ?  ?Functional Status Assessment ? Patient has had a recent decline in their functional status and demonstrates the ability to make significant improvements in function in a reasonable and predictable amount of time.  ?Equipment Recommendations ? Other (comment) (defer to next venue of care)  ?  ?Recommendations for Other Services   ? ? ?  ?Precautions / Restrictions Precautions ?Precautions: Fall  ? ?  ? ?Mobility Bed Mobility ?Overal bed mobility: Needs Assistance ?Bed Mobility: Supine to Sit, Sit to Supine ?  ?  ?Supine to sit: +2 for safety/equipment, Mod assist ?Sit to supine: +2 for safety/equipment, Mod assist ?  ?  ?  ? ?Transfers ?Overall transfer level: Needs assistance ?Equipment used: Rolling walker (2 wheels) ?Transfers: Sit to/from Stand ?Sit to Stand: Mod assist, +2 safety/equipment, +2 physical assistance, Min assist ?  ?  ?  ?  ?  ?  ?  ? ?  ?Balance Overall balance assessment: Needs assistance ?Sitting-balance support: Feet supported, Single extremity supported ?Sitting balance-Leahy Scale: Fair ?  ?  ?Standing balance support: Reliant on assistive device for balance, Bilateral upper extremity supported ?Standing balance-Leahy Scale: Zero ?Standing balance comment: +2 assist for safety ?  ?  ?  ?  ?  ?  ?  ?  ?  ?  ?  ?   ? ?ADL either performed or assessed with clinical judgement  ? ?ADL Overall ADL's : Needs assistance/impaired ?  ?  ?  ?  ?  ?  ?  ?  ?  ?  ?  ?  ?  ?  ?  ?  ?  ?  ?  ?  General ADL Comments: static sitting balance with close supervision. UB self care at set up A and LB self care likely mod - max A. +2 assist to attempt stand with pt unable to take steps safely this session.  ? ? ? ?Vision Patient Visual Report: No change from baseline ?   ?   ?   ?    ? ?Pertinent Vitals/Pain Pain Assessment ?Pain Assessment: Faces ?Faces Pain Scale: No hurt  ? ? ? ?Hand Dominance Right ?  ?Extremity/Trunk Assessment Upper Extremity Assessment ?Upper Extremity Assessment: Generalized weakness;LUE deficits/detail ?LUE Deficits / Details: decreased dexterity, speed, and coordination but overall appears functional with ROM and strength ?  ?Lower Extremity Assessment ?Lower Extremity Assessment: Defer to PT evaluation ?  ?  ?  ?Communication Communication ?Communication: No difficulties ?  ?Cognition Arousal/Alertness: Awake/alert ?Behavior During Therapy: Impulsive, Flat affect ?Overall Cognitive Status: No family/caregiver present to determine baseline cognitive functioning ?Area of Impairment: Rancho level ?  ?  ?  ?  ?  ?  ?  ?Rancho Levels of Cognitive Functioning ?Rancho Mirant Scales of Cognitive Functioning: Confused/appropriate ?  ?  ?  ?  ?  ?  ?  ?  ?Rancho Mirant Scales of Cognitive Functioning: Confused/appropriate ?  ?   ?   ?   ? ? ?Home Living Family/patient expects to be discharged to:: Private residence ?Living Arrangements: Spouse/significant other ?Available Help at Discharge: Family;Available PRN/intermittently ?Type of Home: House ?Home Access: Stairs to enter ?Entrance Stairs-Number of Steps: 5 ?Entrance Stairs-Rails: Right;Left;Can reach both ?Home Layout: One level ?  ?  ?Bathroom Shower/Tub: Tub/shower unit ?  ?  ?  ?  ?  ?  ?Additional Comments: Pt reports girlfriend and son live with him. 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 ?  ? ?  ?Prior Functioning/Environment Prior Level of Function : Independent/Modified Independent;Driving ?  ?  ?  ?  ?  ?  ?Mobility Comments: pt reports he drives and was only recently using RW. He endorses, " I only fall when I drink". ?ADLs Comments: Pt reports being independent in self care and assisting with som IADLs such as laundry at baseline. ?  ? ?   ?  ?OT Problem List: Decreased strength;Decreased activity tolerance;Impaired balance (sitting and/or standing);Decreased safety awareness;Decreased knowledge of use of DME or AE;Decreased knowledge of precautions;Decreased range of motion;Decreased cognition;Decreased coordination ?  ?   ?OT Treatment/Interventions: Self-care/ADL training;Therapeutic exercise;Therapeutic activities;Cognitive remediation/compensation;Neuromuscular education;Energy conservation;DME and/or AE instruction;Patient/family education;Manual therapy;Balance training  ?  ?OT Goals(Current goals can be found in the care plan section) Acute Rehab OT Goals ?Patient Stated Goal: go home and feed the horses ?OT Goal Formulation: With patient ?Time For Goal Achievement: 05/19/21 ?Potential to Achieve Goals: Good ?ADL Goals ?Pt Will Perform Grooming: with min assist;standing ?Pt Will Perform Lower Body Dressing: with min assist;sit to/from stand ?Pt Will Transfer to Toilet: with min assist;ambulating ?Pt Will Perform Toileting - Clothing Manipulation and hygiene: with min assist;sit to/from stand  ?OT Frequency: Min 3X/week ?  ? ?Co-evaluation PT/OT/SLP Co-Evaluation/Treatment: Yes ?Reason for Co-Treatment: Complexity of the patient's impairments (multi-system involvement);Necessary to address cognition/behavior during functional activity;For patient/therapist safety;To address functional/ADL transfers ?PT goals addressed during session: Mobility/safety with mobility;Balance ?OT goals addressed during session: ADL's and self-care;Proper use of Adaptive equipment and DME ?  ? ?  ?AM-PAC OT "6 Clicks" Daily Activity     ?Outcome Measure Help  from another person eating meals?: A Little ?Help from another person taking care of personal grooming?: A Little ?Help from another person toileting, which includes using toliet, bedpan, or urinal?: Total ?Help from another person bathing (including washing, rinsing, drying)?: Total ?Help from another person to  put on and taking off regular upper body clothing?: A Little ?Help from another person to put on and taking off regular lower body clothing?: Total ?6 Click Score: 12 ?  ?End of Session Equipment Utilized During Treatment:

## 2021-05-05 NOTE — Progress Notes (Signed)
SLP Cancellation Note ? ?Patient Details ?Name: Charles Schmitt. ?MRN: [id number] ?DOB: [birth year] ? ? ?Cancelled treatment:       Reason Eval/Treat Not Completed: Patient not medically ready ? ?Noted SLP consult dated/timed for 3/27 @ 2256. Contacted RN and made RN aware. Per RN, pt more drowsy today and likely not appropriate for Cognitive/Language Evaluation this date. Noted no consult placed for Clinical Swallowing Evaluation. RN also noted pt tolerating medication and regular diet, and pt no current SLP needs for dysphagia.  ? ?Will plan for Cognitive/Language Evaluation next date as appropriate.  ? ?Clyde Canterbury, M.S., CCC-SLP ?Speech-Language Pathologist ?[place] [facility] ?[phone])  ? ?Charles Schmitt ?05/05/2021, 11:32 AM ?

## 2021-05-05 NOTE — Progress Notes (Signed)
Initial Nutrition Assessment ? ?DOCUMENTATION CODES:  ? ?Underweight, Severe malnutrition in context of chronic illness ? ?INTERVENTION:  ? ?-Ensure Enlive po TID, each supplement provides 350 kcal and 20 grams of protein ?-MVI with minerals daily ? ?NUTRITION DIAGNOSIS:  ? ?Severe Malnutrition related to chronic illness (ETOH abuse) as evidenced by moderate fat depletion, severe fat depletion, moderate muscle depletion, severe muscle depletion. ? ?GOAL:  ? ?Patient will meet greater than or equal to 90% of their needs ? ?MONITOR:  ? ?PO intake, Supplement acceptance, Labs, Weight trends, Skin, I & O's ? ?REASON FOR ASSESSMENT:  ? ?Consult ?Assessment of nutrition requirement/status ? ?ASSESSMENT:  ? ?Charles Schmitt is here with traumatic SAH/SDH/IPH after fall from standing height in the setting of acute alcohol intoxication. CT head on admission revealed 4 mm right to left midline shift. Repeat CT head showed stable findings. He is a GCS of 15. 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 ? ?Pt admitted with traumatic SAH/SHD/IPH secondary to fall from alcohol intoxication.  ? ?Reviewed I/O's: -221 ml x 24 hours and +472 ml since admission ? ?UOP: 1.6 L x 24 hours ? ?Case discussed with RN, who reports pt is more alert and calm in comparison to this weekend.  Pt able to tolerate food, liquids, and medications without difficulty, but prioritizes drinking above eating. RN reports pt with heavy ETOH abuse; family members report at least a half gallon of alcohol consumption every 2-3 days.  ? ?Pt drowsy at time of visit, but became more alert and conversant as visit progressed. Pt reports he feels tired from being woken up to check vitals this morning. Pt shares that his appetite is fair and that he ate a cheeseburger and fries last night without difficulty. Pt endorses decreased oral intake and weight loss  PTA. When asked why, pt reports "because I was drinking too much". Pt estimates he consumes a half gallon of liquor weekly, which he mixed with orange juice. Pt reports he eats very little when he drinks, but is unable to provide a diet recall. Assisted pt with setting up breakfast tray and cutting his food. Also provided pt with jello per his request.  ? ?Pt reports his UBW is around 250# and estimates he has lost about 40 pounds over "the past couple of months". Reviewed wt hx; pt has experienced a 2.3% wt loss over the past 2 months, which is not significant for time frame.  ? ?Discussed importance of good meal and supplement intake to promote healing. Pt would greatly benefit from addition of oral nutrition supplements.  ? ?Medications reviewed and include folic acid and thiamine.  ? ?Labs reviewed: CBGS: 120-229 (inpatient orders for glycemic control are none).   ? ?NUTRITION - FOCUSED PHYSICAL EXAM: ? ?Flowsheet Row Most Recent Value  ?Orbital Region Moderate depletion  ?Upper Arm Region Severe depletion  ?Thoracic and Lumbar Region Moderate depletion  ?Buccal Region Severe depletion  ?Temple Region Severe depletion  ?Clavicle Bone Region Severe depletion  ?Clavicle and Acromion Bone Region Severe depletion  ?Scapular Bone Region Severe depletion  ?Dorsal Hand Moderate depletion  ?Patellar Region Moderate depletion  ?Anterior Thigh Region Moderate depletion  ?Posterior Calf Region Moderate depletion  ?Edema (RD Assessment) None  ?Hair Reviewed  ?Eyes Reviewed  ?Mouth Reviewed  ?Skin Reviewed  ?Nails Reviewed  ? ?  ? ? ?Diet Order:   ?Diet Order   ? ?       ?  Diet regular Room service appropriate? Yes; Fluid consistency: Thin  Diet effective now       ?  ? ?  ?  ? ?  ? ? ?EDUCATION NEEDS:  ? ?Education needs have been addressed ? ?Skin:  Skin Assessment: Skin Integrity Issues: ?Skin Integrity Issues:: Other (Comment) ?Other: healed pressure injury to sacrum ? ?Last BM:  05/02/21 ? ?Height:  ? ?Ht Readings from  Last 1 Encounters:  ?05/03/21 6\' 5"  (1.956 m)  ? ? ?Weight:  ? ?Wt Readings from Last 1 Encounters:  ?05/05/21 62.5 kg  ? ? ?Ideal Body Weight:  94.5 kg ? ?BMI:  Body mass index is 16.34 kg/m?. ? ?Estimated Nutritional Needs:  ? ?Kcal:  2300-2500 ? ?Protein:  125-150 grams ? ?Fluid:  > 2 L ? ? ? ?05/07/21, RD, LDN, CDCES ?Registered Dietitian II ?Certified Diabetes Care and Education Specialist ?Please refer to [facility] for RD and/or RD on-call/weekend/after hours pager  ?

## 2021-05-05 NOTE — Progress Notes (Signed)
?PROGRESS NOTE ? ? ? ?Charles Schmitt.  EGB:[id number] DOB: Jul 10, 1955 DOA: 05/03/2021 ?PCP: Pcp, No  ? ? ?Brief Narrative:  ?[name] presenting to [facility] ED from home via EMS after heavy drinking these past 2 weeks and multiple falls at home. The patient is unsure why he needed to come to the hospital, but does admit he fell out of bed and had difficulty getting up this morning because of weakness in his legs. ?  ?In talking to his significant other, Corrie Dandy, who he lives with via the telephone- she explained that he had fallen out of bed twice last night and she had difficulty getting him back into bed because he was unable to help her. She reports that he had been "cutting back" on his alcohol consumption, drinking "a beer occasionally" for about 2 weeks. Then 2 weeks ago, 04/14/21?, he reverted to drinking "heavily". The patient describes drinking a 1/2 gallon of vodka every 3 days and Corrie Dandy, who he lives with confirmed that sounded right. Of note, in talking to Georjean Mode", the patient's daughter in law, she reports that his normal drinking habits are equal to a 1/2 gallon of vodka daily. Neither relative is sure why the change but that over the past 2 weeks he has been incontinent, confused, laying around the house drinking. Corrie Dandy reports that when he cut back she didn't notice any severe withdrawal symptoms but reports that he has been vomiting and not eating any solid food for the past 3 weeks or so. ? ?Patient admitted directly to the ICU service.  Neurosurgery consulted.  Serial CTs show stability of bleed with 3 mm midline shift.  No neurosurgical intervention warranted at this time.  Per neurosurgery cleared for initiation of PT OT and DVT prophylaxis on 3/27. ? ?Patient lethargic on my exam.  Unable to provide history. ? ? ?Assessment & Plan: ?  ?Principal Problem: ?  SAH (subarachnoid hemorrhage) (HCC) ?Active Problems: ?  Subdural hematoma ? ?Acute traumatic subdural hematoma ?Bilateral subarachnoid  hemorrhage ?Intraparenchymal hemorrhagic contusion secondary to multiple falls ?Occurred in the setting of significant alcohol use ?Neurosurgery consulted ?Serial CT stable ?No plans for inpatient neurosurgical evaluation ?Plan: ?Neurochecks every 4 hours ?SBP goal less than 160 ?PT OT as able ?We will likely start DVT prophylaxis 3/28 ?SLP, PT, OT evaluations ?DC strict bedrest ?Continue fall and aspiration precautions ? ?Alcohol abuse ?High risk for alcohol withdrawal ?Tobacco use ?No clinical signs of acute withdrawal at this time ?Patient drinks as much of half gallon of vodka daily ?Plan: ?Continue Librium taper ?Attempt to avoid over oversedation ?Ativan IV as needed for severe symptoms/seizures ?TOC consult ?High-dose thiamine ?Folic acid and multivitamin daily ?Smoking cessation counseling ? ?Hypokalemia ?Hypoalbuminemia ?Suspected severe protein calorie malnutrition ?Replace electrolytes ?Daily BMP ?RD consultation ? ?Transaminitis ?Likely representative of mild alcoholic hepatitis ?No evidence of abdominal discomfort or ascites ?Plan: ?Trend hepatic function ?Avoid hepatotoxins ? 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 ? ?DVT prophylaxis: SCD ?Code Status: Full ?Family Communication: Attempted to call both Luna Kitchens and Corrie Dandy, patient contacts listed in chart.  No answer from either person.  No ability to leave voicemail ?Disposition Plan: Status is: Inpatient ?Remains inpatient appropriate because: Traumatic subdural.  High risk for alcohol withdrawal. ? ? ?Level of care: Stepdown ? ?Consultants:  ?Neurosurgery ? ?Procedures:  ?None ? ?Antimicrobials: ?None ? ? ?  Subjective: ?Seen and examined.  Lethargic.  Unable to provide history.  Does open eyes. ? ?Objective: ?Vitals:  ? 05/05/21 0500  05/05/21 0600 05/05/21 0800 05/05/21 1000  ?BP: 107/78 97/71 103/78 97/74  ?Pulse:   94 95  ?Resp: 16 17 16 18   ?Temp:   98.7 ?F (37.1 ?C)   ?TempSrc:   Oral   ?SpO2:   96% 97%  ?Weight:      ?Height:      ? ? ?Intake/Output Summary (Last 24 hours) at 05/05/2021 1353 ?Last data filed at 05/05/2021 1100 ?Gross per 24 hour  ?Intake 800 ml  ?Output 1650 ml  ?Net -850 ml  ? ?Filed Weights  ? 05/03/21 1733 05/03/21 2045 05/05/21 0423  ?Weight: 81.6 kg 57.6 kg 62.5 kg  ? ? ?Examination: ? ?General exam: Lethargic.  Appears chronically ill ?Respiratory system: Poor respiratory effort.  Lungs clear.  Normal work of breathing.  Room air ?Cardiovascular system: Tachycardic, regular rhythm, no murmurs, no pedal edema ?Gastrointestinal system: Soft, NT/ND, normal bowel sounds ?Central nervous system: Lethargic, oriented to person and place, no focal deficits ?Extremities: Diffusely decreased power bilaterally ?Skin: No rashes, lesions or ulcers ?Psychiatry: Judgement and insight appear impaired. Mood & affect flattened.  ? ? ? ?Data Reviewed: I have personally reviewed following labs and imaging studies ? ?CBC: ?Recent Labs  ?Lab 05/03/21 ?1937 05/04/21 ?05/06/21 05/05/21 ?05/07/21  ?WBC 4.8 4.1 2.0*  ?HGB 11.9* 9.8* 10.4*  ?HCT 35.2* 28.6* 30.8*  ?MCV 98.1 98.3 98.1  ?PLT 115* 106* 102*  ? ?Basic Metabolic Panel: ?Recent Labs  ?Lab 05/03/21 ?1735 05/03/21 ?1937 05/04/21 ?05/06/21 05/04/21 ?1210 05/05/21 ?05/07/21  ?NA 139  --  142 137 142  ?K 2.8*  --  3.8 4.0 4.2  ?CL 98  --  102 98 101  ?CO2 25  --  26 28 31   ?GLUCOSE 86  --  62* 108* 146*  ?BUN 8  --  9 9 9   ?CREATININE 0.80  --  0.73 0.84 0.92  ?CALCIUM 8.5*  --  8.4* 8.5* 8.7*  ?MG  --  1.9 2.6* 2.2 1.8  ?PHOS  --  3.5 3.4 2.7 3.1  ? ?GFR: ?Estimated Creatinine Clearance: 72.7 mL/min (by C-G formula based on SCr of 0.92 mg/dL). ?Liver Function Tests: ?Recent Labs  ?Lab 05/03/21 ?1937 05/04/21 ?0506  ?AST 47* 44*  ?ALT 24 22  ?ALKPHOS 127* 110  ?BILITOT 1.1 1.2  ?PROT 7.6 6.5  ?ALBUMIN 3.3*  3.1*  ? ?No results for input(s): LIPASE, AMYLASE in the last 168 hours. ?No results for input(s): AMMONIA in the last 168 hours. ?Coagulation Profile: ?Recent Labs  ?Lab 05/03/21 ?1937 05/04/21 ?0724  ?INR 1.0 1.1  ? ?Cardiac Enzymes: ?No results for input(s): CKTOTAL, CKMB, CKMBINDEX, TROPONINI in the last 168 hours. ?BNP (last 3 results) ?No results for input(s): PROBNP in the last 8760 hours. ?HbA1C: ?No results for input(s): HGBA1C in the last 72 hours. ?CBG: ?Recent Labs  ?Lab 05/04/21 ?1547 05/04/21 ?1936 05/05/21 ?0410 05/05/21 ?0800 05/05/21 ?1147  ?GLUCAP 176* 229* 135* 120* 152*  ? ?Lipid Profile: ?No results for input(s): CHOL, HDL, LDLCALC, TRIG, CHOLHDL, LDLDIRECT in the last 72 hours. ?Thyroid Function Tests: ?No results for input(s): TSH, T4TOTAL, FREET4, T3FREE, THYROIDAB in the last 72 hours. ?Anemia Panel: ?Recent Labs  ?  05/04/21 ?0724  ?VITAMINB12 296  ?FOLATE 35.0  ? ?Sepsis Labs: ?No results for input(s): PROCALCITON, LATICACIDVEN in the last 168 hours. ? ?Recent Results (from the past 240 hour(s))  ?  Resp Panel by RT-PCR (Flu A&B, Covid) Nasopharyngeal Swab     Status: None  ? Collection Time: 05/03/21  7:37 PM  ? Specimen: Nasopharyngeal Swab; Nasopharyngeal(NP) swabs in vial transport medium  ?Result Value Ref Range Status  ? SARS Coronavirus 2 by RT PCR NEGATIVE NEGATIVE Final  ?  Comment: (NOTE) ?SARS-CoV-2 target nucleic acids are NOT DETECTED. ? ?The SARS-CoV-2 RNA is generally detectable in upper respiratory ?specimens during the acute phase of infection. The lowest ?concentration of SARS-CoV-2 viral copies this assay can detect is ?138 copies/mL. A negative result does not preclude SARS-Cov-2 ?infection and should not be used as the sole basis for treatment or ?other patient management decisions. A negative result may occur with  ?improper specimen collection/handling, submission of specimen other ?than nasopharyngeal swab, presence of viral mutation(s) within the ?areas targeted by  this assay, and inadequate number of viral ?copies(<138 copies/mL). A negative result must be combined with ?clinical observations, patient history, and epidemiological ?information. The expected result is N

## 2021-05-05 NOTE — Evaluation (Signed)
Physical Therapy Evaluation ?Patient Details ?Name: Charles Schmitt. ?MRN: [id number] ?DOB: 1955/05/16 ?Today's Date: 05/05/2021 ? ?History of Present Illness ? [name] presenting to [facility] ED from home via EMS after heavy drinking these past 2 weeks and multiple falls at home. The patient is unsure why he needed to come to the hospital, but does admit he fell out of bed and had difficulty getting up this morning because of weakness in his legs. Several CT scans showing traumatic SDH, SAH, and midline shift.  ?Clinical Impression ? Pt is a pleasant 66 year old male who was admitted for acute subdural hematoma with midline shift and acute B SAH with R>L. Cleared to participate via neuro MD and attending MD. PT/OT co-eval performed. Per notes, pt lethargic, however very alert and appropriate throughout session. Pt performs bed mobility/transfers with +2 and mod assist using RW and unable to ambulate at this time. Increased HR to 131bpm with standing at bedside, appeared slightly anxious with mobility. Pt demonstrates deficits with cognition/safety awareness/strength/balance/mobility. Appears to have strength deficits in L>R. Slight coordination difficulty with RAMP in B UE with less L UE AROM in shoulder. Good participation and pt very motivated to work with therapist as he [place]. CUrrently at Rachos level VI. Would benefit from skilled PT to address above deficits and promote optimal return to PLOF. Currently recommending CIR at this time as pt currently off baseline level. ?   ? ?Recommendations for follow up therapy are one component of a multi-disciplinary discharge planning process, led by the attending physician.  Recommendations may be updated based on patient status, additional functional criteria and insurance authorization. ? ?Follow Up Recommendations Acute inpatient rehab (3hours/day) ? ?  ?Assistance Recommended at Discharge Frequent or constant Supervision/Assistance  ?Patient  can return home with the following ? Two people to help with walking and/or transfers;A lot of help with bathing/dressing/bathroom;Direct supervision/assist for medications management;Direct supervision/assist for financial management;Assist for transportation;Help with stairs or ramp for entrance ? ?  ?Equipment Recommendations  (TBD)  ?Recommendations for Other Services ?    ?  ?Functional Status Assessment Patient has had a recent decline in their functional status and demonstrates the ability to make significant improvements in function in a reasonable and predictable amount of time.  ? ?  ?Precautions / Restrictions Precautions ?Precautions: Fall ?Restrictions ?Weight Bearing Restrictions: No  ? ?  ? ?Mobility ? Bed Mobility ?Overal bed mobility: Needs Assistance ?Bed Mobility: Supine to Sit, Sit to Supine ?  ?  ?Supine to sit: +2 for safety/equipment, Mod assist ?Sit to supine: +2 for safety/equipment, Mod assist ?  ?General bed mobility comments: needs assist for sequencing. Once seated, BP taken, WNL. No symptoms once seated at EOB ?  ? ?Transfers ?Overall transfer level: Needs assistance ?Equipment used: Rolling walker (2 wheels) ?Transfers: Sit to/from Stand ?Sit to Stand: Mod assist, +2 safety/equipment, +2 physical assistance, Min assist ?  ?  ?  ?  ?  ?General transfer comment: needs cues for hand placement. RW used. Heavy cues for sequencing and upright posture with forward flexed posture. Spontaneous sitting noted with multiple attempts for static standing. Further mobility attempts not safe to perform ?  ? ?Ambulation/Gait ?  ?  ?  ?  ?  ?  ?  ?General Gait Details: unable to perform ? ?Stairs ?  ?  ?  ?  ?  ? ?Wheelchair Mobility ?  ? ?Modified Rankin (Stroke Patients Only) ?  ? ?  ? ?  Balance Overall balance assessment: Needs assistance ?Sitting-balance support: Feet supported, Single extremity supported ?Sitting balance-Leahy Scale: Fair ?  ?  ?Standing balance support: Reliant on assistive device  for balance, Bilateral upper extremity supported ?Standing balance-Leahy Scale: Zero ?Standing balance comment: +2 assist for safety ?  ?  ?  ?  ?  ?  ?  ?  ?  ?  ?  ?   ? ? ? ?Pertinent Vitals/Pain    ? ? ?Home Living Family/patient expects to be discharged to:: Private residence ?Living Arrangements:  (girlfriend) ?Available Help at Discharge: Family;Available PRN/intermittently ?Type of Home: House ?Home Access: Stairs to enter ?Entrance Stairs-Rails: Right;Left;Can reach both ?Entrance Stairs-Number of Steps: 5 ?  ?Home Layout: One level ?Home Equipment: Agricultural consultantolling Walker (2 wheels);Wheelchair - manual ?Additional Comments: Pt reports girlfriend and son live with him. 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  ?  ?Prior Function Prior Level of Function : Independent/Modified Independent;Driving ?  ?  ?  ?  ?  ?  ?Mobility Comments: pt reports he drives and was only recently using RW. He endorses, " I only fall when I drink". ?ADLs Comments: Pt reports being independent in self care and assisting with som IADLs such as laundry at baseline. ?  ? ? ?Hand Dominance  ? Dominant Hand: Right ? ?  ?Extremity/Trunk Assessment  ? Upper Extremity Assessment ?Upper Extremity Assessment: Defer to OT evaluation ?LUE Deficits / Details: decreased dexterity, speed, and coordination but overall appears functional with ROM and strength ?  ? ?Lower Extremity Assessment ?Lower Extremity Assessment: Generalized weakness (L LE rest in ER, appears 3+/5; R LE grossly 4/5) ?  ? ?   ?Communication  ? Communication: No difficulties  ?Cognition Arousal/Alertness: Awake/alert ?Behavior During Therapy: Impulsive, Flat affect ?Overall Cognitive Status: No family/caregiver present to determine baseline cognitive functioning ?Area of Impairment: Rancho level ?  ?  ?  ?  ?  ?  ?  ?Rancho Levels of Cognitive Functioning ?Rancho MirantLos Amigos Scales of Cognitive Functioning:  Confused/appropriate ?  ?  ?  ?  ?  ?  ?  ?General Comments: pleasant, poor safety awareness ?  ?Rancho MirantLos Amigos Scales of Cognitive Functioning: Confused/appropriate ? ?  ?General Comments   ? ?  ?Exercises Other Exercises ?Other Exercises: multiple sit<>Stands with +2, only able to stand for short time with uncontrolled decent back to bed. Poor safety awareness. HR elevated to 131bpm with exertion ?Other Exercises: pt found on bed pan upon arrival. Pt didn't realize he was on bed pan. No urine or BM noted. Assisted in rolling to remove bed pan.  ? ?Assessment/Plan  ?  ?PT Assessment Patient needs continued PT services  ?PT Problem List Decreased strength;Decreased activity tolerance;Decreased balance;Decreased mobility;Decreased cognition;Decreased coordination;Decreased safety awareness ? ?   ?  ?PT Treatment Interventions Gait training;Stair training;Therapeutic activities;Therapeutic exercise;Balance training   ? ?PT Goals (Current goals can be found in the Care Plan section)  ?Acute Rehab PT Goals ?Patient Stated Goal: to go home ?PT Goal Formulation: With patient ?Time For Goal Achievement: 05/19/21 ?Potential to Achieve Goals: Good ? ?  ?Frequency Min 2X/week ?  ? ? ?Co-evaluation PT/OT/SLP Co-Evaluation/Treatment: Yes ?Reason for Co-Treatment: Complexity of the patient's impairments (multi-system involvement);For patient/therapist safety;Necessary to address cognition/behavior during functional activity ?PT goals addressed during session: Mobility/safety with mobility;Balance ?OT goals addressed during session: ADL's and self-care;Proper use of Adaptive equipment and DME ?  ? ? ?  ?  AM-PAC PT "6 Clicks" Mobility  ?Outcome Measure Help needed turning from your back to your side while in a flat bed without using bedrails?: A Lot ?Help needed moving from lying on your back to sitting on the side of a flat bed without using bedrails?: A Lot ?Help needed moving to and from a bed to a chair (including a  wheelchair)?: A Lot ?Help needed standing up from a chair using your arms (e.g., wheelchair or bedside chair)?: A Lot ?Help needed to walk in hospital room?: Total ?Help needed climbing 3-5 steps with a railing? : Total ?6 C

## 2021-05-05 NOTE — Progress Notes (Signed)
Inpatient Rehab Admissions Coordinator Note:  ? ?Per PT recommendations patient was screened for CIR candidacy by Stephania Fragmin, PT. At this time, pt appears to be a potential candidate for CIR. I will place an order for rehab consult for full assessment, per our protocol.  Please contact me any with questions.. ? ?Estill Dooms, PT, DPT ?[phone] ?05/05/21 ?4:31 PM ? ?

## 2021-05-05 NOTE — Consult Note (Signed)
PHARMACY CONSULT NOTE - FOLLOW UP ? ?Pharmacy Consult for Electrolyte Monitoring and Replacement  ? ?Recent Labs: ?Potassium (mmol/L)  ?Date Value  ?05/05/2021 4.2  ? ?Magnesium (mg/dL)  ?Date Value  ?05/05/2021 1.8  ? ?Calcium (mg/dL)  ?Date Value  ?05/05/2021 8.7 (L)  ? ?Albumin (g/dL)  ?Date Value  ?05/04/2021 3.1 (L)  ?03/03/2021 3.0 (L)  ? ?Phosphorus (mg/dL)  ?Date Value  ?05/05/2021 3.1  ? ?Sodium (mmol/L)  ?Date Value  ?05/05/2021 142  ?03/03/2021 145 (H)  ? ?Corrected Calcium: 9.3 mg/dL ? ?Assessment: ?66yo M presents to ED with complaint of increased falls last couple of months, most recent being two days ago. Patient also reports increased alcohol intake, most recent drink being a few hours ago. Pharmacy consulted for electrolyte monitoring and replacement.  ? ?Goal of Therapy:  ?Electrolytes WNL ? ?Plan:  ?No supplementation currently needed, labs WNL  ?Will recheck electrolytes with AM labs ? ?Martyn Malay ,PharmD ?Clinical Pharmacist ?05/05/2021 7:30 AM  ?

## 2021-05-05 NOTE — Progress Notes (Signed)
Consult acknowledged. ? ?Chart reviewed. Patient assessed at bedside. No family/friends present. Pt declined to speak with me today, saying he would like to talk later. When asked what time would be good for me to return, he said he wasn't sure. I offered to return later today and he said no. He asked that I find someone to help him go to the bathroom. I shared that I will return to see him tomorrow and he said okay.  ? ?Nursing notified of patient's needs and that patient did not want to speak with me today. I am on service and plan to round on the patient again tomorrow, 3/28. ? ?PMT will continue to follow the patient throughout his hospitalization and attempt to establish GOC and ACP discussions with patient. ? ?Dalbert Mayotte. Saahas Hidrogo, DNP, FNP-BC ?Palliative Medicine Team ?Team Phone # [phone] ? ?NO CHARGE ?

## 2021-05-06 ENCOUNTER — Inpatient Hospital Stay: Payer: Medicaid Other

## 2021-05-06 LAB — GLUCOSE, CAPILLARY
Glucose-Capillary: 100 mg/dL — ABNORMAL HIGH (ref 70–99)
Glucose-Capillary: 137 mg/dL — ABNORMAL HIGH (ref 70–99)
Glucose-Capillary: 137 mg/dL — ABNORMAL HIGH (ref 70–99)
Glucose-Capillary: 137 mg/dL — ABNORMAL HIGH (ref 70–99)
Glucose-Capillary: 140 mg/dL — ABNORMAL HIGH (ref 70–99)
Glucose-Capillary: 87 mg/dL (ref 70–99)

## 2021-05-06 LAB — BASIC METABOLIC PANEL
Anion gap: 8 (ref 5–15)
BUN: 9 mg/dL (ref 8–23)
CO2: 29 mmol/L (ref 22–32)
Calcium: 8.4 mg/dL — ABNORMAL LOW (ref 8.9–10.3)
Chloride: 101 mmol/L (ref 98–111)
Creatinine, Ser: 0.96 mg/dL (ref 0.61–1.24)
GFR, Estimated: >60 mL/min (ref >60)
Glucose, Bld: 93 mg/dL (ref 70–99)
Potassium: 3.3 mmol/L — ABNORMAL LOW (ref 3.5–5.1)
Sodium: 138 mmol/L (ref 135–145)

## 2021-05-06 LAB — MAGNESIUM: Magnesium: 2.1 mg/dL (ref 1.7–2.4)

## 2021-05-06 LAB — PHOSPHORUS: Phosphorus: 2.3 mg/dL — ABNORMAL LOW (ref 2.5–4.6)

## 2021-05-06 MED ORDER — DOCUSATE SODIUM 100 MG PO CAPS
100.0000 mg | ORAL_CAPSULE | Freq: Two times a day (BID) | ORAL | Status: DC
Start: 2021-05-06 — End: 2021-05-13
  Administered 2021-05-06 – 2021-05-12 (×13): 100 mg via ORAL
  Filled 2021-05-06 (×13): qty 1

## 2021-05-06 MED ORDER — POTASSIUM CHLORIDE CRYS ER 20 MEQ PO TBCR
40.0000 meq | EXTENDED_RELEASE_TABLET | Freq: Once | ORAL | Status: AC
  Administered 2021-05-06: 40 meq via ORAL
  Filled 2021-05-06: qty 2

## 2021-05-06 MED ORDER — POLYETHYLENE GLYCOL 3350 17 G PO PACK
17.0000 g | PACK | Freq: Every day | ORAL | Status: DC
  Administered 2021-05-06 – 2021-05-12 (×5): 17 g via ORAL
  Filled 2021-05-06 (×5): qty 1

## 2021-05-06 MED ORDER — K PHOS MONO-SOD PHOS DI & MONO 155-852-130 MG PO TABS
500.0000 mg | ORAL_TABLET | ORAL | Status: AC
  Administered 2021-05-06: 500 mg via ORAL
  Filled 2021-05-06 (×2): qty 2

## 2021-05-06 NOTE — Progress Notes (Signed)
Occupational Therapy Treatment ?Patient Details ?Name: Charles Schmitt. ?MRN: [id number] ?DOB: January 19, 1956 ?Today's Date: 05/06/2021 ? ? ?History of present illness [name] presenting to [facility] ED from home via EMS after heavy drinking these past 2 weeks and multiple falls at home. The patient is unsure why he needed to come to the hospital, but does admit he fell out of bed and had difficulty getting up this morning because of weakness in his legs. Several CT scans showing traumatic SDH, SAH, and midline shift. ?  ?OT comments ? Upon entering the room, pt seated in recliner chair. He was unable to verbalize who assisted him to chair. Pt requesting to return to bed although he just got into recliner chair. OT reminding pt that the expectations of session were standing and ambulation with plans to return to chair not bed at end of session. Pt is hyperfocused on bed and needing mod - max multimodal cues for redirection to task. Pt standing with mod A with RW. Pt ambulates 7' with RW with manual assistance for upright positioning, weight shift, and manual assist for advancement of L LE. Pt turns and sits on EOB to rest with mod A for sitting balance secondary to pt attempting to return to bed. Pt standing once more to return to chair in same manner as above. However, pt continues to attempt to sit before getting to chair. Therapist having to provide anterior weight shift and manual position pt for safety as he believes therapist behind him is chair. Chair alarm lap belt donned for safety as pt is very impulsive and not aware of deficits. He states, " I should be walking and going home by Thursday". Pt internally and externally distracted throughout session. Demands much higher on pt with ambulation. Pt is [place] level V this session. Continued recommendation for SNF at discharge.   ? ?Recommendations for follow up therapy are one component of a multi-disciplinary discharge planning process, led by the attending physician.   Recommendations may be updated based on patient status, additional functional criteria and insurance authorization. ?   ?Follow Up Recommendations ? Skilled nursing-short term rehab (<3 hours/day)  ?  ?Assistance Recommended at Discharge Frequent or constant Supervision/Assistance  ?Patient can return home with the following ? Two people to help with walking and/or transfers;Two people to help with bathing/dressing/bathroom;Assistance with cooking/housework;Help with stairs or ramp for entrance;Assist for transportation;Direct supervision/assist for medications management;Direct supervision/assist for financial management ?  ?Equipment Recommendations ? Other (comment) (defer to next venue of care)  ?  ?   ?Precautions / Restrictions Precautions ?Precautions: Fall ?Restrictions ?Weight Bearing Restrictions: No  ? ? ?  ? ?Mobility Bed Mobility ?  ?  ?  ?  ?  ?  ?  ?General bed mobility comments: seated in recliner chair ?  ? ?Transfers ?Overall transfer level: Needs assistance ?Equipment used: Rolling walker (2 wheels) ?Transfers: Sit to/from Stand ?Sit to Stand: Mod assist ?  ?  ?  ?  ?  ?General transfer comment: mod sit <>stand  x 4 reps this session with max cuing for technique. ?  ?  ?Balance Overall balance assessment: Needs assistance ?Sitting-balance support: Feet supported, Single extremity supported ?Sitting balance-Leahy Scale: Fair ?  ?  ?Standing balance support: Reliant on assistive device for balance, Bilateral upper extremity supported ?Standing balance-Leahy Scale: Zero ?  ?  ?  ?  ?  ?  ?  ?  ?  ?  ?  ?  ?   ? ?  ADL either performed or assessed with clinical judgement  ? ? ? ?Vision Patient Visual Report: No change from baseline ?  ?  ?   ?   ? ?Cognition Arousal/Alertness: Awake/alert ?Behavior During Therapy: Impulsive, Flat affect ?Overall Cognitive Status: No family/caregiver present to determine baseline cognitive functioning ?Area of Impairment: Rancho level ?  ?  ?  ?  ?  ?  ?  ?Rancho Levels  of Cognitive Functioning ?Rancho Mirant Scales of Cognitive Functioning: Confused/inappropriate/non-agitated ?  ?  ?  ?  ?  ?  ?  ?General Comments: poor safety awareness with mod- max multimodal cuing throughout and pt appearing to be internally and externally distracted throughout ?[place]anch[place] Mirant Scales of Cognitive Functioning: Confused/inappropriate/non-agitated ?  ?   ?   ?   ?   ? ? ?Pertinent Vitals/ Pain       Pain Assessment ?Pain Assessment: Faces ?Faces Pain Scale: No hurt ? ?   ?   ? ?Frequency ? Min 3X/week  ? ? ? ? ?  ?Progress Toward Goals ? ?OT Goals(current goals can now be found in the care plan section) ? Progress towards OT goals: Progressing toward goals ? ?Acute Rehab OT Goals ?Patient Stated Goal: go home and feed horses ?OT Goal Formulation: With patient ?Time For Goal Achievement: 05/19/21 ?Potential to Achieve Goals: Good  ?Plan Discharge plan remains appropriate;Frequency remains appropriate   ? ?   ?AM-PAC OT "6 Clicks" Daily Activity     ?Outcome Measure ? ? Help from another person eating meals?: A Little ?Help from another person taking care of personal grooming?: A Little ?Help from another person toileting, which includes using toliet, bedpan, or urinal?: Total ?Help from another person bathing (including washing, rinsing, drying)?: Total ?Help from another person to put on and taking off regular upper body clothing?: A Little ?Help from another person to put on and taking off regular lower body clothing?: Total ?6 Click Score: 12 ? ?  ?End of Session Equipment Utilized During Treatment: Rolling walker (2 wheels) ? ?OT Visit Diagnosis: Unsteadiness on feet (R26.81);Repeated falls (R29.6);Muscle weakness (generalized) (M62.81) ?  ?Activity Tolerance Patient tolerated treatment well ?  ?Patient Left with call bell/phone within reach;in chair;with chair alarm set ?  ?Nurse Communication Mobility status ?  ? ?   ? ?Time: 9767-3419 ?OT Time Calculation (min): 31 min ? ?Charges: OT  General Charges ?$OT Visit: 1 Visit ?OT Treatments ?$Neuromuscular Re-education: 23-37 mins ? ?Jackquline Denmark, MS, OTR/L , CBIS ?ascom [phone]  ?05/06/21, 4:29 PM  ?

## 2021-05-06 NOTE — Progress Notes (Signed)
?PROGRESS NOTE ? ? ? ?[name]  OKH:[id number] DOB: 07-19-1955 DOA: 05/03/2021 ?PCP: Pcp, No  ? ? ?Brief Narrative:  ?[name] presenting to [facility] ED from home via EMS after heavy drinking these past 2 weeks and multiple falls at home. The patient is unsure why he needed to come to the hospital, but does admit he fell out of bed and had difficulty getting up this morning because of weakness in his legs. ?  ?In talking to his significant other, Corrie Dandy, who he lives with via the telephone- she explained that he had fallen out of bed twice last night and she had difficulty getting him back into bed because he was unable to help her. She reports that he had been "cutting back" on his alcohol consumption, drinking "a beer occasionally" for about 2 weeks. Then 2 weeks ago, 04/14/21?, he reverted to drinking "heavily". The patient describes drinking a 1/2 gallon of vodka every 3 days and Corrie Dandy, who he lives with confirmed that sounded right. Of note, in talking to Georjean Mode", the patient's daughter in law, she reports that his normal drinking habits are equal to a 1/2 gallon of vodka daily. Neither relative is sure why the change but that over the past 2 weeks he has been incontinent, confused, laying around the house drinking. Corrie Dandy reports that when he cut back she didn't notice any severe withdrawal symptoms but reports that he has been vomiting and not eating any solid food for the past 3 weeks or so. ? ?Patient admitted directly to the ICU service.  Neurosurgery consulted.  Serial CTs show stability of bleed with 3 mm midline shift.  No neurosurgical intervention warranted at this time.  Per neurosurgery cleared for initiation of PT OT and DVT prophylaxis on 3/27. ? ?Patient lethargic on my exam.  Unable to provide history. ? ? ?Assessment & Plan: ?  ?Principal Problem: ?  SAH (subarachnoid hemorrhage) (HCC) ?Active Problems: ?  Subdural hematoma ? ?Acute traumatic subdural hematoma ?Bilateral subarachnoid  hemorrhage ?Intraparenchymal hemorrhagic contusion secondary to multiple falls ?Occurred in the setting of significant alcohol use ?Neurosurgery consulted ?Serial CT stable ?No plans for inpatient neurosurgical evaluation ?Plan: ?Neurochecks every 4 hours ?SBP goal less than 160 ?PT OT as able ?We will likely start DVT prophylaxis 3/28 if repeat head CT is stable ?SLP, PT, OT evaluations ?Continue fall and aspiration precautions ?Would benefit from skilled nursing facility ? ?Alcohol abuse ?High risk for alcohol withdrawal ?Tobacco use ?No clinical signs of acute withdrawal at this time ?Patient drinks as much of half gallon of vodka daily ?Plan: ?Continue Librium taper ?Attempt to avoid over oversedation ?Ativan IV as needed for severe symptoms/seizures ?TOC consult ?Daily thiamine ?Folic acid and multivitamin daily ?Smoking cessation counseling ? ?Hypokalemia ?Hypoalbuminemia ?Suspected severe protein calorie malnutrition ?Replace electrolytes ?Daily BMP ?RD consultation ? ?Transaminitis ?Likely representative of mild alcoholic hepatitis ?No evidence of abdominal discomfort or ascites ?Plan: ?Trend hepatic function ?Avoid hepatotoxins ? 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 ? ?DVT prophylaxis: SCD ?Code Status: Full ?Family Communication: Attempted to call both Luna Kitchens and Corrie Dandy, patient contacts listed in chart.  No answer from either person.  No ability to leave voicemail ?Disposition Plan: Status is: Inpatient ?Remains inpatient appropriate because: Traumatic subdural.  High risk for alcohol withdrawal.  Clinically improving.  Will need skilled nursing facility. ? ? ?  Level of care: Med-Surg ? ?Consultants:  ?Neurosurgery ? ?Procedures:  ?None ? ?Antimicrobials: ?None ? ? ?Subjective: ?Seen  and examined.  Lethargic but awaken.  Difficult to maintain a conversation ? ?Objective: ?Vitals:  ? 05/06/21 0600 05/06/21 0800 05/06/21 1000 05/06/21 1200  ?BP: 101/78 98/73 100/77 107/74  ?Pulse: 84 85  96  ?Resp: 17 17 15    ?Temp:      ?TempSrc:  Oral  Oral  ?SpO2: 100% 100%  100%  ?Weight:      ?Height:      ? ? ?Intake/Output Summary (Last 24 hours) at 05/06/2021 1216 ?Last data filed at 05/06/2021 1200 ?Gross per 24 hour  ?Intake 290 ml  ?Output 750 ml  ?Net -460 ml  ? ?Filed Weights  ? 05/03/21 2045 05/05/21 0423 05/06/21 0401  ?Weight: 57.6 kg 62.5 kg 61.2 kg  ? ? ?Examination: ? ?General exam: Alert but easily awakened.  Appears chronically ill and frail ?Respiratory system: Poor respiratory effort.  Lungs clear.  Normal work of breathing.  Room air ?Cardiovascular system: Tachycardic, regular rhythm, no murmurs, no pedal edema ?Gastrointestinal system: Soft, NT/ND, normal bowel sounds ?Central nervous system: Oriented to person and place.  No focal deficits ?Extremities: Diffusely decreased power bilaterally ?Skin: No rashes, lesions or ulcers ?Psychiatry: Judgement and insight appear impaired. Mood & affect flattened.  ? ? ? ?Data Reviewed: I have personally reviewed following labs and imaging studies ? ?CBC: ?Recent Labs  ?Lab 05/03/21 ?1937 05/04/21 ?[id number] 05/05/21 ?[id number]  ?WBC 4.8 4.1 2.0*  ?HGB 11.9* 9.8* 10.4*  ?HCT 35.2* 28.6* 30.8*  ?MCV 98.1 98.3 98.1  ?PLT 115* 106* 102*  ? ?Basic Metabolic Panel: ?Recent Labs  ?Lab 05/03/21 ?1735 05/03/21 ?[id number] 05/04/21 ?[id number] 05/04/21 ?1210 05/05/21 ?[id number] 05/06/21 ?[id number]  ?NA 139  --  142 137 142 138  ?K 2.8*  --  3.8 4.0 4.2 3.3*  ?CL 98  --  102 98 101 101  ?CO2 25  --  26 28 31 29   ?GLUCOSE 86  --  62* 108* 146* 93  ?BUN 8  --  9 9 9 9   ?CREATININE 0.80  --  0.73 0.84 0.92 0.96  ?CALCIUM 8.5*  --  8.4* 8.5* 8.7* 8.4*  ?MG  --  1.9 2.6* 2.2 1.8 2.1  ?PHOS  --  3.5 3.4 2.7 3.1 2.3*  ? ?GFR: ?Estimated Creatinine Clearance: 68.2 mL/min (by C-G formula based on SCr  of 0.96 mg/dL). ?Liver Function Tests: ?Recent Labs  ?Lab 05/03/21 ?1937 05/04/21 ?0506  ?AST 47* 44*  ?ALT 24 22  ?ALKPHOS 127* 110  ?BILITOT 1.1 1.2  ?PROT 7.6 6.5  ?ALBUMIN 3.3* 3.1*  ? ?No results for input(s): LIPASE, AMYLASE in the last 168 hours. ?No results for input(s): AMMONIA in the last 168 hours. ?Coagulation Profile: ?Recent Labs  ?Lab 05/03/21 ?1937 05/04/21 ?0724  ?INR 1.0 1.1  ? ?Cardiac Enzymes: ?No results for input(s): CKTOTAL, CKMB, CKMBINDEX, TROPONINI in the last 168 hours. ?BNP (last 3 results) ?No results for input(s): PROBNP in the last 8760 hours. ?HbA1C: ?No results for input(s): HGBA1C in the last 72 hours. ?CBG: ?Recent Labs  ?Lab 05/05/21 ?1932 05/05/21 ?2326 05/06/21 ?0320 05/06/21 ?[id number] 05/06/21 ?1128  ?GLUCAP 117* 108* 100* 87 137*  ? ?Lipid Profile: ?No results for input(s): CHOL, HDL, LDLCALC, TRIG, CHOLHDL, LDLDIRECT in the last 72 hours. ?Thyroid Function Tests: ?No results for input(s): TSH, T4TOTAL, FREET4, T3FREE, THYROIDAB in the last 72 hours. ?Anemia Panel: ?Recent Labs  ?  05/04/21 ?  0947  ?VITAMINB12 296  ?FOLATE 35.0  ? ?Sepsis Labs: ?No results for input(s): PROCALCITON, LATICACIDVEN in the last 168 hours. ? ?Recent Results (from the past 240 hour(s))  ?Resp Panel by RT-PCR (Flu A&B, Covid) Nasopharyngeal Swab     Status: None  ? Collection Time: 05/03/21  7:37 PM  ? Specimen: Nasopharyngeal Swab; Nasopharyngeal(NP) swabs in vial transport medium  ?Result Value Ref Range Status  ? SARS Coronavirus 2 by RT PCR NEGATIVE NEGATIVE Final  ?  Comment: (NOTE) ?SARS-CoV-2 target nucleic acids are NOT DETECTED. ? ?The SARS-CoV-2 RNA is generally detectable in upper respiratory ?specimens during the acute phase of infection. The lowest ?concentration of SARS-CoV-2 viral copies this assay can detect is ?138 copies/mL. A negative result does not preclude SARS-Cov-2 ?infection and should not be used as the sole basis for treatment or ?other patient management decisions. A negative  result may occur with  ?improper specimen collection/handling, submission of specimen other ?than nasopharyngeal swab, presence of viral mutation(s) within the ?areas targeted by this assay, and inadequate numb

## 2021-05-06 NOTE — Consult Note (Addendum)
? ?                                                                                ?Consultation Note ?Date: 05/06/2021  ? ?Patient Name: Charles Schmitt.  ?DOB: 09-18-1955  MRN: [id number]  Age / Sex: 66 y.o., male  ?PCP: Pcp, No ?Referring Physician: Sidney Ace, MD ? ?Reason for Consultation: Establishing goals of care ? ?HPI/Patient Profile: 66 y.o. male  with past medical history of etoh abuse, chronic pAF (no AC), cardiomyopathy, HTN, protein calorie malnutrition admitted on 05/03/2021 s/p fall at home with subarachnoid hemorrhage. Serial CTs reveals 50m midline shift with no neurosurgical intervention warranted. Neuro cleared pt for PT/OT and DVT prophylaxis. Pt is on librium taper for alcohol withdrawal with CIWAs = 0-1. He is being evaluated for CIR.     ? ?Clinical Assessment and Goals of Care: ?I have reviewed medical records including EPIC notes, labs and imaging, assessed the patient and then met with patient at bedside  to discuss diagnosis prognosis, GOC, EOL wishes, disposition and options. ? ?I introduced Palliative Medicine as specialized medical care for people living with serious illness. It focuses on providing relief from the symptoms and stress of a serious illness. The goal is to improve quality of life for both the patient and the family. ? ?I attempted to discuss a brief life review of the patient but pt kept asking when he was going to get to go home. He shares he can stand up, walk, and ride his horses. He says he only falls down when he is drinking.   ? ?As far as functional and nutritional status pt reports he was doing "just fine" at home. He denies difficulty completing ADLs. Again, I did not get very far into his history PTA because pt remained focused on knowing when he could leave the hospital.  ? ?I attempted to elicit values and goals of care important to the patient.  He again stated he wants to know when he can leave the hospital.  ? ?Reviewed that the patient is  functionally weak.  Discussed in detail that while he is now sober and not drinking, he still does not have the full strength to be able to stand walk and perform ADLs independently.  Patient seems surprised by this statement.   ? ?I shared that he is at a different place physically than he has been in the past.  He is malnutritioned and his body is not as strong as perhaps his mind thinks it is.  He says he can eat well and fix that problem in the day.   ? ?Patient shares he knows he can get to the bedside commode without help.  I shared that it would be unsafe for him to attempt to do so.  I outlined that we are attempting to have him evaluated for rehabilitation for PT and OT in order to help build his strength. ? ?Patient continues to reiterate that he only falls when he has been drinking  I continued to reiterate that the patient is weak and not steady even though he is currently not drinking. He continues to change the subject and ask for  a bedpan.   ? ?Patient is facing treatment option decisions, advanced directive, and anticipatory care needs though appears not to acknowledge these barriers.  ? ?PMT will continue to attempt GOC and ACP discussions with patient throughout his hospitalization.  ? ?Primary Decision Maker ?PATIENT ? ?Code Status/Advance Care Planning: ?Full code ? ?Prognosis:   ?Unable to determine ? ?Discharge Planning: To Be Determined ? ?Physical Exam ?Vitals and nursing note reviewed.  ?Constitutional:   ?   Comments: Frail, thin  ?HENT:  ?   Head: Normocephalic.  ?   Mouth/Throat:  ?   Mouth: Mucous membranes are moist.  ?Eyes:  ?   Pupils: Pupils are equal, round, and reactive to light.  ?Cardiovascular:  ?   Rate and Rhythm: Normal rate. Rhythm irregular.  ?Pulmonary:  ?   Effort: Pulmonary effort is normal.  ?Abdominal:  ?   Palpations: Abdomen is soft.  ?Musculoskeletal:  ?   Comments: Generalized weakness  ?Skin: ?   General: Skin is warm.  ?Neurological:  ?   Mental Status: He is  alert.  ? ? ?Palliative Assessment/Data: 40% ? ? ? ? ?I discussed this patient's plan of care with patient, RN Jamie. ? ?Thank you for this consult. Palliative medicine will continue to follow and assist holistically.  ? ?Time Total: 75 minutes ?Greater than 50%  of this time was spent counseling and coordinating care related to the above assessment and plan. ? ?Signed by: ? , DNP, FNP-BC ?Palliative Medicine ? ?  ?Please contact Palliative Medicine Team phone at [phone] for questions and concerns.  ?For individual provider: See Amion ? ? ? ? ? ? ? ? ? ? ? ? ?  ?

## 2021-05-06 NOTE — Plan of Care (Signed)

## 2021-05-06 NOTE — Progress Notes (Signed)
Physical Therapy Treatment ?Patient Details ?Name: Charles Schmitt. ?MRN: [id number] ?DOB: 1955/09/01 ?Today's Date: 05/06/2021 ? ? ?History of Present Illness [name] presenting to [facility] ED from home via EMS after heavy drinking these past 2 weeks and multiple falls at home. The patient is unsure why he needed to come to the hospital, but does admit he fell out of bed and had difficulty getting up this morning because of weakness in his legs. Several CT scans showing traumatic SDH, SAH, and midline shift. ? ?  ?PT Comments  ? ? Pt is making gradual progress towards goals, currently Rancho 5 this date. Pt with poor insight and safety. Pt impulsive and tries to get OOB prior to instruction. Increased L LE hemiplegia with foot drop. Unable to sequence stepping with L foot needing manual facilitation for movement vs dragging. Pt reports he will be "fine once he gets home" however at end of session begins to sense awareness of his deficits and is somewhat agreeable to further therapy post hospital stay. Education about inpatient rehab as this is currently recommended at this time. Pt is very high falls risk but happy he is out of bed to chair. Will continue to progress as able.  ?Recommendations for follow up therapy are one component of a multi-disciplinary discharge planning process, led by the attending physician.  Recommendations may be updated based on patient status, additional functional criteria and insurance authorization. ? ?Follow Up Recommendations ? Acute inpatient rehab (3hours/day) ?  ?  ?Assistance Recommended at Discharge Frequent or constant Supervision/Assistance  ?Patient can return home with the following Two people to help with walking and/or transfers;A lot of help with bathing/dressing/bathroom;Direct supervision/assist for medications management;Direct supervision/assist for financial management;Assist for transportation;Help with stairs or ramp for entrance ?  ?Equipment Recommendations ?  (TBD)   ?  ?Recommendations for Other Services   ? ? ?  ?Precautions / Restrictions Precautions ?Precautions: Fall ?Restrictions ?Weight Bearing Restrictions: No  ?  ? ?Mobility ? Bed Mobility ?Overal bed mobility: Needs Assistance ?Bed Mobility: Supine to Sit, Sit to Supine ?  ?  ?Supine to sit: Mod assist ?  ?  ?General bed mobility comments: improved technique with mod A of 1 to sit at EOB. Good task initiation with slightly impulsive technique sliding B LEs off bed prior to lowering bed rail. Pt on bed pan upon arrival with cues for rolling prior to performing OOB. ?  ? ?Transfers ?Overall transfer level: Needs assistance ?Equipment used: Rolling walker (2 wheels) ?Transfers: Sit to/from Stand ?Sit to Stand: Mod assist ?  ?  ?  ?  ?  ?General transfer comment: mod A with RW with cues for L foot placement. Once standing, tends to weightshift towards R side with manual cues for symmetrical WBing. ?  ? ?Ambulation/Gait ?Ambulation/Gait assistance: Mod assist, Max assist, +2 physical assistance ?Gait Distance (Feet): 3 Feet ?Assistive device: Rolling walker (2 wheels) ?Gait Pattern/deviations: Step-to pattern ?  ?  ?  ?General Gait Details: RN present in room to provide additional safety measures although ambulation performed primarily by PT. Pt has difficulty with L LE placement due to weakness. Tends to slide on floor. Needs manual assist for stepping. Pt impulsive and tries to sit prematurely before safe seated surface available. ? ? ?Stairs ?  ?  ?  ?  ?  ? ? ?Wheelchair Mobility ?  ? ?Modified Rankin (Stroke Patients Only) ?  ? ? ?  ?Balance Overall balance assessment: Needs assistance ?Sitting-balance support: Feet  supported, Single extremity supported ?Sitting balance-Leahy Scale: Fair ?  ?  ?Standing balance support: Reliant on assistive device for balance, Bilateral upper extremity supported ?Standing balance-Leahy Scale: Zero ?  ?  ?  ?  ?  ?  ?  ?  ?  ?  ?  ?  ?  ? ?  ?Cognition Arousal/Alertness:  Awake/alert ?Behavior During Therapy: Impulsive, Flat affect ?Overall Cognitive Status: No family/caregiver present to determine baseline cognitive functioning ?Area of Impairment: Rancho level ?  ?  ?  ?  ?  ?  ?  ?Rancho Levels of Cognitive Functioning ?Rancho Mirant Scales of Cognitive Functioning: Confused/inappropriate/non-agitated ?  ?  ?  ?  ?  ?  ?  ?General Comments: poor safety awareness and judgement. Impulsive ?  ?Rancho Mirant Scales of Cognitive Functioning: Confused/inappropriate/non-agitated ? ?  ?Exercises Other Exercises ?Other Exercises: seated ther-ex performed on B LE including LAQ, AP, hip add squeezes, and alt marching. 10 reps with supervision for R LE and max assist with L LE. HR increases to 113bpm and O2 sats at 94% on RA. RR increases with exertion. ? ?  ?General Comments   ?  ?  ? ?Pertinent Vitals/Pain Pain Assessment ?Pain Assessment: Faces ?Faces Pain Scale: Hurts little more ?Pain Location: L hip ?Pain Descriptors / Indicators: Aching ?Pain Intervention(s): Limited activity within patient's tolerance, Repositioned  ? ? ?Home Living   ?  ?  ?  ?  ?  ?  ?  ?  ?  ?   ?  ?Prior Function    ?  ?  ?   ? ?PT Goals (current goals can now be found in the care plan section) Acute Rehab PT Goals ?Patient Stated Goal: to go home ?PT Goal Formulation: With patient ?Time For Goal Achievement: 05/19/21 ?Potential to Achieve Goals: Good ?Progress towards PT goals: Progressing toward goals ? ?  ?Frequency ? ? ? Min 2X/week ? ? ? ?  ?PT Plan Current plan remains appropriate  ? ? ?Co-evaluation   ?  ?  ?  ?  ? ?  ?AM-PAC PT "6 Clicks" Mobility   ?Outcome Measure ? Help needed turning from your back to your side while in a flat bed without using bedrails?: A Little ?Help needed moving from lying on your back to sitting on the side of a flat bed without using bedrails?: A Lot ?Help needed moving to and from a bed to a chair (including a wheelchair)?: A Lot ?Help needed standing up from a chair  using your arms (e.g., wheelchair or bedside chair)?: A Lot ?Help needed to walk in hospital room?: Total ?Help needed climbing 3-5 steps with a railing? : Total ?6 Click Score: 11 ? ?  ?End of Session Equipment Utilized During Treatment: Gait belt ?Activity Tolerance: Patient tolerated treatment well ?Patient left: in chair;with family/visitor present ?Nurse Communication: Mobility status ?PT Visit Diagnosis: Unsteadiness on feet (R26.81);Repeated falls (R29.6);Muscle weakness (generalized) (M62.81);History of falling (Z91.81);Difficulty in walking, not elsewhere classified (R26.2) ?  ? ? ?Time: 3086-5784 ?PT Time Calculation (min) (ACUTE ONLY): 24 min ? ?Charges:  $Therapeutic Exercise: 8-22 mins ?$Therapeutic Activity: 8-22 mins          ?          ? ?Elizabeth Palau, PT, DPT, GCS ?[phone] ? ? ? ?Charles Schmitt ?05/06/2021, 4:39 PM ? ?

## 2021-05-06 NOTE — Progress Notes (Addendum)
? ?   Attending Progress Note ? ?History: Charles Aigner. is here for subdural hematoma and alcohol abuse ? ?HD4: NAEO ? ?HD2: No complaints ? ?History of Present Illness: ?05/03/2021 ?Charles Belue. is a 66 y.o. male who presents with the chief complaint of multiple falls due to alcohol intoxication.  EMS was called due to a change in status.  He was not very mobile in ER, and had multiple lacerations on his face and head of different ages, so HCT was performed.  This showed an acute SDH. ?  ?The patient has since sobered, though he is not completely sober, and is able to answer questions.  He has no HA, N, or V.  He denies weakness.  He cannot provide further history. ?  ?He reports he had a couple of shots of vodka today.  His family reports his alcohol intake is significant, potentially a half gallon a day. ?  ? ?Physical Exam: ?Vitals:  ? 05/06/21 0500 05/06/21 0600  ?BP: 117/85 101/78  ?Pulse: 88 84  ?Resp: 19 17  ?Temp:    ?SpO2: 100% 100%  ? ? ?Drowsy but arouses to voice. Oriented to person, place and year ?CNI ?No drift ?MAEW ? ?Data: ? ?Recent Labs  ?Lab 05/04/21 ?1210 05/05/21 ?0223 05/06/21 ?0419  ?NA 137 142 138  ?K 4.0 4.2 3.3*  ?CL 98 101 101  ?CO2 28 31 29   ?BUN 9 9 9   ?CREATININE 0.84 0.92 0.96  ?GLUCOSE 108* 146* 93  ?CALCIUM 8.5* 8.7* 8.4*  ? ? ?Recent Labs  ?Lab 05/04/21 ?0506  ?AST 44*  ?ALT 22  ?ALKPHOS 110  ? ?  ? Recent Labs  ?Lab 05/03/21 ?1937 05/04/21 ?05/05/21 05/05/21 ?5997  ?WBC 4.8 4.1 2.0*  ?HGB 11.9* 9.8* 10.4*  ?HCT 35.2* 28.6* 30.8*  ?PLT 115* 106* 102*  ? ? ?Recent Labs  ?Lab 05/03/21 ?1937 05/04/21 ?0724  ?INR 1.0 1.1  ? ?  ?   ? ? ?Other tests/results: Repeat Hct overnight stable ? ?Assessment/Plan: ? ?05/05/21. Is stable from acute R subdural hematoma ? ?- Continue to monitor for DT's and alcohol withdrawal per ICU team ?-  q4 hr neuro checks ?- PTOT  ?- please call with any questions or concerns. ? ?05/06/21 PA-C ?Department of Neurosurgery ? ? ? ?

## 2021-05-06 NOTE — Progress Notes (Signed)
Inpatient Rehab Admissions Coordinator:  ? ?I spoke with Pt.'s girlfriend regarding potential  CIR admit. She is interested and can provide 24/7 support at home. I have not yet been able to reach patient but will try again tomorrow or come [facility] to [facility] and see him tomorrow afternoon. ? ?Megan Salon, MS, CCC-SLP ?Rehab Admissions Coordinator  ?[phone] (celll) ?[phone] (office) ? ? ?

## 2021-05-06 NOTE — Consult Note (Addendum)
PHARMACY CONSULT NOTE - FOLLOW UP ? ?Pharmacy Consult for Electrolyte Monitoring and Replacement  ? ?Recent Labs: ?Potassium (mmol/L)  ?Date Value  ?05/06/2021 3.3 (L)  ? ?Magnesium (mg/dL)  ?Date Value  ?05/06/2021 2.1  ? ?Calcium (mg/dL)  ?Date Value  ?05/06/2021 8.4 (L)  ? ?Albumin (g/dL)  ?Date Value  ?05/04/2021 3.1 (L)  ?03/03/2021 3.0 (L)  ? ?Phosphorus (mg/dL)  ?Date Value  ?05/06/2021 2.3 (L)  ? ?Sodium (mmol/L)  ?Date Value  ?05/06/2021 138  ?03/03/2021 145 (H)  ? ?Corrected Calcium: 8.72 mg/dL ? ?Assessment: ?66yo M presents to ED with complaint of increased falls last couple of months, most recent being two days ago. Patient also reports increased alcohol intake, most recent drink being a few hours ago. Pharmacy consulted for electrolyte monitoring and replacement.  ? ?Goal of Therapy:  ?Electrolytes WNL ? ?Plan:  ?K 3.3. Provider dosed Kcl 40 meq x 1.  ?Phos low at 2.3. K Phos Neutral 500 mg (2 tabs) every 4 hours x 2  doses ?Will recheck electrolytes with AM labs ? ?Wynelle Cleveland, PharmD ?Pharmacy Resident  ?05/06/2021 ?9:17 AM' ?

## 2021-05-06 NOTE — Evaluation (Addendum)
Speech Language Pathology Evaluation ?Patient Details ?Name: Charles Schmitt. ?MRN: [id number] ?DOB: 01-Jun-1955 ?Today's Date: 05/06/2021 ?Time: 4098-1191; 1155-1210(lunch meal) ?SLP Time Calculation (min) (ACUTE ONLY): 60 min ? ?Problem List:  ?Patient Active Problem List  ? Diagnosis Date Noted  ? SAH (subarachnoid hemorrhage) (HCC) 05/03/2021  ? Subdural hematoma   ? Protein-calorie malnutrition, severe 02/10/2021  ? Underweight 02/08/2021  ? Cardiomyopathy (HCC)   ? Pressure injury of skin 02/05/2021  ? Sinus tachycardia 02/05/2021  ? Bone lesion 02/05/2021  ? Chronic systolic heart failure (HCC) 02/05/2021  ? Hypokalemia 02/05/2021  ? Hyperglycemia 02/05/2021  ? Alcohol use 02/05/2021  ? Multifocal pneumonia   ? Anemia   ? Essential hypertension   ? ?Past Medical History:  ?Past Medical History:  ?Diagnosis Date  ? Alcohol abuse   ? a. 1/5 of vodka daily.  ? Cardiomyopathy (HCC)   ? a. 01/2021 Echo: EF 30-35%, glob HK, apical AK. Mildly reduced RV fxn.  ? HFrEF (heart failure with reduced ejection fraction) (HCC)   ? Hyperlipidemia   ? Hypertension   ? PAF (paroxysmal atrial fibrillation) (HCC)   ? Tobacco abuse   ? ?Past Surgical History:  ?Past Surgical History:  ?Procedure Laterality Date  ? NO PAST SURGERIES    ? ?HPI:  ?Pt is a [name] presenting to [facility] ED from home via EMS after heavy drinking these past 2 weeks and multiple falls at home. The patient is unsure why he needed to come to the hospital, but does admit he fell out of bed and had difficulty getting up this morning because of weakness in his legs. Several CT scans showing traumatic SDH, SAH, and midline shift.  He reports he had a couple of shots of vodka the day of admit.  His family reports his alcohol intake is significant, potentially a half gallon a day.  Recent report  that he has been vomiting and not eating any solid food for the past 3 weeks or so.  Head CTs 05/03/2021-Today at this evaluation: Unchanged right subdural hematoma with mass  effect and 4 mm of  right-to-left midline shift.  2. Unchanged subarachnoid blood in the right sylvian fissure and  right superior frontal lobe hemorrhagic contusions.  3. Unchanged small volume of subdural hemorrhage over the vertex  left frontal lobe, without significant mass effect.  ? ?Assessment / Plan / Recommendation ?Clinical Impression ? Pt seen today for both Cognitive-linguistic evaluation and BSE. Pt was awake, initially feeding self Lunch meal(brief assessment made during the meal during oral intake), then alert and engaged easily w/ this Clinician later in afternoon during f/u assessment.  ? ?Cognitive-linguistic evaluation:  Pt presents with moderate cognitive communication impairments characterized by deficits in attention in more involved tasks, memory/recall of recent information, reduced reasoning and problem-solving for more complex tasks, and reduced insight and awareness of his situation and deficits. These deficits contribute to decreased function in higher level skills of executive function. Pt maintained contact throughout assessment. He seemed to perseverate on need to call (someone) to find out about "my insurance card going to the house so I can call to get some teeth". 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 Unsure of pt's current work status -- will f/u w/ family to confirm. 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 He was cooperative and acknowledged he  had difficulties w/ his memory -- recall accuracy improved w/ semantic cue given. W/ cues given in each Cognitive task, accuracy improved. No immediate Expressive or Receptive Aphasia present; recommend f/u w/ more entailed assessment as indicated. No gross Motor Speech deficits noted. Mild Left decreased labial tone and sensation noted during oral intake. Lingual strength/ROM appeared adequate to aid  oral clearing w/ bolus residue during intake. ?Pt appears to have lived independently prior to hospitalization; again unsure of his employment/work status. Unsure of his motivation to improve his functioning d/t his decreased insight/awareness of deficits; this could improve w/ time and support/guidance. He could benefit from skilled ST services during CIR if deemed an appropriate candidate. Certainly will need 24/7 Supervision at discharge d/t Cognitive deficits as noted above and during PT evaluation(impulsiveness, high fall risk).  ?SLP Recommendation/Assessment: Patient needs continued Speech Lanaguage Pathology Services at Discharge.  ? ?BSE:  Pt appears to present w/ adequate pharyngeal phase swallow w/ No pharyngeal phase dysphagia noted. Pt consumed po trials w/ No overt, clinical s/s of aspiration during po trials. However, oral phase deficits noted c/b decreased Left oral/labial weakness and decreased sensation/awareness of pocketed foods. W/ cues, he was able to use lingual/finger sweeping and alternate sips of liquids to aid oral clearing. ?Pt appears at reduced risk for aspiration following general aspiration precautions; swallowing strategies as educated on/practiced.  ? ?During po trials, pt consumed all consistencies w/ no overt coughing, decline in vocal quality, or change in respiratory presentation during/post trials. O2 sats remained 98%. Oral phase appeared [facility] w/ timely bolus management and control of bolus propulsion for A-P transfer for swallowing w/ liquids and purees. W/ increased textures foods, pt exhibited increased mastication/mashing (missing most back dentition/molars), then pocketing and residue in Left cheek w/ min labial leakage and residue in Left corner of mouth. Oral clearing aided/achieved w/ solid food consistencies using strategies: lingual/finger sweeping; alternating foods/liquids AND verbal cues, Time. Pt was Not fully sensate to the Left oral residue and pocketing, so  follow through w/ inconsistent w/out MOD cues.   ?OM Exam revealed adequate lingual strength/ROM, but Left labial decreased tone/strength/sensation. Speech adequate/clear though min low volume.  ?Pt fed self w/ setup support.  ? ?Recommend a more mech soft consistency diet w/ Minced meats, moistened foods; Thin liquids VIA CUP for increased oral control - pt does not use straws at home often. Recommend general aspiration precautions, swallow strategies as above. Pills WHOLE in Puree for safer, easier swallowing if any difficulty noted w/ thin liquids/Pills. Education given on Pills in Puree; food consistencies and easy to eat options; general aspiration precautions; swallow strategies of Left oral clearing. NSG updated/agreed. ST services will monitor for any further needs while admitted.    ?SLP Assessment ? SLP Recommendation/Assessment: All further Speech Lanaguage Pathology  needs can be addressed in the next venue of care ?SLP Visit Diagnosis: Dysphagia, oral phase (R13.11);Cognitive communication deficit (R41.841)  ?  ?Recommendations for follow up therapy are one component of a multi-disciplinary discharge planning process, led by the attending physician.  Recommendations may be updated based on patient status, additional functional criteria and insurance authorization. ?   ?Follow Up Recommendations ? Acute inpatient rehab (3hours/day) (TBD)  ?  ?Assistance Recommended at Discharge ? Frequent or constant Supervision/Assistance  ?Functional Status Assessment Patient has had a recent decline in their functional status and demonstrates the ability to make significant improvements in function in a reasonable and predictable amount of time. (suspect dependent on ETOH use/abuse)  ?Frequency and  Duration  TBD at next venue of care ?  ?  ?   ?SLP Evaluation ?Cognition ? Overall Cognitive Status: No family/caregiver present to determine baseline cognitive functioning (unsure of stability of Cognition at baseline d/t  the ETOH abuse/use) ?Arousal/Alertness: Awake/alert ?Orientation Level: Oriented to person;Oriented to place;Oriented to time (some situation) ?Year: 2023 (23) ?Month: March ?Day of Week: Correct ?[place]

## 2021-05-07 ENCOUNTER — Encounter: Payer: Self-pay | Admitting: Internal Medicine

## 2021-05-07 DIAGNOSIS — S065XAA Traumatic subdural hemorrhage with loss of consciousness status unknown, initial encounter: Secondary | ICD-10-CM

## 2021-05-07 LAB — CBC
HCT: 28.4 % — ABNORMAL LOW (ref 39.0–52.0)
Hemoglobin: 9.4 g/dL — ABNORMAL LOW (ref 13.0–17.0)
MCH: 33.2 pg (ref 26.0–34.0)
MCHC: 33.1 g/dL (ref 30.0–36.0)
MCV: 100.4 fL — ABNORMAL HIGH (ref 80.0–100.0)
Platelets: 142 10*3/uL — ABNORMAL LOW (ref 150–400)
RBC: 2.83 MIL/uL — ABNORMAL LOW (ref 4.22–5.81)
RDW: 13.9 % (ref 11.5–15.5)
WBC: 3.2 10*3/uL — ABNORMAL LOW (ref 4.0–10.5)
nRBC: 0 % (ref 0.0–0.2)

## 2021-05-07 LAB — BASIC METABOLIC PANEL
Anion gap: 7 (ref 5–15)
BUN: 10 mg/dL (ref 8–23)
CO2: 27 mmol/L (ref 22–32)
Calcium: 8.4 mg/dL — ABNORMAL LOW (ref 8.9–10.3)
Chloride: 104 mmol/L (ref 98–111)
Creatinine, Ser: 0.83 mg/dL (ref 0.61–1.24)
GFR, Estimated: >60 mL/min (ref >60)
Glucose, Bld: 109 mg/dL — ABNORMAL HIGH (ref 70–99)
Potassium: 3.5 mmol/L (ref 3.5–5.1)
Sodium: 138 mmol/L (ref 135–145)

## 2021-05-07 LAB — GLUCOSE, CAPILLARY
Glucose-Capillary: 101 mg/dL — ABNORMAL HIGH (ref 70–99)
Glucose-Capillary: 104 mg/dL — ABNORMAL HIGH (ref 70–99)
Glucose-Capillary: 110 mg/dL — ABNORMAL HIGH (ref 70–99)
Glucose-Capillary: 129 mg/dL — ABNORMAL HIGH (ref 70–99)
Glucose-Capillary: 156 mg/dL — ABNORMAL HIGH (ref 70–99)
Glucose-Capillary: 203 mg/dL — ABNORMAL HIGH (ref 70–99)

## 2021-05-07 LAB — PHOSPHORUS: Phosphorus: 3.2 mg/dL (ref 2.5–4.6)

## 2021-05-07 LAB — MAGNESIUM: Magnesium: 2 mg/dL (ref 1.7–2.4)

## 2021-05-07 MED ORDER — PROSOURCE PLUS PO LIQD
30.0000 mL | Freq: Three times a day (TID) | ORAL | Status: DC
  Administered 2021-05-08 – 2021-05-12 (×7): 30 mL via ORAL
  Filled 2021-05-07 (×20): qty 30

## 2021-05-07 MED ORDER — POTASSIUM CHLORIDE 20 MEQ PO PACK
40.0000 meq | PACK | Freq: Once | ORAL | Status: AC
  Administered 2021-05-07: 40 meq via ORAL
  Filled 2021-05-07: qty 2

## 2021-05-07 MED ORDER — THIAMINE HCL 100 MG PO TABS
250.0000 mg | ORAL_TABLET | Freq: Every day | ORAL | Status: AC
  Administered 2021-05-08 – 2021-05-12 (×5): 250 mg via ORAL
  Filled 2021-05-07 (×5): qty 3

## 2021-05-07 NOTE — Consult Note (Signed)
PHARMACY CONSULT NOTE - FOLLOW UP ? ?Pharmacy Consult for Electrolyte Monitoring and Replacement  ? ?Recent Labs: ?Potassium (mmol/L)  ?Date Value  ?05/07/2021 3.5  ? ?Magnesium (mg/dL)  ?Date Value  ?05/07/2021 2.0  ? ?Calcium (mg/dL)  ?Date Value  ?05/07/2021 8.4 (L)  ? ?Albumin (g/dL)  ?Date Value  ?05/04/2021 3.1 (L)  ?03/03/2021 3.0 (L)  ? ?Phosphorus (mg/dL)  ?Date Value  ?05/07/2021 3.2  ? ?Sodium (mmol/L)  ?Date Value  ?05/07/2021 138  ?03/03/2021 145 (H)  ? ?Corrected Calcium: 9.12 mg/dL ? ?Assessment: ?66yo M presents to ED with complaint of increased falls last couple of months, most recent being two days ago. PMH includes atrial fibrillation and HFrEF. Patient also reports increased alcohol intake. 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 Pharmacy consulted for electrolyte monitoring and replacement.  ? ?Replaced phosphorus 3/28. ? ?Goal of Therapy:  ?Electrolytes WNL ?Goal K 4.0  ? ?Plan:  ?K 3.5. Likely secondary to malnutrition. Will give Kcl 40 mEq x 1.  ?Will recheck electrolytes with AM labs ? ?Jaynie Bream, PharmD ?Pharmacy Resident  ?05/07/2021 ?7:49 AM' ?

## 2021-05-07 NOTE — Progress Notes (Signed)
Inpatient Rehab Admissions Coordinator:  ? ?I spoke with Charles Schmitt. And his daughter in law regarding potential CIR admit and Charles Schmitt.'s rehab options. Charles Schmitt. Remains confused, asking to go home, and does not demonstrate insight into deficits that would prevent him from going home safely.  Charles Schmitt.'s DIL states that she and her husband (Charles Schmitt.'s son) are patient's only family and that they both work and cannot care for Charles Schmitt. Following discharge from the hospital. Charles Schmitt. Does have a live in girlfriend, Corrie Dandy, who is able to provide supervision only for this Charles Schmitt due to her own physical limitations.  At this time, based on recent Charles Schmitt/OT notes, I do not think it is reasonable that Charles Schmitt. Will be able to progress to supervision level during a brief (~2-3 week) CIR stay in order to d/c home with [facility].We will not pursue CIR admission at this time; however, [facility] team  will follow for progress and participation with therapies and if it appears possible that Charles Schmitt. Could achieve supervision level, then we will pursue admission.  ? ?Megan Salon, MS, CCC-SLP ?Rehab Admissions Coordinator  ?[phone] (celll) ?[phone] (office) ? ?

## 2021-05-07 NOTE — Progress Notes (Signed)
Inpatient Rehab Admissions Coordinator:  ? ?I have not been able to reach patient via phone to discuss potential CIR admit. I will be by this PM to see him in person.  ? ?Clemens Catholic, MS, CCC-SLP ?Rehab Admissions Coordinator  ?[phone] (celll) ?[phone] (office) ? ?

## 2021-05-07 NOTE — Progress Notes (Addendum)
? ?   Attending Progress Note ? ?History: Charles Schmitt. is here for subdural hematoma and alcohol abuse ? ?HD5: NAEO. Pt denies complaints this morning ? ?HD4: NAEO ? ?HD2: No complaints ? ?History of Present Illness: ?05/03/2021 ?Charles Schmitt. is a 66 y.o. male who presents with the chief complaint of multiple falls due to alcohol intoxication.  EMS was called due to a change in status.  He was not very mobile in ER, and had multiple lacerations on his face and head of different ages, so HCT was performed.  This showed an acute SDH. ?  ?The patient has since sobered, though he is not completely sober, and is able to answer questions.  He has no HA, N, or V.  He denies weakness.  He cannot provide further history. ?  ?He reports he had a couple of shots of vodka today.  His family reports his alcohol intake is significant, potentially a half gallon a day. ?  ? ?Physical Exam: ?Vitals:  ? 05/07/21 0707 05/07/21 0800  ?BP: 110/84 107/87  ?Pulse: (!) 25   ?Resp: 19 17  ?Temp:    ?SpO2: 95% 95%  ? ? ?Sitting up in bed eating breakfast. Oriented to person, place and year ?CNI ?No drift ?MAEW ? ?Data: ? ?Recent Labs  ?Lab 05/05/21 ?0223 05/06/21 ?0419 05/07/21 ?0559  ?NA 142 138 138  ?K 4.2 3.3* 3.5  ?CL 101 101 104  ?CO2 31 29 27   ?BUN 9 9 10   ?CREATININE 0.92 0.96 0.83  ?GLUCOSE 146* 93 109*  ?CALCIUM 8.7* 8.4* 8.4*  ? ? ?Recent Labs  ?Lab 05/04/21 ?0506  ?AST 44*  ?ALT 22  ?ALKPHOS 110  ? ?  ? Recent Labs  ?Lab 05/03/21 ?1937 05/04/21 ?05/05/21 05/05/21 ?7253  ?WBC 4.8 4.1 2.0*  ?HGB 11.9* 9.8* 10.4*  ?HCT 35.2* 28.6* 30.8*  ?PLT 115* 106* 102*  ? ? ?Recent Labs  ?Lab 05/03/21 ?1937 05/04/21 ?0724  ?INR 1.0 1.1  ? ?  ?   ? ? ?Other tests/results: Repeat Hct overnight stable ? ?Assessment/Plan: ? ?05/05/21. Is stable from acute R subdural hematoma ? ?- Continue to monitor for DT's and alcohol withdrawal per medicine team ?- head CT 3/28 was stable ?-  q4 hr neuro checks ?- PTOT  ?- please call with any  questions or concerns. ? ?[name] PA-C ?Department of Neurosurgery ? ? ? ?

## 2021-05-07 NOTE — Progress Notes (Signed)
Nutrition Follow-up ? ?DOCUMENTATION CODES:  ? ?Underweight, Severe malnutrition in context of chronic illness ? ?INTERVENTION:  ? ?-Continue MVI with minerals daily ?-Continue Ensure Enlive po TID, each supplement provides 350 kcal and 20 grams of protein ?-30 ml Prosource Plus TID, each supplement provides 100 kcals and 15 grams protein ? ?NUTRITION DIAGNOSIS:  ? ?Severe Malnutrition related to chronic illness (ETOH abuse) as evidenced by moderate fat depletion, severe fat depletion, moderate muscle depletion, severe muscle depletion. ? ?Ongoing ? ?GOAL:  ? ?Patient will meet greater than or equal to 90% of their needs ? ?Progressing  ? ?MONITOR:  ? ?PO intake, Supplement acceptance, Labs, Weight trends, Skin, I & O's ? ?REASON FOR ASSESSMENT:  ? ?Consult ?Assessment of nutrition requirement/status ? ?ASSESSMENT:  ? ?Charles Schmitt is here with traumatic SAH/SDH/IPH after fall from standing height in the setting of acute alcohol intoxication. CT head on admission revealed 4 mm right to left midline shift. Repeat CT head showed stable findings. He is a GCS of 15. 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 ? ?3/28- s/p BSE- downgraded to dysphagia 3 diet  ? ?Reviewed I/O's: -400 ml x 24 hours and -248 since admission ? ?UOP: 450 ml x 24 hours ? ?Pt sitting up in bed at time of visit. Pt confused, stating he needs to use the commode. RD attempted to reorient pt to situation.  ? ?Pt consuming about 50% of meals. Noted Ensure drink was open on tray table, however, pt only took a sip or tow out of it.  ? ?Palliative care following for goals of care discussions.   ? ?Medications reviewed and include colace, folic acid, miralax, and thiamine.  ? ?Labs reviewed: CBGS: 101-140 (inpatient orders for glycemic control are none).   ? ?Diet Order:   ?Diet Order   ? ?       ?  DIET DYS 3 Room service appropriate? Yes  with Assist; Fluid consistency: Thin  Diet effective now       ?  ? ?  ?  ? ?  ? ? ?EDUCATION NEEDS:  ? ?Education needs have been addressed ? ?Skin:  Skin Assessment: Skin Integrity Issues: ?Skin Integrity Issues:: Other (Comment) ?Other: healed pressure injury to sacrum ? ?Last BM:  05/02/21 ? ?Height:  ? ?Ht Readings from Last 1 Encounters:  ?05/03/21 6\' 5"  (1.956 m)  ? ? ?Weight:  ? ?Wt Readings from Last 1 Encounters:  ?05/07/21 62.3 kg  ? ? ?Ideal Body Weight:  94.5 kg ? ?BMI:  Body mass index is 16.29 kg/m?. ? ?Estimated Nutritional Needs:  ? ?Kcal:  2300-2500 ? ?Protein:  125-150 grams ? ?Fluid:  > 2 L ? ? ? ?05/09/21, RD, LDN, CDCES ?Registered Dietitian II ?Certified Diabetes Care and Education Specialist ?Please refer to [facility] for RD and/or RD on-call/weekend/after hours pager  ?

## 2021-05-07 NOTE — Progress Notes (Addendum)
Speech Language Pathology Treatment: Dysphagia  ?Patient Details ?Name: Charles Schmitt. ?MRN: [id number] ?DOB: 01-16-1956 ?Today's Date: 05/07/2021 ?Time: 3149-7026 ?SLP Time Calculation (min) (ACUTE ONLY): 45 min ? ?Assessment / Plan / Recommendation ?Clinical Impression ? Pt seen for ongoing assessment of swallowing, toleration of diet, and follow through w/ swallowing strategies as instructed and recommended d/t mild Oral phase dysphagia/deficits. Pt alert, talkative w/ some tangential thoughts/presentation. He followed directions readily given min verbal/tactile cues.  ?Pt appears to present w/ adequate pharyngeal phase swallow w/ No pharyngeal phase dysphagia noted. Pt consumed po trials w/ No overt, clinical s/s of aspiration during po trials. Mild Oral phase deficits noted c/b decreased Left oral/labial weakness and decreased sensation/awareness of food residue. W/ cues, he was able to use lingual sweeping and alternate sips of liquids as strategies to aid oral clearing. ?Pt appears at reduced risk for aspiration following general aspiration precautions; swallowing strategies as educated on/practiced.  ?  ?During po trials, pt consumed all consistencies w/ no overt coughing, decline in vocal quality, or change in respiratory presentation during/post trials. O2 sats remained 98-99%. Oral phase appeared [facility] w/ timely bolus management and control of bolus propulsion for A-P transfer for swallowing w/ liquids and purees. W/ increased textures foods, pt exhibited increased mastication/mashing (missing most back dentition/molars), then pocketing and residue in Left anterior cheek/buccal area w/ min residue in Left corner of mouth. This appeared somewhat less than at evaluation yesterday. Oral clearing aided/achieved w/ soft solid food consistencies during the Lunch meal using strategies: lingual sweeping, alternating foods/liquids, AND verbal cues, Time given. Pt was Not fully sensate to the Left oral residue, so  follow through w/ inconsistent w/out MIN cues at this session. NSG reported noting similar Left oral residue and cues needed to clear.   ?Pt fed self w/ setup support.  ?  ?Recommend a more mech soft consistency diet w/ Minced meats, moistened foods; Thin liquids VIA CUP for increased oral control - pt does not use straws at home often. Recommend general aspiration precautions, swallow strategies as above. Pills WHOLE vs CRUSHED in Puree for safer, easier swallowing if any difficulty noted w/ thin liquids/Pills. Education given on Pills in Puree; food consistencies and easy to eat options; general aspiration precautions; swallow strategies of Left oral clearing. NSG updated/agreed. ST services will monitor for any further swallowing needs while admitted. Suspect pt can receive f/u at next venue of care for follow through w/ following directions and using swallowing strategies in hopes to require less Supervision during oral intake. ? ? 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 Upon talking w/ the listed Relative in the chart, she confirmed that the pt has NOT farmed nor taken care of farm animals/farm. She is not sure why he would talk about such. The pt did work in a factory in the area until ~2 years ago. Unsure of pt's Baseline Cognitive status in setting of ETOH abuse/use as per chart notes. There is also the impact of the new SDH. Pt is currently A/Ox3; follows commands; and is able to identify certain events ongoing in the news today. Recommend f/u w/ cognitive-communication therapy at discharge in order to improve functioning in ADLs.  ? ? ? ?  ?HPI HPI: Pt is a [name] presenting to [facility] ED from home via EMS after heavy drinking these past 2 weeks and multiple falls at home. The patient is unsure why he needed to come  to the hospital, but does admit he fell out of bed and had difficulty getting up this morning because of weakness in his legs. Several  CT scans showing traumatic SDH, SAH, and midline shift.  He reports he had a couple of shots of vodka the day of admit.  His family reports his alcohol intake is significant, potentially a half gallon a day.  Recent report  that he has been vomiting and not eating any solid food for the past 3 weeks or so.  Head CTs 05/03/2021-Today at this evaluation: Unchanged right subdural hematoma with mass effect and 4 mm of  right-to-left midline shift.  2. Unchanged subarachnoid blood in the right sylvian fissure and  right superior frontal lobe hemorrhagic contusions.  3. Unchanged small volume of subdural hemorrhage over the vertex  left frontal lobe, without significant mass effect. ?  ?   ?SLP Plan ? All goals met for current setting.  ? ?  ?  ?Recommendations for follow up therapy are one component of a multi-disciplinary discharge planning process, led by the attending physician.  Recommendations may be updated based on patient status, additional functional criteria and insurance authorization. ?  ? ?Recommendations  ?Diet recommendations: Dysphagia 3 (mechanical soft);Thin liquid (meats MINCED w/ gravies) ?Liquids provided via: Cup;No straw ?Medication Administration: Crushed with puree (for safer swallowing) ?Supervision: Patient able to self feed;Intermittent supervision to cue for compensatory strategies (cues for following strategies) ?Compensations: Minimize environmental distractions;Slow rate;Small sips/bites;Lingual sweep for clearance of pocketing;Follow solids with liquid ?Postural Changes and/or Swallow Maneuvers: Out of bed for meals;Seated upright 90 degrees;Upright 30-60 min after meal  ?   ?    ?   ? ? ? ? General recommendations:  (Dietician f/u) ?Oral Care Recommendations: Oral care BID;Oral care before and after PO;Staff/trained caregiver to provide oral care ?Follow Up Recommendations: Acute inpatient rehab (3hours/day) (TBD; for cognitive-communication needs) ?Assistance recommended at discharge:  Intermittent Supervision/Assistance ?SLP Visit Diagnosis: Dysphagia, oral phase (R13.11) ?Plan: All goals met ? ? ? ? ?  ?  ? ? ? ? ? ?Orinda Kenner, MS, CCC-SLP ?Speech Language Pathologist ?Rehab Services; [place] ?[phone] (ascom) ?Charles Schmitt ? ?05/07/2021, 1:26 PM ?

## 2021-05-07 NOTE — Progress Notes (Signed)
Physical Therapy Treatment ?Patient Details ?Name: Charles Schmitt. ?MRN: [id number] ?DOB: 10/29/1955 ?Today's Date: 05/07/2021 ? ? ?History of Present Illness [name] presenting to [facility] ED from home via EMS after heavy drinking these past 2 weeks and multiple falls at home. The patient is unsure why he needed to come to the hospital, but does admit he fell out of bed and had difficulty getting up this morning because of weakness in his legs. Several CT scans showing traumatic SDH, SAH, and midline shift. ? ?  ?PT Comments  ? ? Pt is making limited progress towards goals with no carry over of previous session. Still presents with L LE weakness and has poor safety awareness of deficits. Able to sit at EOB and eat ice cream with this therapist and perform seated/standing exercises. Plan to trial lite gait next session.   ?Recommendations for follow up therapy are one component of a multi-disciplinary discharge planning process, led by the attending physician.  Recommendations may be updated based on patient status, additional functional criteria and insurance authorization. ? ?Follow Up Recommendations ? Skilled nursing-short term rehab (<3 hours/day) ?  ?  ?Assistance Recommended at Discharge Frequent or constant Supervision/Assistance  ?Patient can return home with the following Two people to help with walking and/or transfers;A lot of help with bathing/dressing/bathroom;Direct supervision/assist for medications management;Direct supervision/assist for financial management;Assist for transportation;Help with stairs or ramp for entrance ?  ?Equipment Recommendations ?    ?  ?Recommendations for Other Services   ? ? ?  ?Precautions / Restrictions Precautions ?Precautions: Fall ?Restrictions ?Weight Bearing Restrictions: No  ?  ? ?Mobility ? Bed Mobility ?Overal bed mobility: Needs Assistance ?Bed Mobility: Supine to Sit, Sit to Supine ?  ?  ?Supine to sit: Mod assist ?Sit to supine: Mod assist ?  ?General bed mobility  comments: performed mobility and able to exit bed towards R side. L leg drags behind and needs to use B hands to move leg and assist from therapist. Once seated, able to sit with cga with periods of supervision. ?  ? ?Transfers ?Overall transfer level: Needs assistance ?Equipment used: Rolling walker (2 wheels) ?Transfers: Sit to/from Stand ?Sit to Stand: Mod assist ?  ?  ?  ?  ?  ?General transfer comment: several standing attempts with cues for sequencing. Once standing, upright posture noted. RW adjusted to correct height. ?  ? ?Ambulation/Gait ?  ?  ?  ?  ?  ?  ?Pre-gait activities: standing pregait activities performed including weight shifts, toe raises, alt marching. Manual assist (max) for L leg placement due to weakness. INcreased B UE tremors noted during activity. ?  ? ? ?Stairs ?  ?  ?  ?  ?  ? ? ?Wheelchair Mobility ?  ? ?Modified Rankin (Stroke Patients Only) ?  ? ? ?  ?Balance Overall balance assessment: Needs assistance ?Sitting-balance support: Feet supported, Single extremity supported ?Sitting balance-Leahy Scale: Fair ?  ?  ?  ?  ?  ?  ?  ?  ?  ?  ?  ?  ?  ?  ?  ?  ?  ? ?  ?Cognition Arousal/Alertness: Awake/alert ?Behavior During Therapy: Impulsive, Flat affect ?Overall Cognitive Status: No family/caregiver present to determine baseline cognitive functioning ?Area of Impairment: Rancho level ?  ?  ?  ?  ?  ?  ?  ?Rancho Levels of Cognitive Functioning ?Rancho Mirant Scales of Cognitive Functioning: Confused/inappropriate/non-agitated ?  ?  ?  ?  ?  ?  ?  ?  General Comments: poor safety awareness and judgement. Impulsive ?  ?Rancho Mirant Scales of Cognitive Functioning: Confused/inappropriate/non-agitated ? ?  ?Exercises Other Exercises ?Other Exercises: seated ther-ex performed including B elbow taps on pillows, LAQ, SLRs, and hip abd/add. 10 reps performed. ?Other Exercises: pt on bed pan upon arrival, unable to sequencing how to roll to get off bed pan without cues. Rolling performed  with supervision. ? ?  ?General Comments   ?  ?  ? ?Pertinent Vitals/Pain Pain Assessment ?Pain Assessment: Faces ?Faces Pain Scale: Hurts little more ?Pain Location: L hip ?Pain Descriptors / Indicators: Aching ?Pain Intervention(s): Limited activity within patient's tolerance  ? ? ?Home Living Family/patient expects to be discharged to:: Private residence ?  ?  ?  ?  ?  ?  ?  ?  ?  ?   ?  ?Prior Function    ?  ?  ?   ? ?PT Goals (current goals can now be found in the care plan section) Acute Rehab PT Goals ?Patient Stated Goal: to go home ?PT Goal Formulation: With patient ?Time For Goal Achievement: 05/19/21 ?Potential to Achieve Goals: Good ?Progress towards PT goals: Progressing toward goals ? ?  ?Frequency ? ? ? Min 2X/week ? ? ? ?  ?PT Plan Discharge plan needs to be updated  ? ? ?Co-evaluation   ?  ?  ?  ?  ? ?  ?AM-PAC PT "6 Clicks" Mobility   ?Outcome Measure ? Help needed turning from your back to your side while in a flat bed without using bedrails?: A Little ?Help needed moving from lying on your back to sitting on the side of a flat bed without using bedrails?: A Lot ?Help needed moving to and from a bed to a chair (including a wheelchair)?: A Lot ?Help needed standing up from a chair using your arms (e.g., wheelchair or bedside chair)?: A Lot ?Help needed to walk in hospital room?: Total ?Help needed climbing 3-5 steps with a railing? : Total ?6 Click Score: 11 ? ?  ?End of Session Equipment Utilized During Treatment: Gait belt ?Activity Tolerance: Patient tolerated treatment well ?Patient left: in bed;with bed alarm set ?Nurse Communication: Mobility status ?PT Visit Diagnosis: Unsteadiness on feet (R26.81);Repeated falls (R29.6);Muscle weakness (generalized) (M62.81);History of falling (Z91.81);Difficulty in walking, not elsewhere classified (R26.2) ?  ? ? ?Time: 8546-2703 ?PT Time Calculation (min) (ACUTE ONLY): 30 min ? ?Charges:  $Therapeutic Exercise: 8-22 mins ?$Therapeutic Activity: 8-22  mins          ?          ? ?Elizabeth Palau, PT, DPT, GCS ?[phone] ? ? ? ?Lorrin Bodner ?05/07/2021, 4:34 PM ? ?

## 2021-05-07 NOTE — Progress Notes (Signed)
?                                                   ?Palliative Care Progress Note, Assessment & Plan  ? ?Patient Name: Charles Schmitt.       Date: 05/07/2021 ?DOB: 1955-06-08  Age: 66 y.o. MRN#: [id number] ?Attending Physician: Jennye Boroughs, MD ?Primary Care Physician: Pcp, No ?Admit Date: 05/03/2021 ? ?Reason for Consultation/Follow-up: Establishing goals of care ? ?Subjective: ?Patient is sitting in bed in no apparent distress.  Respirations are even and unlabored.  Patient is on the phone and acknowledges my presence as I enter his room.  He is able to make his wishes known.  No family/friends at bedside. ? ?HPI: ?66 y.o. male  with past medical history of etoh abuse, chronic pAF (no AC), cardiomyopathy, HTN, protein calorie malnutrition admitted on 05/03/2021 s/p fall at home with subarachnoid hemorrhage. Serial CTs reveals 54m midline shift with no neurosurgical intervention warranted. Neuro cleared pt for PT/OT and DVT prophylaxis. Pt is on librium taper for alcohol withdrawal with CIWAs = 0-1. He is being evaluated for CIR.     ? ?Summary of counseling/coordination of care: ?After reviewing the patient's chart and assessing the patient at bedside, I met with patient to discuss diagnosis, prognosis, and goals of care.  Patient does not recall meeting me yesterday.  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 ? ?Patient is alert to self, DOB, year, [place], and why he is in the hospital.  I again attempted to elicit goals important to the patient.  He shares he wants to be able to go home and continue to ride his horses. ? ?After some redirection, patient was able to recall that he fell, hit his head, his head has "healed back up", and that he needs to get stronger to go home.  Patient is mildly frustrated that he cannot get out  of bed to use the bedside commode.  I again reiterated that plan is for him to be evaluated for inpatient rehab so that he can want to stay sober and to build strength back to be able to return home to previous level of functioning. ? ?Patient was in agreement for rehab but continued to hyperfocus on knowing when he would get out of the hospital and that he will use bedside commode. ? ?Goals are clear and set. Full code/full scope with plan for CIR evaluation.  ? ?Palliative medicine team will shadow the patient's chart. If patient's health significantly declines, goals are changed, or palliative needs arise, please reach out via Amion to on service providers. ? ?Code Status: ?Full code ? ?Prognosis: ?Unable to determine ? ?Discharge Planning: ?To Be Determined ? ?Care plan was discussed with nursing, patient ? ?Physical Exam ?Vitals reviewed.  ?Constitutional:   ?   General: He is not in acute distress. ?   Appearance: He is not toxic-appearing.  ?HENT:  ?   Head: Normocephalic and atraumatic.  ?   Mouth/Throat:  ?   Comments: Poor dentition ?Cardiovascular:  ?   Rate and Rhythm: Normal rate.  ?   Pulses: Normal pulses.  ?Pulmonary:  ?   Effort: Pulmonary effort is normal.  ?Musculoskeletal:  ?   Comments: Generalized weakness  ?Skin: ?   General: Skin is  warm.  ?Neurological:  ?   Mental Status: He is alert and oriented to person, place, and time.  ?Psychiatric:     ?   Mood and Affect: Mood normal.     ?   Behavior: Behavior normal.  ?   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         ? ?Palliative Assessment/Data: 50% ? ? ? ?Total Time 25 minutes  ?Greater than 50%  of this time was spent counseling and coordinating care related to the above assessment and plan. ? ?Thank you for allowing the Palliative Medicine Team to assist in the care of this patient. ? ?Verdell Carmine. , DNP, FNP-BC ?Palliative Medicine Team ?Team Phone # [phone] ?  ?

## 2021-05-07 NOTE — Progress Notes (Addendum)
Progress Note    Charles Schmitt.  ZOX:[id number] DOB: 1955-10-27  DOA: 05/03/2021 PCP: Pcp, No      Brief Narrative:    Medical records reviewed and are as summarized below:  Charles Schmitt. is a 66 y.o. male who was brought to the hospital because of multiple falls due to alcohol intoxication.  He was found to have acute subdural hematoma.       Assessment/Plan:   Principal Problem:   SAH (subarachnoid hemorrhage) (HCC) Active Problems:   Subdural hematoma   Nutrition Problem: Severe Malnutrition Etiology: chronic illness (ETOH abuse)  Signs/Symptoms: moderate fat depletion, severe fat depletion, moderate muscle depletion, severe muscle depletion   Body mass index is 16.29 kg/m.  Acute traumatic left subdural hematoma, bilateral subarachnoid hemorrhage, intraparenchymal hemorrhagic contusion, multiple falls: Neurosurgeon recommended conservative management.  PT and OT recommended discharge to SNF.  Continue fall precautions.   Alcohol use disorder Use benzodiazepines as needed per CIWA protocol for alcohol withdrawal syndrome   Hypokalemia, hypoglycemia Resolved   Mildly elevated liver enzymes may be from alcohol use disorder  Other comorbidities include chronic diastolic CHF, paroxysmal atrial fibrillation, ischemic cardiomyopathy, hypertension  Diet Order             DIET DYS 3 Room service appropriate? Yes with Assist; Fluid consistency: Thin  Diet effective now                    Consultants: Neurosurgeon  Procedures: None    Medications:    (feeding supplement) PROSource Plus  30 mL Oral TID BM   chlordiazePOXIDE  25 mg Oral TID   Followed by   Melene Muller ON 05/08/2021] chlordiazePOXIDE  25 mg Oral BH-qamhs   Followed by   Melene Muller ON 05/10/2021] chlordiazePOXIDE  25 mg Oral Daily   Chlorhexidine Gluconate Cloth  6 each Topical Daily   docusate sodium  100 mg Oral BID   feeding supplement  237 mL Oral TID BM   folic acid  1  mg Oral Daily   lidocaine  1 patch Transdermal Q24H   metoprolol succinate  12.5 mg Oral Daily   multivitamin with minerals  1 tablet Oral Daily   polyethylene glycol  17 g Oral Daily   [START ON 05/12/2021] thiamine injection  100 mg Intravenous Daily   Or   [START ON 05/12/2021] thiamine  100 mg Oral Daily   [START ON 05/08/2021] thiamine  250 mg Oral Daily   Continuous Infusions:   Anti-infectives (From admission, onward)    None              Family Communication/Anticipated D/C date and plan/Code Status   DVT prophylaxis: SCDs Start: 05/03/21 1912     Code Status: Full Code  Family Communication: None Disposition Plan: Plan to discharge to SNF   Status is: Inpatient Remains inpatient appropriate because: Unsafe discharge, awaiting placement to SNF       Subjective:   Interval events noted.  He is confused and unable to provide an adequate history.  He said he wants to go home.  Objective:    Vitals:   05/07/21 0400 05/07/21 0420 05/07/21 0707 05/07/21 0800  BP: 114/90  110/84 107/87  Pulse: 68  (!) 25   Resp: 20  19 17   Temp:      TempSrc:      SpO2: 96%  95% 95%  Weight:  62.3 kg    Height:  No data found.   Intake/Output Summary (Last 24 hours) at 05/07/2021 1207 Last data filed at 05/07/2021 0800 Gross per 24 hour  Intake 60 ml  Output 450 ml  Net -390 ml   Filed Weights   05/05/21 0423 05/06/21 0401 05/07/21 0420  Weight: 62.5 kg 61.2 kg 62.3 kg    Exam:  GEN: NAD SKIN: No rash EYES: EOMI ENT: MMM CV: RRR PULM: CTA B ABD: soft, ND, NT, +BS CNS: AAO x 2 (person and place), non focal EXT: No edema or tenderness    Pressure Injury 05/03/21 Sacrum healed pressure injury (Active)  05/03/21 2330  Location: Sacrum  Location Orientation:   Staging:   Wound Description (Comments): healed pressure injury  Present on Admission: Yes  Dressing Type Foam - Lift dressing to assess site every shift 05/07/21 0700     Data  Reviewed:   I have personally reviewed following labs and imaging studies:  Labs: Labs show the following:   Basic Metabolic Panel: Recent Labs  Lab 05/04/21 0506 05/04/21 1210 05/05/21 0223 05/06/21 0419 05/07/21 0559  NA 142 137 142 138 138  K 3.8 4.0 4.2 3.3* 3.5  CL 102 98 101 101 104  CO2 26 28 31 29 27   GLUCOSE 62* 108* 146* 93 109*  BUN 9 9 9 9 10   CREATININE 0.73 0.84 0.92 0.96 0.83  CALCIUM 8.4* 8.5* 8.7* 8.4* 8.4*  MG 2.6* 2.2 1.8 2.1 2.0  PHOS 3.4 2.7 3.1 2.3* 3.2   GFR Estimated Creatinine Clearance: 80.3 mL/min (by C-G formula based on SCr of 0.83 mg/dL). Liver Function Tests: Recent Labs  Lab 05/03/21 1937 05/04/21 0506  AST 47* 44*  ALT 24 22  ALKPHOS 127* 110  BILITOT 1.1 1.2  PROT 7.6 6.5  ALBUMIN 3.3* 3.1*   No results for input(s): LIPASE, AMYLASE in the last 168 hours. No results for input(s): AMMONIA in the last 168 hours. Coagulation profile Recent Labs  Lab 05/03/21 1937 05/04/21 0724  INR 1.0 1.1    CBC: Recent Labs  Lab 05/03/21 1937 05/04/21 0506 05/05/21 0223  WBC 4.8 4.1 2.0*  HGB 11.9* 9.8* 10.4*  HCT 35.2* 28.6* 30.8*  MCV 98.1 98.3 98.1  PLT 115* 106* 102*   Cardiac Enzymes: No results for input(s): CKTOTAL, CKMB, CKMBINDEX, TROPONINI in the last 168 hours. BNP (last 3 results) No results for input(s): PROBNP in the last 8760 hours. CBG: Recent Labs  Lab 05/06/21 1932 05/06/21 2354 05/07/21 0400 05/07/21 0804 05/07/21 1104  GLUCAP 137* 140* 110* 101* 104*   D-Dimer: No results for input(s): DDIMER in the last 72 hours. Hgb A1c: No results for input(s): HGBA1C in the last 72 hours. Lipid Profile: No results for input(s): CHOL, HDL, LDLCALC, TRIG, CHOLHDL, LDLDIRECT in the last 72 hours. Thyroid function studies: No results for input(s): TSH, T4TOTAL, T3FREE, THYROIDAB in the last 72 hours.  Invalid input(s): FREET3 Anemia work up: No results for input(s): VITAMINB12, FOLATE, FERRITIN, TIBC, IRON,  RETICCTPCT in the last 72 hours. Sepsis Labs: Recent Labs  Lab 05/03/21 1937 05/04/21 0506 05/05/21 0223  WBC 4.8 4.1 2.0*    Microbiology Recent Results (from the past 240 hour(s))  Resp Panel by RT-PCR (Flu A&B, Covid) Nasopharyngeal Swab     Status: None   Collection Time: 05/03/21  7:37 PM   Specimen: Nasopharyngeal Swab; Nasopharyngeal(NP) swabs in vial transport medium  Result Value Ref Range Status   SARS Coronavirus 2 by RT PCR NEGATIVE NEGATIVE Final  Comment: (NOTE) SARS-CoV-2 target nucleic acids are NOT DETECTED.  The SARS-CoV-2 RNA is generally detectable in upper respiratory specimens during the acute phase of infection. The lowest concentration of SARS-CoV-2 viral copies this assay can detect is 138 copies/mL. A negative result does not preclude SARS-Cov-2 infection and should not be used as the sole basis for treatment or other patient management decisions. A negative result may occur with  improper specimen collection/handling, submission of specimen other than nasopharyngeal swab, presence of viral mutation(s) within the areas targeted by this assay, and inadequate number of viral copies(<138 copies/mL). A negative result must be combined with clinical observations, patient history, and epidemiological information. The expected result is Negative.  Fact Sheet for Patients:  BloggerCourse.com  Fact Sheet for Healthcare Providers:  SeriousBroker.it  This test is no t yet approved or cleared by the [place] FDA and  has been authorized for detection and/or diagnosis of SARS-CoV-2 by FDA under an Emergency Use Authorization (EUA). This EUA will remain  in effect (meaning this test can be used) for the duration of the COVID-19 declaration under Section 564(b)(1) of the Act, 21 U.S.C.section 360bbb-3(b)(1), unless the authorization is terminated  or revoked sooner.       Influenza A by PCR NEGATIVE  NEGATIVE Final   Influenza B by PCR NEGATIVE NEGATIVE Final    Comment: (NOTE) The Xpert Xpress SARS-CoV-2/FLU/RSV plus assay is intended as an aid in the diagnosis of influenza from Nasopharyngeal swab specimens and should not be used as a sole basis for treatment. Nasal washings and aspirates are unacceptable for Xpert Xpress SARS-CoV-2/FLU/RSV testing.  Fact Sheet for Patients: BloggerCourse.com  Fact Sheet for Healthcare Providers: SeriousBroker.it  This test is not yet approved or cleared by the [place] FDA and has been authorized for detection and/or diagnosis of SARS-CoV-2 by FDA under an Emergency Use Authorization (EUA). This EUA will remain in effect (meaning this test can be used) for the duration of the COVID-19 declaration under Section 564(b)(1) of the Act, 21 U.S.C. section 360bbb-3(b)(1), unless the authorization is terminated or revoked.  Performed at [facility], [address] [place], [place] [address]   MRSA Next Gen by PCR, Nasal     Status: None   Collection Time: 05/03/21  8:51 PM   Specimen: Nasal Mucosa; Nasal Swab  Result Value Ref Range Status   MRSA by PCR Next Gen NOT DETECTED NOT DETECTED Final    Comment: (NOTE) The GeneXpert MRSA Assay (FDA approved for NASAL specimens only), is one component of a comprehensive MRSA colonization surveillance program. It is not intended to diagnose MRSA infection nor to guide or monitor treatment for MRSA infections. Test performance is not FDA approved in patients less than 81 years old. Performed at [facility], [address] [place], [place] [address]     Procedures and diagnostic studies:  CT HEAD WO CONTRAST ( )  Result Date: 05/06/2021 CLINICAL DATA:  Follow-up subarachnoid hemorrhage EXAM: CT HEAD WITHOUT CONTRAST TECHNIQUE: Contiguous axial images were obtained from the base of the skull through the vertex without  intravenous contrast. RADIATION DOSE REDUCTION: This exam was performed according to the departmental dose-optimization program which includes automated exposure control, adjustment of the mA and/or kV according to patient size and/or use of iterative reconstruction technique. COMPARISON:  05/03/2020 FINDINGS: Brain: Redemonstrated subarachnoid hemorrhage in the right sylvian fissure, unchanged. Redemonstrated right cerebral convexity subdural hematoma, which measures approximately 10 mm, unchanged; additional 5 mm subdural hemorrhage more superiorly is also  unchanged. Resulting mass effect and 4 mm of right-to-left midline shift are unchanged when remeasured similarly. Intraparenchymal hemorrhagic contusions in the vertex right frontal lobe are also unchanged, with mild surrounding edema. 5 mm subdural hemorrhage along the vertex left frontal lobe is also unchanged, without significant mass effect. No new area of hemorrhage. No acute infarct, mass, hydrocephalus, or new intraparenchymal or extra-axial hemorrhage. Vascular: No hyperdense vessel. Skull: Normal. Negative for fracture or focal lesion. Sinuses/Orbits: No acute finding. Other: The mastoids are well aerated. IMPRESSION: 1. Unchanged right subdural hematoma with mass effect and 4 mm of right-to-left midline shift. 2. Unchanged subarachnoid blood in the right sylvian fissure and right superior frontal lobe hemorrhagic contusions. 3. Unchanged small volume of subdural hemorrhage over the vertex left frontal lobe, without significant mass effect. Electronically Signed   By: Wiliam Ke M.D.   On: 05/06/2021 12:54               LOS: 4 days   Santiana Glidden  [facility]   Pager on [email or web address]. If 7PM-7AM, please contact night-coverage at [email or web address]     05/07/2021, 12:07 PM

## 2021-05-07 NOTE — Progress Notes (Signed)
Occupational Therapy Treatment ?Patient Details ?Name: Charles Schmitt. ?MRN: [id number] ?DOB: [birth year] ?Today's Date: 05/07/2021 ? ? ?History of present illness [name] presenting to [facility] ED from home via EMS after heavy drinking these past 2 weeks and multiple falls at home. The patient is unsure why he needed to come to the hospital, but does admit he fell out of bed and had difficulty getting up this morning because of weakness in his legs. Several CT scans showing traumatic SDH, SAH, and midline shift. ?  ?OT comments ? Upon entering the room, pt supine in bed and reporting need for BM. Pt performs bed mobility with mod A to EOB. He remains very impulsive and attempts to stand without RW. Pt looks back at [facility] and continues to attempt sitting in floor and being several feet from actual [facility]. Therapist essentially holding pt up and pulling BSC behind him to sit on. Pt urinates on floor and socks. Total A to clean skin and don clean hospital socks while seated. He is able to establish figure four position with assistance from therapist and reporting "stretch". Pt unable to void while on commode. He is insistent that he can transfer/ambulate himself. He stands and is unable to facilitate movement of L LE without total A and max A for balance to return to bed safely.No progress in regards to insight to deficits. Pt also with stories of confabulation and reports he is here because he fell from his horse. Pt remains in bed at end of session with all needs within reach.  ? ?Recommendations for follow up therapy are one component of a multi-disciplinary discharge planning process, led by the attending physician.  Recommendations may be updated based on patient status, additional functional criteria and insurance authorization. ?   ?Follow Up Recommendations ? Skilled nursing-short term rehab (<3 hours/day)  ?  ?Assistance Recommended at Discharge Frequent or constant Supervision/Assistance  ?Patient can return home with  the following ? Two people to help with walking and/or transfers;Two people to help with bathing/dressing/bathroom;Assistance with cooking/housework;Help with stairs or ramp for entrance;Assist for transportation;Direct supervision/assist for medications management;Direct supervision/assist for financial management ?  ?Equipment Recommendations ? Other (comment) (defer to next venue of care)  ?  ?   ?Precautions / Restrictions Precautions ?Precautions: Fall ?Restrictions ?Weight Bearing Restrictions: No  ? ? ?  ? ?Mobility Bed Mobility ?Overal bed mobility: Needs Assistance ?Bed Mobility: Supine to Sit, Sit to Supine ?  ?  ?Supine to sit: Mod assist ?Sit to supine: Mod assist ?  ?General bed mobility comments: very impulsive with all movement and needing cuing for technique. ?  ? ?Transfers ?Overall transfer level: Needs assistance ?Equipment used: Rolling walker (2 wheels) ?Transfers: Bed to chair/wheelchair/BSC ?Sit to Stand: Mod assist ?  ?  ?Step pivot transfers: Max assist ?  ?  ?  ?  ?  ?Balance Overall balance assessment: Needs assistance ?Sitting-balance support: Feet supported, Single extremity supported ?Sitting balance-Leahy Scale: Fair ?  ?  ?Standing balance support: Reliant on assistive device for balance, Bilateral upper extremity supported ?Standing balance-Leahy Scale: Zero ?  ?  ?  ?  ?  ?  ?  ?  ?  ?  ?  ?  ?   ? ?ADL either performed or assessed with clinical judgement  ? ?ADL Overall ADL's : Needs assistance/impaired ?  ?  ?  ?  ?  ?  ?  ?  ?  ?  ?  ?  ?Toilet Transfer:  Maximal assistance;Stand-pivot;Rolling walker (2 wheels);BSC/3in1 ?  ?  ?  ?  ?  ?  ?  ?  ? ?Extremity/Trunk Assessment   ?  ?  ?  ?  ?  ? ?Vision Patient Visual Report: No change from baseline ?  ?  ?   ?   ? ?Cognition Arousal/Alertness: Awake/alert ?Behavior During Therapy: Impulsive, Flat affect ?Overall Cognitive Status: No family/caregiver present to determine baseline cognitive functioning ?Area of Impairment: Rancho  level ?  ?  ?  ?  ?  ?  ?  ?Rancho Levels of Cognitive Functioning ?Rancho Mirant Scales of Cognitive Functioning: Confused/inappropriate/non-agitated ?  ?  ?  ?  ?  ?  ?  ?General Comments: poor safety awareness and judgement. Impulsive ?[place] Mirant Scales of Cognitive Functioning: Confused/inappropriate/non-agitated ?  ?   ?   ?   ?   ? ? ?Pertinent Vitals/ Pain       Pain Assessment ?Pain Assessment: No/denies pain ? ?Home Living Family/patient expects to be discharged to:: Private residence ?  ?  ?  ?  ?  ?  ?  ?  ?  ?  ?  ?  ?  ?  ?  ?  ?  ?  ? ?  ?   ? ?Frequency ? Min 3X/week  ? ? ? ? ?  ?Progress Toward Goals ? ?OT Goals(current goals can now be found in the care plan section) ? Progress towards OT goals: Progressing toward goals ? ?Acute Rehab OT Goals ?Patient Stated Goal: to go home ?OT Goal Formulation: With patient ?Time For Goal Achievement: 05/19/21 ?Potential to Achieve Goals: Good  ?Plan Discharge plan remains appropriate;Frequency remains appropriate   ? ?   ?AM-PAC OT "6 Clicks" Daily Activity     ?Outcome Measure ? ? Help from another person eating meals?: A Little ?Help from another person taking care of personal grooming?: A Little ?Help from another person toileting, which includes using toliet, bedpan, or urinal?: Total ?Help from another person bathing (including washing, rinsing, drying)?: Total ?Help from another person to put on and taking off regular upper body clothing?: A Little ?Help from another person to put on and taking off regular lower body clothing?: Total ?6 Click Score: 12 ? ?  ?End of Session Equipment Utilized During Treatment: Rolling walker (2 wheels) ? ?OT Visit Diagnosis: Unsteadiness on feet (R26.81);Repeated falls (R29.6);Muscle weakness (generalized) (M62.81) ?  ?Activity Tolerance Patient tolerated treatment well ?  ?Patient Left with call bell/phone within reach;in chair;with chair alarm set ?  ?Nurse Communication Mobility status ?  ? ?   ? ?Time:  0258-5277 ?OT Time Calculation (min): 23 min ? ?Charges: OT General Charges ?$OT Visit: 1 Visit ?OT Treatments ?$Self Care/Home Management : 23-37 mins ? ?Jackquline Denmark, MS, OTR/L , CBIS ?ascom [phone]  ?05/07/21, 1:37 PM  ?

## 2021-05-08 ENCOUNTER — Encounter (HOSPITAL_COMMUNITY): Payer: Self-pay

## 2021-05-08 ENCOUNTER — Other Ambulatory Visit: Payer: Self-pay

## 2021-05-08 LAB — BASIC METABOLIC PANEL
Anion gap: 8 (ref 5–15)
BUN: 8 mg/dL (ref 8–23)
CO2: 27 mmol/L (ref 22–32)
Calcium: 8.8 mg/dL — ABNORMAL LOW (ref 8.9–10.3)
Chloride: 105 mmol/L (ref 98–111)
Creatinine, Ser: 0.73 mg/dL (ref 0.61–1.24)
GFR, Estimated: >60 mL/min (ref >60)
Glucose, Bld: 98 mg/dL (ref 70–99)
Potassium: 3.8 mmol/L (ref 3.5–5.1)
Sodium: 140 mmol/L (ref 135–145)

## 2021-05-08 LAB — GLUCOSE, CAPILLARY
Glucose-Capillary: 93 mg/dL (ref 70–99)
Glucose-Capillary: 94 mg/dL (ref 70–99)
Glucose-Capillary: 91 mg/dL (ref 70–99)
Glucose-Capillary: 161 mg/dL — ABNORMAL HIGH (ref 70–99)
Glucose-Capillary: 102 mg/dL — ABNORMAL HIGH (ref 70–99)
Glucose-Capillary: 130 mg/dL — ABNORMAL HIGH (ref 70–99)

## 2021-05-08 LAB — CBC
HCT: 30.2 % — ABNORMAL LOW (ref 39.0–52.0)
Hemoglobin: 10.1 g/dL — ABNORMAL LOW (ref 13.0–17.0)
MCH: 33.3 pg (ref 26.0–34.0)
MCHC: 33.4 g/dL (ref 30.0–36.0)
MCV: 99.7 fL (ref 80.0–100.0)
Platelets: 182 10*3/uL (ref 150–400)
RBC: 3.03 MIL/uL — ABNORMAL LOW (ref 4.22–5.81)
RDW: 13.8 % (ref 11.5–15.5)
WBC: 3.8 10*3/uL — ABNORMAL LOW (ref 4.0–10.5)
nRBC: 0 % (ref 0.0–0.2)

## 2021-05-08 LAB — PHOSPHORUS: Phosphorus: 3.8 mg/dL (ref 2.5–4.6)

## 2021-05-08 LAB — MAGNESIUM: Magnesium: 2 mg/dL (ref 1.7–2.4)

## 2021-05-08 NOTE — Progress Notes (Signed)
Physical Therapy Treatment ?Patient Details ?Name: Charles Schmitt. ?MRN: [id number] ?DOB: Jul 01, 1955 ?Today's Date: 05/08/2021 ? ? ?History of Present Illness [name] presenting to [facility] ED from home via EMS after heavy drinking these past 2 weeks and multiple falls at home. The patient is unsure why he needed to come to the hospital, but does admit he fell out of bed and had difficulty getting up this morning because of weakness in his legs. Several CT scans showing traumatic SDH, SAH, and midline shift. ? ?  ?PT Comments  ? ? Pt is making limited progress towards goals. Still presents with limited carryover between sessions regarding safety/deficits. Still requires +2 assist for all mobility. Able to ambulate via body weight support system and 3 additional staff. Pt perseverates on needing to use bathroom, however unable to have BM on BSC. Continue to recommend SNF at dc. Family in room at beginning of session, however chose to leave during session.  ?Recommendations for follow up therapy are one component of a multi-disciplinary discharge planning process, led by the attending physician.  Recommendations may be updated based on patient status, additional functional criteria and insurance authorization. ? ?Follow Up Recommendations ? Skilled nursing-short term rehab (<3 hours/day) ?  ?  ?Assistance Recommended at Discharge Frequent or constant Supervision/Assistance  ?Patient can return home with the following Two people to help with walking and/or transfers;A lot of help with bathing/dressing/bathroom;Direct supervision/assist for medications management;Direct supervision/assist for financial management;Assist for transportation;Help with stairs or ramp for entrance ?  ?Equipment Recommendations ?  (TBD)  ?  ?Recommendations for Other Services   ? ? ?  ?Precautions / Restrictions Precautions ?Precautions: Fall ?Restrictions ?Weight Bearing Restrictions: No  ?  ? ?Mobility ? Bed Mobility ?Overal bed mobility: Needs  Assistance ?Bed Mobility: Supine to Sit, Sit to Supine, Rolling ?Rolling: Min assist ?  ?Supine to sit: Mod assist, +2 for physical assistance ?Sit to supine: Mod assist, +2 for physical assistance ?  ?General bed mobility comments: needs cues to initiate mobility including hand palcement and sequencing. Once seated, able to sit at bedside with close supervision ?  ? ?Transfers ?Overall transfer level: Needs assistance ?Equipment used: 2 person hand held assist ?Transfers: Sit to/from Stand, Bed to chair/wheelchair/BSC ?Sit to Stand: Max assist, From elevated surface ?Stand pivot transfers: Total assist ?  ?  ?  ?  ?General transfer comment: able to stand with +2 assist. Needs assist for foot placement in order to perform transfer to/from recliner/BSC/bed. Pt follows commands. ?  ? ?Ambulation/Gait ?Ambulation/Gait assistance: Total assist, +2 physical assistance ?Gait Distance (Feet): 100 Feet ?Assistive device: Rolling walker (2 wheels) ?Gait Pattern/deviations: Step-through pattern, Decreased step length - left, Decreased step length - right, Decreased stance time - right, Decreased stance time - left, Decreased dorsiflexion - left, Decreased weight shift to left ?  ?  ?  ?General Gait Details: ambulated using Lite Gait BWS system used. Manual facilitation used for B foot stepping with heavy emphasis of hip/knee flexion along with L dorsiflexion. With increased distance, no assist required for R LE. Very slow gait speed with narrow support. Pt unable to propel RW, needs additional staff for assist. ? ? ?Stairs ?  ?  ?  ?  ?  ? ? ?Wheelchair Mobility ?  ? ?Modified Rankin (Stroke Patients Only) ?  ? ? ?  ?Balance Overall balance assessment: Needs assistance ?Sitting-balance support: Feet supported, Single extremity supported ?Sitting balance-Leahy Scale: Fair ?  ?  ?Standing balance support: Bilateral  upper extremity supported ?Standing balance-Leahy Scale: Zero ?  ?  ?  ?  ?  ?  ?  ?  ?  ?  ?  ?  ?  ? ?   ?Cognition Arousal/Alertness: Awake/alert ?Behavior During Therapy: Impulsive, Flat affect ?Overall Cognitive Status: No family/caregiver present to determine baseline cognitive functioning ?Area of Impairment: Rancho level ?  ?  ?  ?  ?  ?  ?  ?Rancho Levels of Cognitive Functioning ?[place] [employer] Scales of Cognitive Functioning: Confused/appropriate ?  ?  ?  ?  ?  ?  ?  ?General Comments: poor safety awareness and judgement. Impulsive ?  ?[place] [employer] Scales of Cognitive Functioning: Confused/appropriate ? ?  ?Exercises Other Exercises ?Other Exercises: SPT with total assist +2 from chair->BSC for toileting tasks. Needs total assist for hygiene, however does make attempts for self cleaning with R hand. ? ?  ?General Comments   ?  ?  ? ?Pertinent Vitals/Pain Pain Assessment ?Pain Assessment: Faces ?Faces Pain Scale: Hurts a little bit ?Pain Location: L hip ?Pain Descriptors / Indicators: Aching ?Pain Intervention(s): Limited activity within patient's tolerance, Repositioned  ? ? ?Home Living   ?  ?  ?  ?  ?  ?  ?  ?  ?  ?   ?  ?Prior Function    ?  ?  ?   ? ?PT Goals (current goals can now be found in the care plan section) Acute Rehab PT Goals ?Patient Stated Goal: to go home ?PT Goal Formulation: With patient ?Time For Goal Achievement: 05/19/21 ?Potential to Achieve Goals: Good ?Progress towards PT goals: Progressing toward goals ? ?  ?Frequency ? ? ? Min 2X/week ? ? ? ?  ?PT Plan Current plan remains appropriate  ? ? ?Co-evaluation PT/OT/SLP Co-Evaluation/Treatment: Yes ?Reason for Co-Treatment: Complexity of the patient's impairments (multi-system involvement);Necessary to address cognition/behavior during functional activity;For patient/therapist safety;To address functional/ADL transfers ?PT goals addressed during session: Mobility/safety with mobility;Proper use of DME;Strengthening/ROM ?OT goals addressed during session: Strengthening/ROM;ADL's and self-care ?  ? ?  ?AM-PAC PT "6 Clicks" Mobility    ?Outcome Measure ? Help needed turning from your back to your side while in a flat bed without using bedrails?: A Little ?Help needed moving from lying on your back to sitting on the side of a flat bed without using bedrails?: A Lot ?Help needed moving to and from a bed to a chair (including a wheelchair)?: Total ?Help needed standing up from a chair using your arms (e.g., wheelchair or bedside chair)?: A Lot ?Help needed to walk in hospital room?: Total ?Help needed climbing 3-5 steps with a railing? : Total ?6 Click Score: 10 ? ?  ?End of Session   ?Activity Tolerance: Patient tolerated treatment well ?Patient left: in bed;with bed alarm set ?Nurse Communication: Mobility status ?PT Visit Diagnosis: Unsteadiness on feet (R26.81);Repeated falls (R29.6);Muscle weakness (generalized) (M62.81);History of falling (Z91.81);Difficulty in walking, not elsewhere classified (R26.2) ?  ? ? ?Time: 1332-1410 ?PT Time Calculation (min) (ACUTE ONLY): 38 min ? ?Charges:  $Gait Training: 23-37 mins          ?          ? ?Elizabeth Palau, PT, DPT, GCS ?[phone] ? ? ? ?Carley Strickling ?05/08/2021, 3:02 PM ? ?

## 2021-05-08 NOTE — Consult Note (Signed)
PHARMACY CONSULT NOTE - FOLLOW UP ? ?Pharmacy Consult for Electrolyte Monitoring and Replacement  ? ?Recent Labs: ?Potassium (mmol/L)  ?Date Value  ?05/08/2021 3.8  ? ?Magnesium (mg/dL)  ?Date Value  ?05/08/2021 2.0  ? ?Calcium (mg/dL)  ?Date Value  ?05/08/2021 8.8 (L)  ? ?Albumin (g/dL)  ?Date Value  ?05/04/2021 3.1 (L)  ?03/03/2021 3.0 (L)  ? ?Phosphorus (mg/dL)  ?Date Value  ?05/08/2021 3.8  ? ?Sodium (mmol/L)  ?Date Value  ?05/08/2021 140  ?03/03/2021 145 (H)  ? ?Corrected Calcium: 9.52 mg/dL ? ?Assessment: ?66yo M presents to ED with complaint of increased falls last couple of months, most recent being two days ago. PMH includes atrial fibrillation and HFrEF. Patient also reports increased alcohol intake. 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 Pharmacy consulted for electrolyte monitoring and replacement.  ? ?Replaced phosphorus 3/28. ? ?Goal of Therapy:  ?Electrolytes WNL ?Goal K 4.0  ? ?Plan:  ?No replacement indicated at this time. ?Will recheck electrolytes with AM labs ? ?Jaynie Bream, PharmD ?Pharmacy Resident  ?05/08/2021 ?8:38 AM' ?

## 2021-05-08 NOTE — Progress Notes (Signed)
Occupational Therapy Treatment ?Patient Details ?Name: Charles Schmitt. ?MRN: [id number] ?DOB: 1955/06/15 ?Today's Date: 05/08/2021 ? ? ?History of present illness [name] presenting to [facility] ED from home via EMS after heavy drinking these past 2 weeks and multiple falls at home. The patient is unsure why he needed to come to the hospital, but does admit he fell out of bed and had difficulty getting up this morning because of weakness in his legs. Several CT scans showing traumatic SDH, SAH, and midline shift. ?  ?OT comments ? Pt seen for skilled co-tx with PT to address self care and functional mobility. Pt rolling L <> R with min A to don brief, pants, and harness for litegait. Pt transfers with +2 assistance into recliner chair and ambulates 100' while in litegait with assistance for weight shift, weight bearing, and B LE advancement. Pt returning to recliner chair and perseverates on toileting needs throughout session so he was assisted onto [facility] with +2 assist for transfer and clothing management. Pt sitting with close supervision on BSC with no success for BM. He is able to laterally lean to the L with attempt to wipe buttocks. Pt returning to bed at end of session. All needs within reach.   ? ?Recommendations for follow up therapy are one component of a multi-disciplinary discharge planning process, led by the attending physician.  Recommendations may be updated based on patient status, additional functional criteria and insurance authorization. ?   ?Follow Up Recommendations ? Skilled nursing-short term rehab (<3 hours/day)  ?  ?Assistance Recommended at Discharge Frequent or constant Supervision/Assistance  ?Patient can return home with the following ? Two people to help with walking and/or transfers;Two people to help with bathing/dressing/bathroom;Assistance with cooking/housework;Help with stairs or ramp for entrance;Assist for transportation;Direct supervision/assist for medications management;Direct  supervision/assist for financial management ?  ?Equipment Recommendations ? Other (comment) (defer to next venue of care)  ?  ?   ?Precautions / Restrictions Precautions ?Precautions: Fall ?Restrictions ?Weight Bearing Restrictions: No  ? ? ?  ? ?Mobility Bed Mobility ?Overal bed mobility: Needs Assistance ?Bed Mobility: Supine to Sit, Sit to Supine ?  ?  ?Supine to sit: Mod assist, +2 for physical assistance ?Sit to supine: Mod assist, +2 for physical assistance ?  ?General bed mobility comments: needs cues to initiate mobility including hand palcement and sequencing. Once seated, able to sit at bedside with close supervision ?  ? ?Transfers ?Overall transfer level: Needs assistance ?Equipment used: 2 person hand held assist ?Transfers: Sit to/from Stand, Bed to chair/wheelchair/BSC ?Sit to Stand: Max assist, From elevated surface ?Stand pivot transfers: Total assist ?  ?Step pivot transfers: Max assist ?  ?  ?General transfer comment: able to stand with +2 assist. Needs assist for foot placement in order to perform transfer to/from recliner/BSC/bed. Pt follows commands. ?  ?  ?Balance Overall balance assessment: Needs assistance ?Sitting-balance support: Feet supported, Single extremity supported ?Sitting balance-Leahy Scale: Fair ?  ?  ?Standing balance support: Bilateral upper extremity supported ?Standing balance-Leahy Scale: Zero ?Standing balance comment: +2 assist for safety ?  ?  ?  ?  ?  ?  ?  ?  ?  ?  ?  ?   ? ?ADL either performed or assessed with clinical judgement  ? ?ADL Overall ADL's : Needs assistance/impaired ?  ?  ?  ?  ?  ?  ?  ?  ?  ?  ?Lower Body Dressing: Total assistance;Bed level ?  ?Toilet Transfer:  Maximal assistance;Stand-pivot;Rolling walker (2 wheels);BSC/3in1 ?  ?Toileting- Clothing Manipulation and Hygiene: Maximal assistance;Sit to/from stand ?  ?  ?  ?  ?  ?  ? ?Extremity/Trunk Assessment Upper Extremity Assessment ?Upper Extremity Assessment: Defer to OT evaluation ?LUE Deficits /  Details: decreased dexterity, speed, and coordination but overall appears functional with ROM and strength ?  ?Lower Extremity Assessment ?Lower Extremity Assessment: Generalized weakness ?  ?  ?  ? ?Vision Patient Visual Report: No change from baseline ?  ?  ?   ?   ? ?Cognition Arousal/Alertness: Awake/alert ?Behavior During Therapy: Impulsive, Flat affect ?Overall Cognitive Status: No family/caregiver present to determine baseline cognitive functioning ?Area of Impairment: [place]o level ?  ?  ?  ?  ?  ?  ?  ?Rancho Levels of Cognitive Functioning ?Rancho [employer] Scales of Cognitive Functioning: Confused/appropriate ?  ?  ?  ?  ?  ?  ?  ?General Comments: poor safety awareness and judgement. Impulsive ?Rancho [employer] Scales of Cognitive Functioning: Confused/appropriate ?  ?   ?   ?   ?   ? ? ?Pertinent Vitals/ Pain       Pain Assessment ?Pain Assessment: Faces ?Faces Pain Scale: Hurts a little bit ?Pain Location: L hip ?Pain Descriptors / Indicators: Aching ?Pain Intervention(s): Limited activity within patient's tolerance, Repositioned ? ?   ?   ? ?Frequency ? Min 3X/week  ? ? ? ? ?  ?Progress Toward Goals ? ?OT Goals(current goals can now be found in the care plan section) ? Progress towards OT goals: Progressing toward goals ? ?Acute Rehab OT Goals ?Patient Stated Goal: to go home ?OT Goal Formulation: With patient ?Time For Goal Achievement: 05/19/21 ?Potential to Achieve Goals: Good  ?Plan Discharge plan remains appropriate;Frequency remains appropriate   ? ?Co-evaluation ? ? ? PT/OT/SLP Co-Evaluation/Treatment: Yes ?Reason for Co-Treatment: Complexity of the patient's impairments (multi-system involvement);Necessary to address cognition/behavior during functional activity;For patient/therapist safety;To address functional/ADL transfers ?PT goals addressed during session: Mobility/safety with mobility;Proper use of DME;Strengthening/ROM ?OT goals addressed during session: Strengthening/ROM;ADL's and  self-care ?  ? ?  ?AM-PAC OT "6 Clicks" Daily Activity     ?Outcome Measure ? ? Help from another person eating meals?: A Little ?Help from another person taking care of personal grooming?: A Little ?Help from another person toileting, which includes using toliet, bedpan, or urinal?: Total ?Help from another person bathing (including washing, rinsing, drying)?: Total ?Help from another person to put on and taking off regular upper body clothing?: A Little ?Help from another person to put on and taking off regular lower body clothing?: Total ?6 Click Score: 12 ? ?  ?End of Session Equipment Utilized During Treatment: Rolling walker (2 wheels);Other (comment) (litegait) ? ?OT Visit Diagnosis: Unsteadiness on feet (R26.81);Repeated falls (R29.6);Muscle weakness (generalized) (M62.81) ?  ?Activity Tolerance Patient tolerated treatment well ?  ?Patient Left with call bell/phone within reach;in chair;with chair alarm set ?  ?Nurse Communication Mobility status ?  ? ?   ? ?Time: [id number] ?OT Time Calculation (min): 38 min ? ?Charges: OT General Charges ?$OT Visit: 1 Visit ?OT Treatments ?$Self Care/Home Management : 8-22 mins ? ? ?

## 2021-05-08 NOTE — TOC Initial Note (Signed)
Transition of Care (TOC) - Initial/Assessment Note  ? ? ?Patient Details  ?Name: Charles Schmitt. ?MRN: [id number] ?Date of Birth: Jun 16, 1955 ? ?Transition of Care (TOC) CM/SW Contact:    ?Shelbie Hutching, RN ?Phone Number: ?05/08/2021, 11:07 AM ? ?Clinical Narrative:                 ?Patient admitted to the hospital with a subarachnoid hemorrhage.  RNCM met with patient at the bedside, patient is confused he is trying to call his girlfriend, [name] on the phone but having trouble.  He refers to [facility] as his wife but they are not married.  Carolin Sicks is the patient's daughter in law and reports that they may as well be married they have been together for 56 years at least.  Son and daughter in law are next of kin and decision makers. ? ?Patient before admission was independent at home and a heavy drinker, Carolin Sicks reports that he could finish off a gallon of liquor in 2 days. ? ?Original recommendation for Acute Inpatient rehab but [facility] will not accept due to lack of family ability to provide full time 24/7 care at discharge. ? ?Current recommendation for SNF, family agrees with recommendation.  Patient has no insurance but Carolin Sicks reports applying for Medicaid about 2 months ago and applying for food stamps for the patient.  Patient has received the food stamps but no word no the Medicaid app yet. ? ?RNCM will reach out to the financial counselor to have them check on Medicaid status. ? ?TOC should be able to offer and LOG for SNF.  RNCM will begin bed search. ? ?Expected Discharge Plan: [place] ?Barriers to Discharge: Continued Medical Work up ? ? ?Patient Goals and CMS Choice ?Patient states their goals for this hospitalization and ongoing recovery are:: patient wants to smoke a cigarette and go home- confused- family wants patient to get rehab ?CMS Medicare.gov Compare Post Acute Care list provided to:: Patient Represenative (must comment) ?Choice offered to / list presented to : Adult  Children ? ?Expected Discharge Plan and Services ?Expected Discharge Plan: [place] ?  ?Discharge Planning Services: CM Consult ?Post Acute Care Choice: [place] ?Living arrangements for the past 2 months: Mobile Home ?                ?  ?  ?  ?  ?  ?  ?  ?  ?  ?  ? ?Prior Living Arrangements/Services ?Living arrangements for the past 2 months: Mobile Home ?Lives with:: Significant Other ?Patient language and need for interpreter reviewed:: Yes ?Do you feel safe going back to the place where you live?: Yes      ?Need for Family Participation in Patient Care: Yes (Comment) ?Care giver support system in place?: Yes (comment) (son and daughter in law and girlfriend) ?  ?Criminal Activity/Legal Involvement Pertinent to Current Situation/Hospitalization: No - Comment as needed ? ?Activities of Daily Living ?Home Assistive Devices/Equipment: Gilford Rile (specify type) ?ADL Screening (condition at time of admission) ?Patient's cognitive ability adequate to safely complete daily activities?: Yes ?Is the patient deaf or have difficulty hearing?: No ?Does the patient have difficulty seeing, even when wearing glasses/contacts?: No ?Does the patient have difficulty concentrating, remembering, or making decisions?: No ?Patient able to express need for assistance with ADLs?: Yes ?Does the patient have difficulty dressing or bathing?: No ?Independently performs ADLs?: No ?Communication: Independent ?Dressing (OT): Independent ?Grooming: Independent ?Feeding: Independent ?Bathing: Independent ?Toileting: Independent ?In/Out  Bed: Independent ?Walks in Home: Independent ?Does the patient have difficulty walking or climbing stairs?: Yes ?Weakness of Legs: Left ?Weakness of Arms/Hands: None ? ?Permission Sought/Granted ?Permission sought to share information with : Case Manager, Customer service manager, Family Supports ?Permission granted to share information with : Yes, Verbal Permission  Granted ? Share Information with NAME: Stephane Niemann ? Permission granted to share info w AGENCY: SNF's ? Permission granted to share info w Relationship: daughter in law ? Permission granted to share info w Contact Information: [phone] ? ?Emotional Assessment ?Appearance:: Appears older than stated age ?Attitude/Demeanor/Rapport: Engaged ?Affect (typically observed): Accepting ?Orientation: : Oriented to Self ?Alcohol / Substance Use: Alcohol Use ?Psych Involvement: No (comment) ? ?Admission diagnosis:  Subarachnoid hemorrhage ([place]) [I60.9] ?Hypokalemia [E87.6] ?Subdural hematoma ([place]) [S06.5XAA] ?SAH (subarachnoid hemorrhage) ([place]) [I60.9] ?Intraparenchymal hemorrhage of brain ([place]) [I61.9] ?Injury of head, initial encounter [S09.90XA] ?Patient Active Problem List  ? Diagnosis Date Noted  ? SAH (subarachnoid hemorrhage) ([place]) 05/03/2021  ? Subdural hematoma   ? Protein-calorie malnutrition, severe 02/10/2021  ? Underweight 02/08/2021  ? Cardiomyopathy ([place])   ? Pressure injury of skin 02/05/2021  ? Sinus tachycardia 02/05/2021  ? Bone lesion 02/05/2021  ? Chronic systolic heart failure ([place]) 02/05/2021  ? Hypokalemia 02/05/2021  ? Hyperglycemia 02/05/2021  ? Alcohol use 02/05/2021  ? Multifocal pneumonia   ? Anemia   ? Essential hypertension   ? ?PCP:  Pcp, No ?Pharmacy:   ?[facility] ?[address], [address] ?[place] [address] ?Phone: [phone] Fax: [phone] ? ?[id number] [place] [address]?Phone: [phone]: [phone]? ?[place] [address]?Phone: [phone]: [phone]? ? ? ? ?Social Determinants of Health (SDOH) Interventions ?  ? ?Readmission Risk Interventions ?   ? View : No data to display.  ?  ?  ?  ? ? ? ?

## 2021-05-08 NOTE — Progress Notes (Signed)
? ? ? ?Progress Note  ? ? ?Charles PaceFrank Speaker Jr.  WUJ:[id number] DOB: 05-25-1955  DOA: 05/03/2021 ?PCP: Pcp, No  ? ? ? ? ?Brief Narrative:  ? ? ?Medical records reviewed and are as summarized below: ? ?Charles PaceFrank Steger Jr. is a 66 y.o. male who was brought to the hospital because of multiple falls due to alcohol intoxication.  He was found to have acute subdural hematoma. ? ? ? ? ? ? ?Assessment/Plan:  ? ?Principal Problem: ?  SAH (subarachnoid hemorrhage) (HCC) ?Active Problems: ?  Subdural hematoma ? ? ?Nutrition Problem: Severe Malnutrition ?Etiology: chronic illness (ETOH abuse) ? ?Signs/Symptoms: moderate fat depletion, severe fat depletion, moderate muscle depletion, severe muscle depletion ? ? ?Body mass index is 16.42 kg/m?. ? ?Acute traumatic left subdural hematoma, bilateral subarachnoid hemorrhage, intraparenchymal hemorrhagic contusion, multiple falls: ?Neurosurgery recommended conservative management.  Outpatient follow-up with neurosurgery.  Continue fall precautions.  PT and OT recommended discharge to SNF.  ? ? ?Alcohol use disorder ?Continue Librium taper.  Use Ativan as needed for agitation/withdrawal syndrome. ? ? ?Hypokalemia, hypoglycemia ?Resolved ? ? ?Mildly elevated liver enzymes may be from alcohol use disorder ? ?Other comorbidities include chronic diastolic CHF, paroxysmal atrial fibrillation, ischemic cardiomyopathy, hypertension ? ?Diet Order   ? ?       ?  DIET DYS 3 Room service appropriate? Yes with Assist; Fluid consistency: Thin  Diet effective now       ?  ? ?  ?  ? ?  ? ? ? ?Consultants: ?Neurosurgeon ? ?Procedures: ?None ? ? ? ?Medications:  ? ? (feeding supplement) PROSource Plus  30 mL Oral TID BM  ? chlordiazePOXIDE  25 mg Oral BH-qamhs  ? Followed by  ? [START ON 05/10/2021] chlordiazePOXIDE  25 mg Oral Daily  ? Chlorhexidine Gluconate Cloth  6 each Topical Daily  ? docusate sodium  100 mg Oral BID  ? feeding supplement  237 mL Oral TID BM  ? folic acid  1 mg Oral Daily  ? lidocaine   1 patch Transdermal Q24H  ? metoprolol succinate  12.5 mg Oral Daily  ? multivitamin with minerals  1 tablet Oral Daily  ? polyethylene glycol  17 g Oral Daily  ? [START ON 05/12/2021] thiamine injection  100 mg Intravenous Daily  ? Or  ? [START ON 05/12/2021] thiamine  100 mg Oral Daily  ? thiamine  250 mg Oral Daily  ? ?Continuous Infusions: ? ? ?Anti-infectives (From admission, onward)  ? ? None  ? ?  ? ? ? ? ? ? ? ? ? ?Family Communication/Anticipated D/C date and plan/Code Status  ? ?DVT prophylaxis: SCDs Start: 05/03/21 1912 ? ? ?  Code Status: Full Code ? ?Family Communication: Luna KitchensLatasha, daughter-in-law, over the phone (patient gave permission for this) ?Disposition Plan: Plan to discharge to SNF ? ? ?Status is: Inpatient ?Remains inpatient appropriate because: Unsafe discharge, awaiting placement to SNF ? ? ? ? ? ? ?Subjective:  ? ?He still has some confusion.  He complains of generalized weakness.  He is hoping to go home soon.  His nurse, Britta MccreedyBarbara, was at the bedside. ? ?Objective:  ? ? ?Vitals:  ? 05/08/21 0400 05/08/21 0440 05/08/21 0500 05/08/21 0600  ?BP: (!) 141/105     ?Pulse: (!) 105   71  ?Resp: (!) 21 20 19 16   ?Temp: 98.2 ?F (36.8 ?C)     ?TempSrc: Oral     ?SpO2: 98%   95%  ?Weight:      ?Height:      ? ?  No data found. ? ? ?Intake/Output Summary (Last 24 hours) at 05/08/2021 1332 ?Last data filed at 05/08/2021 0300 ?Gross per 24 hour  ?Intake 60 ml  ?Output 700 ml  ?Net -640 ml  ? ?Filed Weights  ? 05/06/21 0401 05/07/21 0420 05/08/21 0331  ?Weight: 61.2 kg 62.3 kg 62.8 kg  ? ? ?Exam: ? ?GEN: NAD ?SKIN: No rash ?EYES: EOMI ?ENT: MMM ?CV: RRR ?PULM: CTA B ?ABD: soft, ND, NT, +BS ?CNS: AAO x 2 (person and place), non focal ?EXT: No edema or tenderness ? ? ? ? ? ?Data Reviewed:  ? ?I have personally reviewed following labs and imaging studies: ? ?Labs: ?Labs show the following:  ? ?Basic Metabolic Panel: ?Recent Labs  ?Lab 05/04/21 ?1210 05/05/21 ?0223 05/06/21 ?0419 05/07/21 ?0559 05/08/21 ?0630  ?NA 137  142 138 138 140  ?K 4.0 4.2 3.3* 3.5 3.8  ?CL 98 101 101 104 105  ?CO2 28 31 29 27 27   ?GLUCOSE 108* 146* 93 109* 98  ?BUN 9 9 9 10 8   ?CREATININE 0.84 0.92 0.96 0.83 0.73  ?CALCIUM 8.5* 8.7* 8.4* 8.4* 8.8*  ?MG 2.2 1.8 2.1 2.0 2.0  ?PHOS 2.7 3.1 2.3* 3.2 3.8  ? ?GFR ?Estimated Creatinine Clearance: 84 mL/min (by C-G formula based on SCr of 0.73 mg/dL). ?Liver Function Tests: ?Recent Labs  ?Lab 05/03/21 ?1937 05/04/21 ?0506  ?AST 47* 44*  ?ALT 24 22  ?ALKPHOS 127* 110  ?BILITOT 1.1 1.2  ?PROT 7.6 6.5  ?ALBUMIN 3.3* 3.1*  ? ?No results for input(s): LIPASE, AMYLASE in the last 168 hours. ?No results for input(s): AMMONIA in the last 168 hours. ?Coagulation profile ?Recent Labs  ?Lab 05/03/21 ?1937 05/04/21 ?0724  ?INR 1.0 1.1  ? ? ?CBC: ?Recent Labs  ?Lab 05/03/21 ?1937 05/04/21 ?0506 05/05/21 ?05/06/21 05/07/21 ?1412 05/08/21 ?0630  ?WBC 4.8 4.1 2.0* 3.2* 3.8*  ?HGB 11.9* 9.8* 10.4* 9.4* 10.1*  ?HCT 35.2* 28.6* 30.8* 28.4* 30.2*  ?MCV 98.1 98.3 98.1 100.4* 99.7  ?PLT 115* 106* 102* 142* 182  ? ?Cardiac Enzymes: ?No results for input(s): CKTOTAL, CKMB, CKMBINDEX, TROPONINI in the last 168 hours. ?BNP (last 3 results) ?No results for input(s): PROBNP in the last 8760 hours. ?CBG: ?Recent Labs  ?Lab 05/07/21 ?1916 05/07/21 ?2330 05/08/21 ?0328 05/08/21 ?05/10/21 05/08/21 ?1118  ?GLUCAP 203* 156* 93 94 91  ? ?D-Dimer: ?No results for input(s): DDIMER in the last 72 hours. ?Hgb A1c: ?No results for input(s): HGBA1C in the last 72 hours. ?Lipid Profile: ?No results for input(s): CHOL, HDL, LDLCALC, TRIG, CHOLHDL, LDLDIRECT in the last 72 hours. ?Thyroid function studies: ?No results for input(s): TSH, T4TOTAL, T3FREE, THYROIDAB in the last 72 hours. ? ?Invalid input(s): FREET3 ?Anemia work up: ?No results for input(s): VITAMINB12, FOLATE, FERRITIN, TIBC, IRON, RETICCTPCT in the last 72 hours. ?Sepsis Labs: ?Recent Labs  ?Lab 05/04/21 ?0506 05/05/21 ?0223 05/07/21 ?1412 05/08/21 ?0630  ?WBC 4.1 2.0* 3.2* 3.8*   ? ? ?Microbiology ?Recent Results (from the past 240 hour(s))  ?Resp Panel by RT-PCR (Flu A&B, Covid) Nasopharyngeal Swab     Status: None  ? Collection Time: 05/03/21  7:37 PM  ? Specimen: Nasopharyngeal Swab; Nasopharyngeal(NP) swabs in vial transport medium  ?Result Value Ref Range Status  ? SARS Coronavirus 2 by RT PCR NEGATIVE NEGATIVE Final  ?  Comment: (NOTE) ?SARS-CoV-2 target nucleic acids are NOT DETECTED. ? ?The SARS-CoV-2 RNA is generally detectable in upper respiratory ?specimens during the acute phase of infection. The lowest ?concentration of SARS-CoV-2  viral copies this assay can detect is ?138 copies/mL. A negative result does not preclude SARS-Cov-2 ?infection and should not be used as the sole basis for treatment or ?other patient management decisions. A negative result may occur with  ?improper specimen collection/handling, submission of specimen other ?than nasopharyngeal swab, presence of viral mutation(s) within the ?areas targeted by this assay, and inadequate number of viral ?copies(<138 copies/mL). A negative result must be combined with ?clinical observations, patient history, and epidemiological ?information. The expected result is Negative. ? ?Fact Sheet for Patients:  ?BloggerCourse.com ? ?Fact Sheet for Healthcare Providers:  ?SeriousBroker.it ? ?This test is no t yet approved or cleared by the [place] FDA and  ?has been authorized for detection and/or diagnosis of SARS-CoV-2 by ?FDA under an Emergency Use Authorization (EUA). This EUA will remain  ?in effect (meaning this test can be used) for the duration of the ?COVID-19 declaration under Section 564(b)(1) of the Act, 21 ?U.S.C.section 360bbb-3(b)(1), unless the authorization is terminated  ?or revoked sooner.  ? ? ?  ? Influenza A by PCR NEGATIVE NEGATIVE Final  ? Influenza B by PCR NEGATIVE NEGATIVE Final  ?  Comment: (NOTE) ?The Xpert Xpress SARS-CoV-2/FLU/RSV plus assay is  intended as an aid ?in the diagnosis of influenza from Nasopharyngeal swab specimens and ?should not be used as a sole basis for treatment. Nasal washings and ?aspirates are unacceptable for Xpert Xpress SARS-CoV-2/FLU/RS

## 2021-05-08 NOTE — NC FL2 (Signed)
?[place] MEDICAID FL2 LEVEL OF CARE SCREENING TOOL  ?  ? ?IDENTIFICATION  ?Patient Name: ?Charles Schmitt. Birthdate: [birth year] Sex: male Admission Date (Current Location): ?05/03/2021  ?[place] Number: ? [place] ?  Facility and Address:  ?[facility], [address] [place], [place] [address] ?     Provider Number: ?[id number]  ?Attending Physician Name and Address:  ?Lurene Shadow, MD ? Relative Name and Phone Number:  ?Azell Bill- daughter in law- [phone] ?   ?Current Level of Care: ?Hospital Recommended Level of Care: ?Skilled Nursing Facility Prior Approval Number: ?  ? ?Date Approved/Denied: ?  PASRR Number: ?[id number] A ? ?Discharge Plan: ?SNF ?  ? ?Current Diagnoses: ?Patient Active Problem List  ? Diagnosis Date Noted  ? SAH (subarachnoid hemorrhage) (HCC) 05/03/2021  ? Subdural hematoma   ? Protein-calorie malnutrition, severe 02/10/2021  ? Underweight 02/08/2021  ? Cardiomyopathy (HCC)   ? Pressure injury of skin 02/05/2021  ? Sinus tachycardia 02/05/2021  ? Bone lesion 02/05/2021  ? Chronic systolic heart failure (HCC) 02/05/2021  ? Hypokalemia 02/05/2021  ? Hyperglycemia 02/05/2021  ? Alcohol use 02/05/2021  ? Multifocal pneumonia   ? Anemia   ? Essential hypertension   ? ? ?Orientation RESPIRATION BLADDER Height & Weight   ?  ?Self, Place ? Normal External catheter Weight: 62.8 kg ?Height:  6\' 5"  (195.6 cm)  ?BEHAVIORAL SYMPTOMS/MOOD NEUROLOGICAL BOWEL NUTRITION STATUS  ?    Continent Diet (Dysphagia diet 3)  ?AMBULATORY STATUS COMMUNICATION OF NEEDS Skin   ?Extensive Assist Verbally Normal ?  ?  ?  ?    ?     ?     ? ? ?Personal Care Assistance Level of Assistance  ?Bathing, Feeding, Dressing Bathing Assistance: Maximum assistance ?Feeding assistance: Limited assistance ?Dressing Assistance: Maximum assistance ?   ? ?Functional Limitations Info  ?Sight, Hearing, Speech Sight Info: Adequate ?Hearing Info: Adequate ?Speech Info: Impaired   ? ? ?SPECIAL CARE FACTORS FREQUENCY  ?PT (By licensed PT), OT (By licensed OT), Speech therapy   ?  ?PT Frequency: 5 days per week ?OT Frequency: 5 days per week ?  ?  ?Speech Therapy Frequency: eval at facility ?   ? ? ?Contractures Contractures Info: Not present  ? ? ?Additional Factors Info  ?Code Status, Allergies Code Status Info: Full ?Allergies Info: NKA ?  ?  ?  ?   ? ?Current Medications (05/08/2021):  This is the current hospital active medication list ?Current Facility-Administered Medications  ?Medication Dose Route Frequency Provider Last Rate Last Admin  ? (feeding supplement) PROSource Plus liquid 30 mL  30 mL Oral TID BM 05/10/2021, MD   30 mL at 05/08/21 05/10/21  ? acetaminophen (TYLENOL) tablet 650 mg  650 mg Oral Q4H PRN Rust-Chester, 8309, NP   650 mg at 05/04/21 1450  ? chlordiazePOXIDE (LIBRIUM) capsule 25 mg  25 mg Oral BH-qamhs 05/06/21, MD   25 mg at 05/08/21 05/10/21  ? Followed by  ? [START ON 05/10/2021] chlordiazePOXIDE (LIBRIUM) capsule 25 mg  25 mg Oral Daily 07/10/2021, MD      ? Chlorhexidine Gluconate Cloth 2 % PADS 6 each  6 each Topical Daily Marcelo Baldy, MD   6 each at 05/07/21 2220  ? docusate sodium (COLACE) capsule 100 mg  100 mg Oral BID 2221, RPH   100 mg at 05/08/21 05/10/21  ? feeding supplement (ENSURE ENLIVE / ENSURE PLUS) liquid 237 mL  237 mL Oral TID BM Lolita Patella B, MD   237 mL at 05/08/21 2951  ? folic acid (FOLVITE) tablet 1 mg  1 mg Oral Daily Rust-Chester, Britton L, NP   1 mg at 05/08/21 8841  ? lidocaine (LIDODERM) 5 % 1 patch  1 patch Transdermal Q24H Marcelo Baldy, MD   1 patch at 05/07/21 1606  ? LORazepam (ATIVAN) injection 1-2 mg  1-2 mg Intravenous Q1H PRN Rust-Chester, Britton L, NP   2 mg at 05/08/21 0152  ? metoprolol succinate (TOPROL-XL) 24 hr tablet 12.5 mg  12.5 mg Oral Daily Marcelo Baldy, MD   12.5 mg at 05/08/21 6606  ? multivitamin with minerals tablet 1 tablet  1 tablet Oral Daily Rust-Chester,  Cecelia Byars, NP   1 tablet at 05/08/21 0930  ? ondansetron (ZOFRAN) injection 4 mg  4 mg Intravenous Q6H PRN Rust-Chester, Cecelia Byars, NP   4 mg at 05/03/21 2052  ? polyethylene glycol (MIRALAX / GLYCOLAX) packet 17 g  17 g Oral Daily Martyn Malay, RPH   17 g at 05/08/21 3016  ? [START ON 05/12/2021] thiamine (B-1) injection 100 mg  100 mg Intravenous Daily Rust-Chester, Cecelia Byars, NP      ? Or  ? [START ON 05/12/2021] thiamine tablet 100 mg  100 mg Oral Daily Rust-Chester, Britton L, NP      ? thiamine tablet 250 mg  250 mg Oral Daily Martyn Malay, RPH   250 mg at 05/08/21 0109  ? ? ? ?Discharge Medications: ?Please see discharge summary for a list of discharge medications. ? ?Relevant Imaging Results: ? ?Relevant Lab Results: ? ? ?Additional Information ?SS# [id number] ? ?Allayne Butcher, RN ? ? ? ? ?

## 2021-05-08 NOTE — Plan of Care (Signed)
Patient intermit. confused

## 2021-05-09 ENCOUNTER — Other Ambulatory Visit: Payer: Self-pay

## 2021-05-09 LAB — BASIC METABOLIC PANEL
Anion gap: 8 (ref 5–15)
BUN: 9 mg/dL (ref 8–23)
CO2: 26 mmol/L (ref 22–32)
Calcium: 8.5 mg/dL — ABNORMAL LOW (ref 8.9–10.3)
Chloride: 106 mmol/L (ref 98–111)
Creatinine, Ser: 0.68 mg/dL (ref 0.61–1.24)
GFR, Estimated: >60 mL/min (ref >60)
Glucose, Bld: 114 mg/dL — ABNORMAL HIGH (ref 70–99)
Potassium: 3.3 mmol/L — ABNORMAL LOW (ref 3.5–5.1)
Sodium: 140 mmol/L (ref 135–145)

## 2021-05-09 LAB — PHOSPHORUS: Phosphorus: 3.9 mg/dL (ref 2.5–4.6)

## 2021-05-09 LAB — GLUCOSE, CAPILLARY
Glucose-Capillary: 129 mg/dL — ABNORMAL HIGH (ref 70–99)
Glucose-Capillary: 122 mg/dL — ABNORMAL HIGH (ref 70–99)
Glucose-Capillary: 107 mg/dL — ABNORMAL HIGH (ref 70–99)
Glucose-Capillary: 138 mg/dL — ABNORMAL HIGH (ref 70–99)
Glucose-Capillary: 113 mg/dL — ABNORMAL HIGH (ref 70–99)

## 2021-05-09 LAB — MAGNESIUM: Magnesium: 1.7 mg/dL (ref 1.7–2.4)

## 2021-05-09 MED ORDER — MAGNESIUM SULFATE 2 GM/50ML IV SOLN
2.0000 g | Freq: Once | INTRAVENOUS | Status: AC
  Administered 2021-05-09: 2 g via INTRAVENOUS
  Filled 2021-05-09: qty 50

## 2021-05-09 MED ORDER — SODIUM CHLORIDE 0.9 % IV SOLN
INTRAVENOUS | Status: DC | PRN
Start: 2021-05-09 — End: 2021-05-23
  Administered 2021-05-19: 10 mL/h via INTRAVENOUS

## 2021-05-09 MED ORDER — POTASSIUM CHLORIDE CRYS ER 20 MEQ PO TBCR
40.0000 meq | EXTENDED_RELEASE_TABLET | ORAL | Status: AC
  Administered 2021-05-09 (×2): 40 meq via ORAL
  Filled 2021-05-09 (×2): qty 2

## 2021-05-09 NOTE — Consult Note (Signed)
PHARMACY CONSULT NOTE - FOLLOW UP ? ?Pharmacy Consult for Electrolyte Monitoring and Replacement  ? ?Recent Labs: ?Potassium (mmol/L)  ?Date Value  ?05/09/2021 3.3 (L)  ? ?Magnesium (mg/dL)  ?Date Value  ?05/09/2021 1.7  ? ?Calcium (mg/dL)  ?Date Value  ?05/09/2021 8.5 (L)  ? ?Albumin (g/dL)  ?Date Value  ?05/04/2021 3.1 (L)  ?03/03/2021 3.0 (L)  ? ?Phosphorus (mg/dL)  ?Date Value  ?05/09/2021 3.9  ? ?Sodium (mmol/L)  ?Date Value  ?05/09/2021 140  ?03/03/2021 145 (H)  ? ?Corrected Calcium: 9.22 mg/dL ? ?Assessment: ?66yo M presents to ED with complaint of increased falls last couple of months, most recent being two days ago. PMH includes atrial fibrillation and HFrEF. Patient also reports increased alcohol intake. 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 Pharmacy consulted for electrolyte monitoring and replacement.  ? ?Goal of Therapy:  ?Electrolytes WNL ?Goal K 4.0  ? ?Plan:  ?K low at 3.3. Give 40 mEq x 2.  ?Magnesium slightly low at 1.7. Give magnesium sulfate 2 grams x 1. ?Follow up electrolytes with AM labs ? ? ?Wynelle Cleveland, PharmD ?Pharmacy Resident  ?05/09/2021 ?7:10 AM' ?

## 2021-05-09 NOTE — Progress Notes (Signed)
PT Cancellation Note ? ?Patient Details ?Name: Charles Schmitt. ?MRN: [id number] ?DOB: 05/02/1955 ? ? ?Cancelled Treatment:     PT attempt. Pt was just starting to eat lunch and requested author return. Will return shortly to progress pt with OOB activity.   ? ? ?Rushie Chestnut ?05/09/2021, 1:58 PM ?

## 2021-05-09 NOTE — Progress Notes (Signed)
Physical Therapy Treatment ?Patient Details ?Name: Charles Schmitt. ?MRN: [id number] ?DOB: 04-05-1955 ?Today's Date: 05/09/2021 ? ? ?History of Present Illness [name] presenting to [facility] ED from home via EMS after heavy drinking these past 2 weeks and multiple falls at home. The patient is unsure why he needed to come to the hospital, but does admit he fell out of bed and had difficulty getting up this morning because of weakness in his legs. Several CT scans showing traumatic SDH, SAH, and midline shift. ? ?  ?PT Comments  ? ? Pt was long sitting in bed upon arriving. He agrees to session and is eagerly requesting to go to [facility] for BM. Pt was able to exit L side of bed with max assist + increased time. Pt is severely limited by cognition and poor motor planning. Does require a lot of assistance to initiate movements. He was able to stand and pivot to/from [facility]. Had successful BM. RN assisted with hygiene care while author assisted pt with standing. Pt will require extensive PT going forward to address deficits and maximize independence with ADLs.  ?  ?Recommendations for follow up therapy are one component of a multi-disciplinary discharge planning process, led by the attending physician.  Recommendations may be updated based on patient status, additional functional criteria and insurance authorization. ? ?Follow Up Recommendations ? Skilled nursing-short term rehab (<3 hours/day) ?  ?  ?Assistance Recommended at Discharge Frequent or constant Supervision/Assistance  ?Patient can return home with the following Two people to help with walking and/or transfers;A lot of help with bathing/dressing/bathroom;Direct supervision/assist for medications management;Direct supervision/assist for financial management;Assist for transportation;Help with stairs or ramp for entrance ?  ?Equipment Recommendations ?  (ongoing assessment)  ?  ?   ?Precautions / Restrictions Precautions ?Precautions: Fall ?Restrictions ?Weight Bearing  Restrictions: No  ?  ? ?Mobility ? Bed Mobility ?Overal bed mobility: Needs Assistance ?Bed Mobility: Supine to Sit, Sit to Supine ?  ?  ?Supine to sit: Max assist ?Sit to supine: Max assist ?  ?General bed mobility comments: pt struggles with sequencing and following commands. ?  ? ?Transfers ?Overall transfer level: Needs assistance ?Equipment used: Rolling walker (2 wheels), None ?Transfers: Sit to/from Stand, Bed to chair/wheelchair/BSC ?Sit to Stand: Max assist, From elevated surface ?Stand pivot transfers: Total assist ?Step pivot transfers: Max assist ?  ?  ?  ?General transfer comment: Pt was able to stand 2 x EOB and perform stand pivot to/from [facility] ?  ? ?Ambulation/Gait ?  ? General Gait Details: unsafe without lift or + 2 assistance ? ? ? ?  ?Balance Overall balance assessment: Needs assistance ?Sitting-balance support: Feet supported, Single extremity supported ?Sitting balance-Leahy Scale: Fair ?  ?  ?Standing balance support: Bilateral upper extremity supported ?Standing balance-Leahy Scale: Zero ?  ?  ?  ?  ?Cognition Arousal/Alertness: Awake/alert ?Behavior During Therapy: Impulsive, Flat affect ?Overall Cognitive Status: No family/caregiver present to determine baseline cognitive functioning ?  ?   ?General Comments: Pt A and O x 4 ?  ?  ? ?  ?   ?   ? ?Pertinent Vitals/Pain Pain Assessment ?Pain Assessment: 0-10 ?Pain Score: 4  ?Pain Location: L hip ?Pain Descriptors / Indicators: Aching  ? ? ? ?PT Goals (current goals can now be found in the care plan section) Acute Rehab PT Goals ?Patient Stated Goal: to go home ?Progress towards PT goals: Progressing toward goals ? ?  ?Frequency ? ? ? Min 2X/week ? ? ? ?  ?  PT Plan Current plan remains appropriate  ? ? ?Co-evaluation   ?  ?PT goals addressed during session: Mobility/safety with mobility;Balance;Proper use of DME;Strengthening/ROM ?  ?  ? ?  ?AM-PAC PT "6 Clicks" Mobility   ?Outcome Measure ? Help needed turning from your back to your side while in  a flat bed without using bedrails?: A Little ?Help needed moving from lying on your back to sitting on the side of a flat bed without using bedrails?: A Lot ?Help needed moving to and from a bed to a chair (including a wheelchair)?: A Lot ?Help needed standing up from a chair using your arms (e.g., wheelchair or bedside chair)?: A Lot ?Help needed to walk in hospital room?: Total ?Help needed climbing 3-5 steps with a railing? : Total ?6 Click Score: 11 ? ?  ?End of Session   ?Activity Tolerance: Patient tolerated treatment well ?Patient left: in bed;with call bell/phone within reach;with bed alarm set;with nursing/sitter in room ?Nurse Communication: Mobility status ?PT Visit Diagnosis: Unsteadiness on feet (R26.81);Repeated falls (R29.6);Muscle weakness (generalized) (M62.81);History of falling (Z91.81);Difficulty in walking, not elsewhere classified (R26.2) ?  ? ? ?Time: [id number] ?PT Time Calculation (min) (ACUTE ONLY): 24 min ? ?Charges:  $Therapeutic Activity: 23-37 mins          ?Julaine Fusi PTA ?05/09/21, 4:25 PM  ? ?

## 2021-05-09 NOTE — Progress Notes (Signed)
? ? ? ?Progress Note  ? ? ?Charles Schmitt.  NTZ:[id number] DOB: 08-30-1955  DOA: 05/03/2021 ?PCP: Pcp, No  ? ? ? ? ?Brief Narrative:  ? ? ?Medical records reviewed and are as summarized below: ? ?Charles Schmitt. is a 66 y.o. male who was brought to the hospital because of multiple falls due to alcohol intoxication.  He was found to have acute subdural hematoma. ? ? ? ? ? ? ?Assessment/Plan:  ? ?Principal Problem: ?  SAH (subarachnoid hemorrhage) (HCC) ?Active Problems: ?  Subdural hematoma ? ? ?Nutrition Problem: Severe Malnutrition ?Etiology: chronic illness (ETOH abuse) ? ?Signs/Symptoms: moderate fat depletion, severe fat depletion, moderate muscle depletion, severe muscle depletion ? ? ?Body mass index is 16.05 kg/m?. ? ?Acute traumatic left subdural hematoma, bilateral subarachnoid hemorrhage, intraparenchymal hemorrhagic contusion, multiple falls: ?Neurosurgery recommended conservative management.  Outpatient follow-up with neurosurgery.  Continue fall precautions.  PT and OT recommended discharge to SNF.  ? ? ?Alcohol use disorder ?Continue Librium taper.  Use Ativan as needed for agitation/withdrawal syndrome. ? ? ?Hypokalemia, hypoglycemia ?Glucose levels have been stable.  Potassium is 3.3 today.  Replete potassium.  Magnesium is borderline normal.  Replete magnesium hospital and monitor levels. ? ? ?Mildly elevated liver enzymes may be from alcohol use disorder ? ?Other comorbidities include chronic diastolic CHF, paroxysmal atrial fibrillation, ischemic cardiomyopathy, hypertension ? ?Diet Order   ? ?       ?  DIET DYS 3 Room service appropriate? Yes with Assist; Fluid consistency: Thin  Diet effective now       ?  ? ?  ?  ? ?  ? ? ? ?Consultants: ?Neurosurgeon ? ?Procedures: ?None ? ? ? ?Medications:  ? ? (feeding supplement) PROSource Plus  30 mL Oral TID BM  ? chlordiazePOXIDE  25 mg Oral BH-qamhs  ? Followed by  ? [START ON 05/10/2021] chlordiazePOXIDE  25 mg Oral Daily  ? Chlorhexidine Gluconate  Cloth  6 each Topical Daily  ? docusate sodium  100 mg Oral BID  ? feeding supplement  237 mL Oral TID BM  ? folic acid  1 mg Oral Daily  ? lidocaine  1 patch Transdermal Q24H  ? metoprolol succinate  12.5 mg Oral Daily  ? multivitamin with minerals  1 tablet Oral Daily  ? polyethylene glycol  17 g Oral Daily  ? potassium chloride  40 mEq Oral Q4H  ? [START ON 05/12/2021] thiamine injection  100 mg Intravenous Daily  ? Or  ? [START ON 05/12/2021] thiamine  100 mg Oral Daily  ? thiamine  250 mg Oral Daily  ? ?Continuous Infusions: ? sodium chloride 10 mL/hr at 05/09/21 1047  ? ? ? ?Anti-infectives (From admission, onward)  ? ? None  ? ?  ? ? ? ? ? ? ? ? ? ?Family Communication/Anticipated D/C date and plan/Code Status  ? ?DVT prophylaxis: SCDs Start: 05/03/21 1912 ? ? ?  Code Status: Full Code ? ?Family Communication: None ?Disposition Plan: Plan to discharge to SNF ? ? ?Status is: Inpatient ?Remains inpatient appropriate because: Unsafe discharge, awaiting placement to SNF ? ? ? ? ? ? ?Subjective:  ? ?He complains of fatigue and general weakness.  He said he did not have enough sleep last night.  Marylene Land, RN, was at the bedside ? ?Objective:  ? ? ?Vitals:  ? 05/09/21 0500 05/09/21 0600 05/09/21 0800 05/09/21 0855  ?BP:   (!) 137/103   ?Pulse:  (!) 102 96   ?Resp: 15 20 (!)  21   ?Temp:    97.6 ?F (36.4 ?C)  ?TempSrc:    Oral  ?SpO2:  95% 95%   ?Weight:      ?Height:      ? ?No data found. ? ? ?Intake/Output Summary (Last 24 hours) at 05/09/2021 1158 ?Last data filed at 05/09/2021 0200 ?Gross per 24 hour  ?Intake --  ?Output 1225 ml  ?Net -1225 ml  ? ?Filed Weights  ? 05/07/21 0420 05/08/21 0331 05/09/21 0320  ?Weight: 62.3 kg 62.8 kg 61.4 kg  ? ? ?Exam: ? ?GEN: NAD ?SKIN: Warm and dry ?EYES: No pallor or icterus ?ENT: MMM ?CV: RRR ?PULM: CTA B ?ABD: soft, ND, NT, +BS ?CNS: AAO x 3, non focal ?EXT: No edema or tenderness ? ? ? ? ? ?Data Reviewed:  ? ?I have personally reviewed following labs and imaging  studies: ? ?Labs: ?Labs show the following:  ? ?Basic Metabolic Panel: ?Recent Labs  ?Lab 05/05/21 ?0223 05/06/21 ?0419 05/07/21 ?0559 05/08/21 ?0630 05/09/21 ?7672  ?NA 142 138 138 140 140  ?K 4.2 3.3* 3.5 3.8 3.3*  ?CL 101 101 104 105 106  ?CO2 31 29 27 27 26   ?GLUCOSE 146* 93 109* 98 114*  ?BUN 9 9 10 8 9   ?CREATININE 0.92 0.96 0.83 0.73 0.68  ?CALCIUM 8.7* 8.4* 8.4* 8.8* 8.5*  ?MG 1.8 2.1 2.0 2.0 1.7  ?PHOS 3.1 2.3* 3.2 3.8 3.9  ? ?GFR ?Estimated Creatinine Clearance: 82.1 mL/min (by C-G formula based on SCr of 0.68 mg/dL). ?Liver Function Tests: ?Recent Labs  ?Lab 05/03/21 ?1937 05/04/21 ?0506  ?AST 47* 44*  ?ALT 24 22  ?ALKPHOS 127* 110  ?BILITOT 1.1 1.2  ?PROT 7.6 6.5  ?ALBUMIN 3.3* 3.1*  ? ?No results for input(s): LIPASE, AMYLASE in the last 168 hours. ?No results for input(s): AMMONIA in the last 168 hours. ?Coagulation profile ?Recent Labs  ?Lab 05/03/21 ?1937 05/04/21 ?0724  ?INR 1.0 1.1  ? ? ?CBC: ?Recent Labs  ?Lab 05/03/21 ?1937 05/04/21 ?0506 05/05/21 ?05/06/21 05/07/21 ?1412 05/08/21 ?0630  ?WBC 4.8 4.1 2.0* 3.2* 3.8*  ?HGB 11.9* 9.8* 10.4* 9.4* 10.1*  ?HCT 35.2* 28.6* 30.8* 28.4* 30.2*  ?MCV 98.1 98.3 98.1 100.4* 99.7  ?PLT 115* 106* 102* 142* 182  ? ?Cardiac Enzymes: ?No results for input(s): CKTOTAL, CKMB, CKMBINDEX, TROPONINI in the last 168 hours. ?BNP (last 3 results) ?No results for input(s): PROBNP in the last 8760 hours. ?CBG: ?Recent Labs  ?Lab 05/08/21 ?1932 05/08/21 ?2320 05/09/21 ?0323 05/09/21 ?0744 05/09/21 ?1152  ?GLUCAP 102* 130* 129* 122* 107*  ? ?D-Dimer: ?No results for input(s): DDIMER in the last 72 hours. ?Hgb A1c: ?No results for input(s): HGBA1C in the last 72 hours. ?Lipid Profile: ?No results for input(s): CHOL, HDL, LDLCALC, TRIG, CHOLHDL, LDLDIRECT in the last 72 hours. ?Thyroid function studies: ?No results for input(s): TSH, T4TOTAL, T3FREE, THYROIDAB in the last 72 hours. ? ?Invalid input(s): FREET3 ?Anemia work up: ?No results for input(s): VITAMINB12, FOLATE, FERRITIN,  TIBC, IRON, RETICCTPCT in the last 72 hours. ?Sepsis Labs: ?Recent Labs  ?Lab 05/04/21 ?0506 05/05/21 ?0223 05/07/21 ?1412 05/08/21 ?0630  ?WBC 4.1 2.0* 3.2* 3.8*  ? ? ?Microbiology ?Recent Results (from the past 240 hour(s))  ?Resp Panel by RT-PCR (Flu A&B, Covid) Nasopharyngeal Swab     Status: None  ? Collection Time: 05/03/21  7:37 PM  ? Specimen: Nasopharyngeal Swab; Nasopharyngeal(NP) swabs in vial transport medium  ?Result Value Ref Range Status  ? SARS Coronavirus 2 by RT PCR  NEGATIVE NEGATIVE Final  ?  Comment: (NOTE) ?SARS-CoV-2 target nucleic acids are NOT DETECTED. ? ?The SARS-CoV-2 RNA is generally detectable in upper respiratory ?specimens during the acute phase of infection. The lowest ?concentration of SARS-CoV-2 viral copies this assay can detect is ?138 copies/mL. A negative result does not preclude SARS-Cov-2 ?infection and should not be used as the sole basis for treatment or ?other patient management decisions. A negative result may occur with  ?improper specimen collection/handling, submission of specimen other ?than nasopharyngeal swab, presence of viral mutation(s) within the ?areas targeted by this assay, and inadequate number of viral ?copies(<138 copies/mL). A negative result must be combined with ?clinical observations, patient history, and epidemiological ?information. The expected result is Negative. ? ?Fact Sheet for Patients:  ?BloggerCourse.comhttps://www.fda.gov/media/152166/download ? ?Fact Sheet for Healthcare Providers:  ?SeriousBroker.ithttps://www.fda.gov/media/152162/download ? ?This test is no t yet approved or cleared by the [place] FDA and  ?has been authorized for detection and/or diagnosis of SARS-CoV-2 by ?FDA under an Emergency Use Authorization (EUA). This EUA will remain  ?in effect (meaning this test can be used) for the duration of the ?COVID-19 declaration under Section 564(b)(1) of the Act, 21 ?U.S.C.section 360bbb-3(b)(1), unless the authorization is terminated  ?or revoked sooner.  ? ? ?   ? Influenza A by PCR NEGATIVE NEGATIVE Final  ? Influenza B by PCR NEGATIVE NEGATIVE Final  ?  Comment: (NOTE) ?The Xpert Xpress SARS-CoV-2/FLU/RSV plus assay is intended as an aid ?in the diagnosis of [facility]

## 2021-05-10 LAB — BASIC METABOLIC PANEL
Anion gap: 6 (ref 5–15)
BUN: 11 mg/dL (ref 8–23)
CO2: 26 mmol/L (ref 22–32)
Calcium: 8.9 mg/dL (ref 8.9–10.3)
Chloride: 106 mmol/L (ref 98–111)
Creatinine, Ser: 0.77 mg/dL (ref 0.61–1.24)
GFR, Estimated: >60 mL/min (ref >60)
Glucose, Bld: 102 mg/dL — ABNORMAL HIGH (ref 70–99)
Potassium: 4.6 mmol/L (ref 3.5–5.1)
Sodium: 138 mmol/L (ref 135–145)

## 2021-05-10 LAB — GLUCOSE, CAPILLARY
Glucose-Capillary: 113 mg/dL — ABNORMAL HIGH (ref 70–99)
Glucose-Capillary: 81 mg/dL (ref 70–99)
Glucose-Capillary: 99 mg/dL (ref 70–99)

## 2021-05-10 LAB — PHOSPHORUS: Phosphorus: 3 mg/dL (ref 2.5–4.6)

## 2021-05-10 LAB — MAGNESIUM: Magnesium: 2.4 mg/dL (ref 1.7–2.4)

## 2021-05-10 NOTE — Progress Notes (Signed)
Called family back and provided update ?

## 2021-05-10 NOTE — Progress Notes (Signed)
? ? ? ?Progress Note  ? ? ?[name]  RUE:[id number] DOB: [birth year]  DOA: 05/03/2021 ?PCP: Pcp, No  ? ? ? ? ?Brief Narrative:  ? ? ?Medical records reviewed and are as summarized below: ? ?Charles Schmitt. is a 66 y.o. male who was brought to the hospital because of multiple falls due to alcohol intoxication.  He was found to have acute subdural hematoma. ? ? ? ? ? ? ?Assessment/Plan:  ? ?Principal Problem: ?  SAH (subarachnoid hemorrhage) (HCC) ?Active Problems: ?  Subdural hematoma (HCC) ? ? ?Nutrition Problem: Severe Malnutrition ?Etiology: chronic illness (ETOH abuse) ? ?Signs/Symptoms: moderate fat depletion, severe fat depletion, moderate muscle depletion, severe muscle depletion ? ? ?Body mass index is 15.74 kg/m?. ? ?Acute traumatic left subdural hematoma, bilateral subarachnoid hemorrhage, intraparenchymal hemorrhagic contusion, multiple falls: ?Neurosurgery recommended conservative management.  Outpatient follow-up with neurosurgery.  Continue fall precautions.  PT and OT recommended discharge to SNF.  ? ? ?Alcohol use disorder ?Complete Librium taper tomorrow.  Use Ativan as needed for agitation/withdrawal syndrome. ? ? ?Hypokalemia, hypoglycemia ?Improved ? ?Mildly elevated liver enzymes may be from alcohol use disorder ? ?Other comorbidities include chronic diastolic CHF, paroxysmal atrial fibrillation, ischemic cardiomyopathy, hypertension ? ?Diet Order   ? ?       ?  DIET DYS 3 Room service appropriate? Yes with Assist; Fluid consistency: Thin  Diet effective now       ?  ? ?  ?  ? ?  ? ? ? ?Consultants: ?Neurosurgeon ? ?Procedures: ?None ? ? ? ?Medications:  ? ? (feeding supplement) PROSource Plus  30 mL Oral TID BM  ? chlordiazePOXIDE  25 mg Oral Daily  ? docusate sodium  100 mg Oral BID  ? feeding supplement  237 mL Oral TID BM  ? folic acid  1 mg Oral Daily  ? lidocaine  1 patch Transdermal Q24H  ? metoprolol succinate  12.5 mg Oral Daily  ? multivitamin with minerals  1 tablet Oral  Daily  ? polyethylene glycol  17 g Oral Daily  ? [START ON 05/12/2021] thiamine injection  100 mg Intravenous Daily  ? Or  ? [START ON 05/12/2021] thiamine  100 mg Oral Daily  ? thiamine  250 mg Oral Daily  ? ?Continuous Infusions: ? sodium chloride Stopped (05/09/21 1411)  ? ? ? ?Anti-infectives (From admission, onward)  ? ? None  ? ?  ? ? ? ? ? ? ? ? ? ?Family Communication/Anticipated D/C date and plan/Code Status  ? ?DVT prophylaxis: SCDs Start: 05/03/21 1912 ? ? ?  Code Status: Full Code ? ?Family Communication: None ?Disposition Plan: Plan to discharge to SNF ? ? ?Status is: Inpatient ?Remains inpatient appropriate because: Unsafe discharge, awaiting placement to SNF ? ? ? ? ? ? ?Subjective:  ? ?Interval events noted.  He said he wants to go home. He still feels weak ? ?Objective:  ? ? ?Vitals:  ? 05/09/21 2048 05/10/21 0427 05/10/21 0442 05/10/21 0730  ?BP: 137/87 122/88  (!) 146/100  ?Pulse: 94 86  81  ?Resp: 18 18  16   ?Temp: 97.9 ?F (36.6 ?C) 98.3 ?F (36.8 ?C)  97.7 ?F (36.5 ?C)  ?TempSrc:      ?SpO2: 100% 100%  100%  ?Weight:   60.2 kg   ?Height:      ? ?No data found. ? ? ?Intake/Output Summary (Last 24 hours) at 05/10/2021 1623 ?Last data filed at 05/10/2021 1130 ?Gross per 24 hour  ?Intake 288.95  ml  ?Output 850 ml  ?Net -561.05 ml  ? ?Filed Weights  ? 05/08/21 0331 05/09/21 0320 05/10/21 0442  ?Weight: 62.8 kg 61.4 kg 60.2 kg  ? ? ?Exam: ? ?GEN: NAD ?SKIN: No rash ?EYES: EOMI ?ENT: MMM ?CV: RRR ?PULM: CTA B ?ABD: soft, ND, NT, +BS ?CNS: AAO x 3, non focal ?EXT: No edema or tenderness ? ? ? ? ?Data Reviewed:  ? ?I have personally reviewed following labs and imaging studies: ? ?Labs: ?Labs show the following:  ? ?Basic Metabolic Panel: ?Recent Labs  ?Lab 05/06/21 ?0419 05/07/21 ?0559 05/08/21 ?0630 05/09/21 ?[id number] 05/10/21 ?0540  ?NA 138 138 140 140 138  ?K 3.3* 3.5 3.8 3.3* 4.6  ?CL 101 104 105 106 106  ?CO2 29 27 27 26 26   ?GLUCOSE 93 109* 98 114* 102*  ?BUN 9 10 8 9 11   ?CREATININE 0.96 0.83 0.73 0.68 0.77   ?CALCIUM 8.4* 8.4* 8.8* 8.5* 8.9  ?MG 2.1 2.0 2.0 1.7 2.4  ?PHOS 2.3* 3.2 3.8 3.9 3.0  ? ?GFR ?Estimated Creatinine Clearance: 80.5 mL/min (by C-G formula based on SCr of 0.77 mg/dL). ?Liver Function Tests: ?Recent Labs  ?Lab 05/03/21 ?1937 05/04/21 ?0506  ?AST 47* 44*  ?ALT 24 22  ?ALKPHOS 127* 110  ?BILITOT 1.1 1.2  ?PROT 7.6 6.5  ?ALBUMIN 3.3* 3.1*  ? ?No results for input(s): LIPASE, AMYLASE in the last 168 hours. ?No results for input(s): AMMONIA in the last 168 hours. ?Coagulation profile ?Recent Labs  ?Lab 05/03/21 ?1937 05/04/21 ?0724  ?INR 1.0 1.1  ? ? ?CBC: ?Recent Labs  ?Lab 05/03/21 ?1937 05/04/21 ?0506 05/05/21 ?[id number] 05/07/21 ?1412 05/08/21 ?0630  ?WBC 4.8 4.1 2.0* 3.2* 3.8*  ?HGB 11.9* 9.8* 10.4* 9.4* 10.1*  ?HCT 35.2* 28.6* 30.8* 28.4* 30.2*  ?MCV 98.1 98.3 98.1 100.4* 99.7  ?PLT 115* 106* 102* 142* 182  ? ?Cardiac Enzymes: ?No results for input(s): CKTOTAL, CKMB, CKMBINDEX, TROPONINI in the last 168 hours. ?BNP (last 3 results) ?No results for input(s): PROBNP in the last 8760 hours. ?CBG: ?Recent Labs  ?Lab 05/09/21 ?1552 05/09/21 ?1942 05/10/21 ?0013 05/10/21 ?0424 05/10/21 ?[id number]  ?GLUCAP 138* 113* 113* 99 81  ? ?D-Dimer: ?No results for input(s): DDIMER in the last 72 hours. ?Hgb A1c: ?No results for input(s): HGBA1C in the last 72 hours. ?Lipid Profile: ?No results for input(s): CHOL, HDL, LDLCALC, TRIG, CHOLHDL, LDLDIRECT in the last 72 hours. ?Thyroid function studies: ?No results for input(s): TSH, T4TOTAL, T3FREE, THYROIDAB in the last 72 hours. ? ?Invalid input(s): FREET3 ?Anemia work up: ?No results for input(s): VITAMINB12, FOLATE, FERRITIN, TIBC, IRON, RETICCTPCT in the last 72 hours. ?Sepsis Labs: ?Recent Labs  ?Lab 05/04/21 ?0506 05/05/21 ?0223 05/07/21 ?1412 05/08/21 ?0630  ?WBC 4.1 2.0* 3.2* 3.8*  ? ? ?Microbiology ?Recent Results (from the past 240 hour(s))  ?Resp Panel by RT-PCR (Flu A&B, Covid) Nasopharyngeal Swab     Status: None  ? Collection Time: 05/03/21  7:37 PM  ? Specimen:  Nasopharyngeal Swab; Nasopharyngeal(NP) swabs in vial transport medium  ?Result Value Ref Range Status  ? SARS Coronavirus 2 by RT PCR NEGATIVE NEGATIVE Final  ?  Comment: (NOTE) ?SARS-CoV-2 target nucleic acids are NOT DETECTED. ? ?The SARS-CoV-2 RNA is generally detectable in upper respiratory ?specimens during the acute phase of infection. The lowest ?concentration of SARS-CoV-2 viral copies this assay can detect is ?138 copies/mL. A negative result does not preclude SARS-Cov-2 ?infection and should not be used as the sole basis for treatment or ?other  patient management decisions. A negative result may occur with  ?improper specimen collection/handling, submission of specimen other ?than nasopharyngeal swab, presence of viral mutation(s) within the ?areas targeted by this assay, and inadequate number of viral ?copies(<138 copies/mL). A negative result must be combined with ?clinical observations, patient history, and epidemiological ?information. The expected result is Negative. ? ?Fact Sheet for Patients:  ?BloggerCourse.com ? ?Fact Sheet for Healthcare Providers:  ?SeriousBroker.it ? ?This test is no t yet approved or cleared by the [place] FDA and  ?has been authorized for detection and/or diagnosis of SARS-CoV-2 by ?FDA under an Emergency Use Authorization (EUA). This EUA will remain  ?in effect (meaning this test can be used) for the duration of the ?COVID-19 declaration under Section 564(b)(1) of the Act, 21 ?U.S.C.section 360bbb-3(b)(1), unless the authorization is terminated  ?or revoked sooner.  ? ? ?  ? Influenza A by PCR NEGATIVE NEGATIVE Final  ? Influenza B by PCR NEGATIVE NEGATIVE Final  ?  Comment: (NOTE) ?The Xpert Xpress SARS-CoV-2/FLU/RSV plus assay is intended as an aid ?in the diagnosis of influenza from Nasopharyngeal swab specimens and ?should not be used as a sole basis for treatment. Nasal washings and ?aspirates are unacceptable for  Xpert Xpress SARS-CoV-2/FLU/RSV ?testing. ? ?Fact Sheet for Patients: ?BloggerCourse.com ? ?Fact Sheet for Healthcare Providers: ?SeriousBroker.it ? ?This t

## 2021-05-10 NOTE — TOC Progression Note (Signed)
Transition of Care (TOC) - Progression Note  ? ? ?Patient Details  ?Name: Bezalel Senn. ?MRN: [id number] ?Date of Birth: 1955-10-31 ? ?Transition of Care (TOC) CM/SW Contact  ?Jessamyn Watterson E Caelie R[place], LCSW ?Phone Number: ?05/10/2021, 10:22 AM ? ?Clinical Narrative:     Following for bed offers.  ? ? ? ?Expected Discharge Plan: [place] ?Barriers to Discharge: Continued Medical Work up ? ?Expected Discharge Plan and Services ?Expected Discharge Plan: [place] ?  ?Discharge Planning Services: CM Consult ?Post Acute Care Choice: [place] ?Living arrangements for the past 2 months: Mobile Home ?                ?  ?  ?  ?  ?  ?  ?  ?  ?  ?  ? ? ?Social Determinants of Health (SDOH) Interventions ?  ? ?Readmission Risk Interventions ?   ? View : No data to display.  ?  ?  ?  ? ? ?

## 2021-05-10 NOTE — Plan of Care (Signed)
?  Problem: Education: ?Goal: Knowledge of General Education information will improve ?Description: Including pain rating scale, medication(s)/side effects and non-pharmacologic comfort measures ?Outcome: Progressing ?  ?Problem: Health Behavior/Discharge Planning: ?Goal: Ability to manage health-related needs will improve ?Outcome: Progressing ?  ?Problem: Clinical Measurements: ?Goal: Ability to maintain clinical measurements within normal limits will improve ?Outcome: Progressing ?Goal: Will remain free from infection ?Outcome: Progressing ?Goal: Diagnostic test results will improve ?Outcome: Progressing ?Goal: Respiratory complications will improve ?Outcome: Progressing ?Goal: Cardiovascular complication will be avoided ?Outcome: Progressing ?  ?Problem: Nutrition: ?Goal: Adequate nutrition will be maintained ?Outcome: Progressing ?  ?Problem: Coping: ?Goal: Level of anxiety will decrease ?Outcome: Progressing ?  ?Problem: Elimination: ?Goal: Will not experience complications related to bowel motility ?Outcome: Progressing ?Goal: Will not experience complications related to urinary retention ?Outcome: Progressing ?  ?Problem: Pain Managment: ?Goal: General experience of comfort will improve ?Outcome: Progressing ?  ?Problem: Safety: ?Goal: Ability to remain free from injury will improve ?Outcome: Progressing ?  ?Problem: Skin Integrity: ?Goal: Risk for impaired skin integrity will decrease ?Outcome: Progressing ?  ?Problem: Education: ?Goal: Knowledge of secondary prevention will improve (SELECT ALL) ?Outcome: Progressing ?  ?

## 2021-05-10 NOTE — Progress Notes (Signed)
Speech Language Pathology Treatment: Cognitive-Linquistic  ?Patient Details ?Name: Bharath Bernstein. ?MRN: [id number] ?DOB: 04/05/1955 ?Today's Date: 05/10/2021 ?Time: 1010-1027 ?SLP Time Calculation (min) (ACUTE ONLY): 17 min ? ?Assessment / Plan / Recommendation ?Clinical Impression ? Skilled treatment session focused on pt formally evaluating pt's cognitive abilities. Pt continues to present with skills commensurate of [place] Level VI - confused/appropriate. Formally, pt demonstrates good ability with basic yes/no questions, selective attention to basic tasks, follows 1-step directions, able to follow 2 step directions related to himself with extra time. 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 Concerning that pt provides very specific information regarding number of cows as well as breed. 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 Given lack of cognitive improved over course of admission, it is likely that pt will require 24 hour assistance at discharge. He may be at his new cognitive baseline.  ? ?  ?HPI HPI: Pt is a [name] presenting to [facility] ED from home via EMS after heavy drinking these past 2 weeks and multiple falls at home. The patient is unsure why he needed to come to the hospital, but does admit he fell out of bed and had difficulty getting up this morning because of weakness in his legs. Several CT scans showing traumatic SDH, SAH, and midline shift.  He reports he had a couple of shots of vodka the day of admit.  His family reports his alcohol intake is significant,  potentially a half gallon a day.  Recent report  that he has been vomiting and not eating any solid food for the past 3 weeks or so.  Head CTs 05/03/2021-Today at this evaluation: Unchanged right subdural hematoma with mass effect and 4 mm of  right-to-left midline shift.  2. Unchanged subarachnoid blood in the right sylvian fissure and  right superior frontal lobe hemorrhagic contusions.  3. Unchanged small volume of subdural hemorrhage over the vertex  left frontal lobe, without significant mass effect. ?  ?   ?SLP Plan ? All goals met ? ?  ?  ?Recommendations for follow up therapy are one component of a multi-disciplinary discharge planning process, led by the attending physician.  Recommendations may be updated based on patient status, additional functional criteria and insurance authorization. ?  ?   ?   ?   ?   ? ? ? ? Follow Up Recommendations: Skilled nursing-short term rehab (<3 hours/day) ?Assistance recommended at discharge: Frequent or constant Supervision/Assistance ?SLP Visit Diagnosis: Cognitive communication deficit (R41.841) ?Plan: All goals met ? ? ? ? ?  ?  ? ?Zian Mohamed B. Rutherford Nail, M.S., CCC-SLP, CBIS ?Speech-Language Pathologist ?Rehabilitation Services ?Office [phone] ? ?Vern Prestia ? ?05/10/2021, 3:33 PM ?

## 2021-05-10 NOTE — Consult Note (Signed)
PHARMACY CONSULT NOTE - FOLLOW UP ? ?Pharmacy Consult for Electrolyte Monitoring and Replacement  ? ?Recent Labs: ?Potassium (mmol/L)  ?Date Value  ?05/10/2021 4.6  ? ?Magnesium (mg/dL)  ?Date Value  ?05/10/2021 2.4  ? ?Calcium (mg/dL)  ?Date Value  ?05/10/2021 8.9  ? ?Albumin (g/dL)  ?Date Value  ?05/04/2021 3.1 (L)  ?03/03/2021 3.0 (L)  ? ?Phosphorus (mg/dL)  ?Date Value  ?05/10/2021 3.0  ? ?Sodium (mmol/L)  ?Date Value  ?05/10/2021 138  ?03/03/2021 145 (H)  ? ? ?Assessment: ?66yo M presents to ED with complaint of increased falls last couple of months, most recent being two days ago. PMH includes atrial fibrillation and HFrEF. Patient also reports increased alcohol intake. 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 Pharmacy consulted for electrolyte monitoring and replacement.  ? ?Goal of Therapy:  ?Electrolytes WNL ?Goal K 4.0  ? ?Plan:  ?No replacement needed at this time.  ?F/u with AM labs.  ? ? ? ?Ronnald Ramp, PharmD ?05/10/2021 ?8:24 AM' ?

## 2021-05-11 LAB — BASIC METABOLIC PANEL
Anion gap: 9 (ref 5–15)
BUN: 15 mg/dL (ref 8–23)
CO2: 25 mmol/L (ref 22–32)
Calcium: 9.1 mg/dL (ref 8.9–10.3)
Chloride: 105 mmol/L (ref 98–111)
Creatinine, Ser: 0.78 mg/dL (ref 0.61–1.24)
GFR, Estimated: >60 mL/min (ref >60)
Glucose, Bld: 119 mg/dL — ABNORMAL HIGH (ref 70–99)
Potassium: 4 mmol/L (ref 3.5–5.1)
Sodium: 139 mmol/L (ref 135–145)

## 2021-05-11 LAB — PHOSPHORUS: Phosphorus: 3.4 mg/dL (ref 2.5–4.6)

## 2021-05-11 LAB — MAGNESIUM: Magnesium: 2.4 mg/dL (ref 1.7–2.4)

## 2021-05-11 MED ORDER — NICOTINE 21 MG/24HR TD PT24
21.0000 mg | MEDICATED_PATCH | Freq: Every day | TRANSDERMAL | Status: DC
  Administered 2021-05-11 – 2021-05-12 (×2): 21 mg via TRANSDERMAL
  Filled 2021-05-11 (×2): qty 1

## 2021-05-11 NOTE — Consult Note (Signed)
PHARMACY CONSULT NOTE ? ?Pharmacy Consult for Electrolyte Monitoring and Replacement  ? ?Recent Labs: ?Potassium (mmol/L)  ?Date Value  ?05/11/2021 4.0  ? ?Magnesium (mg/dL)  ?Date Value  ?05/11/2021 2.4  ? ?Calcium (mg/dL)  ?Date Value  ?05/11/2021 9.1  ? ?Albumin (g/dL)  ?Date Value  ?05/04/2021 3.1 (L)  ?03/03/2021 3.0 (L)  ? ?Phosphorus (mg/dL)  ?Date Value  ?05/11/2021 3.4  ? ?Sodium (mmol/L)  ?Date Value  ?05/11/2021 139  ?03/03/2021 145 (H)  ? ? ?Assessment: ?66yo M presents to ED with complaint of increased falls last couple of months, most recent being two days ago. PMH includes atrial fibrillation and HFrEF. Patient also reports increased alcohol intake. 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 Pharmacy consulted for electrolyte monitoring and replacement.  ? ?Goal of Therapy:  ?Electrolytes WNL ?Goal K 4.0  ? ?Plan:  ?--No replacement needed at this time ?--Patient care transferred from PCCM to [facility]. Will discontinue electrolyte consult at this time. Further ordering of labs and electrolyte replacement per primary team ?--Pharmacy will continue to monitor peripherally ? ?Tressie Ellis ?05/11/2021 ?7:40 AM' ?

## 2021-05-11 NOTE — Progress Notes (Signed)
? ? ? ?Progress Note  ? ? ?Charles Schmitt.  HWE:[id number] DOB: 04-07-1955  DOA: 05/03/2021 ?PCP: Pcp, No  ? ? ? ? ?Brief Narrative:  ? ? ?Medical records reviewed and are as summarized below: ? ?Charles Schmitt. is a 66 y.o. male who was brought to the hospital because of multiple falls due to alcohol intoxication.  He was found to have acute subdural hematoma. ? ? ? ? ? ? ?Assessment/Plan:  ? ?Principal Problem: ?  SAH (subarachnoid hemorrhage) (HCC) ?Active Problems: ?  Subdural hematoma (HCC) ? ? ?Nutrition Problem: Severe Malnutrition ?Etiology: chronic illness (ETOH abuse) ? ?Signs/Symptoms: moderate fat depletion, severe fat depletion, moderate muscle depletion, severe muscle depletion ? ? ?Body mass index is 15.4 kg/m?. ? ?Acute traumatic left subdural hematoma, bilateral subarachnoid hemorrhage, intraparenchymal hemorrhagic contusion, multiple falls: ?Neurosurgery recommended conservative management.  Outpatient follow-up with neurosurgery.  Continue fall precautions.  PT and OT recommended discharge to SNF.  ? ? ?Alcohol use disorder ?Completed Librium.  Discontinue CIWA protocol.   ? ? ?Hypokalemia, hypoglycemia ?Improved ? ?Mildly elevated liver enzymes may be from alcohol use disorder ? ?Other comorbidities include chronic diastolic CHF, paroxysmal atrial fibrillation, ischemic cardiomyopathy, hypertension. ? ?Continue current medications as listed below. ? ? ?Diet Order   ? ?       ?  DIET DYS 3 Room service appropriate? Yes with Assist; Fluid consistency: Thin  Diet effective now       ?  ? ?  ?  ? ?  ? ? ? ?Consultants: ?Neurosurgeon ? ?Procedures: ?None ? ? ? ?Medications:  ? ? (feeding supplement) PROSource Plus  30 mL Oral TID BM  ? docusate sodium  100 mg Oral BID  ? feeding supplement  237 mL Oral TID BM  ? folic acid  1 mg Oral Daily  ? lidocaine  1 patch Transdermal Q24H  ? metoprolol succinate  12.5 mg Oral Daily  ? multivitamin with minerals  1 tablet Oral Daily  ? nicotine  21 mg  Transdermal Daily  ? polyethylene glycol  17 g Oral Daily  ? [START ON 05/12/2021] thiamine injection  100 mg Intravenous Daily  ? Or  ? [START ON 05/12/2021] thiamine  100 mg Oral Daily  ? thiamine  250 mg Oral Daily  ? ?Continuous Infusions: ? sodium chloride Stopped (05/09/21 1411)  ? ? ? ?Anti-infectives (From admission, onward)  ? ? None  ? ?  ? ? ? ? ? ? ? ? ? ?Family Communication/Anticipated D/C date and plan/Code Status  ? ?DVT prophylaxis: SCDs Start: 05/03/21 1912 ? ? ?  Code Status: Full Code ? ?Family Communication: None ?Disposition Plan: Plan to discharge to SNF ? ? ?Status is: Inpatient ?Remains inpatient appropriate because: Unsafe discharge, awaiting placement to SNF ? ? ? ? ? ? ?Subjective:  ? ?Interval events noted.  He has no complaints. ? ?Objective:  ? ? ?Vitals:  ? 05/11/21 0447 05/11/21 0803 05/11/21 1300 05/11/21 1542  ?BP:  108/87 (!) 154/86 125/85  ?Pulse:  83 95 83  ?Resp:  18 20 17   ?Temp:  98 ?F (36.7 ?C) 98.2 ?F (36.8 ?C)   ?TempSrc:   Oral   ?SpO2:  99% 95% 100%  ?Weight: 58.9 kg     ?Height:      ? ?No data found. ? ? ?Intake/Output Summary (Last 24 hours) at 05/11/2021 1639 ?Last data filed at 05/11/2021 1400 ?Gross per 24 hour  ?Intake 707 ml  ?Output 1350 ml  ?  Net -643 ml  ? ?Filed Weights  ? 05/09/21 0320 05/10/21 0442 05/11/21 0447  ?Weight: 61.4 kg 60.2 kg 58.9 kg  ? ? ?Exam: ? ?GEN: NAD ?SKIN: No rash ?EYES: EOMI ?ENT: MMM ?CV: RRR ?PULM: CTA B ?ABD: soft, ND, NT, +BS ?CNS: AAO x 3, non focal ?EXT: No edema or tenderness ? ? ? ? ? ?Data Reviewed:  ? ?I have personally reviewed following labs and imaging studies: ? ?Labs: ?Labs show the following:  ? ?Basic Metabolic Panel: ?Recent Labs  ?Lab 05/07/21 ?0559 05/08/21 ?0630 05/09/21 ?3903 05/10/21 ?0092 05/11/21 ?0454  ?NA 138 140 140 138 139  ?K 3.5 3.8 3.3* 4.6 4.0  ?CL 104 105 106 106 105  ?CO2 27 27 26 26 25   ?GLUCOSE 109* 98 114* 102* 119*  ?BUN 10 8 9 11 15   ?CREATININE 0.83 0.73 0.68 0.77 0.78  ?CALCIUM 8.4* 8.8* 8.5* 8.9 9.1   ?MG 2.0 2.0 1.7 2.4 2.4  ?PHOS 3.2 3.8 3.9 3.0 3.4  ? ?GFR ?Estimated Creatinine Clearance: 78.7 mL/min (by C-G formula based on SCr of 0.78 mg/dL). ?Liver Function Tests: ?No results for input(s): AST, ALT, ALKPHOS, BILITOT, PROT, ALBUMIN in the last 168 hours. ? ?No results for input(s): LIPASE, AMYLASE in the last 168 hours. ?No results for input(s): AMMONIA in the last 168 hours. ?Coagulation profile ?No results for input(s): INR, PROTIME in the last 168 hours. ? ? ?CBC: ?Recent Labs  ?Lab 05/05/21 ?0223 05/07/21 ?1412 05/08/21 ?0630  ?WBC 2.0* 3.2* 3.8*  ?HGB 10.4* 9.4* 10.1*  ?HCT 30.8* 28.4* 30.2*  ?MCV 98.1 100.4* 99.7  ?PLT 102* 142* 182  ? ?Cardiac Enzymes: ?No results for input(s): CKTOTAL, CKMB, CKMBINDEX, TROPONINI in the last 168 hours. ?BNP (last 3 results) ?No results for input(s): PROBNP in the last 8760 hours. ?CBG: ?Recent Labs  ?Lab 05/09/21 ?1552 05/09/21 ?1942 05/10/21 ?0013 05/10/21 ?0424 05/10/21 ?07/10/21  ?GLUCAP 138* 113* 113* 99 81  ? ?D-Dimer: ?No results for input(s): DDIMER in the last 72 hours. ?Hgb A1c: ?No results for input(s): HGBA1C in the last 72 hours. ?Lipid Profile: ?No results for input(s): CHOL, HDL, LDLCALC, TRIG, CHOLHDL, LDLDIRECT in the last 72 hours. ?Thyroid function studies: ?No results for input(s): TSH, T4TOTAL, T3FREE, THYROIDAB in the last 72 hours. ? ?Invalid input(s): FREET3 ?Anemia work up: ?No results for input(s): VITAMINB12, FOLATE, FERRITIN, TIBC, IRON, RETICCTPCT in the last 72 hours. ?Sepsis Labs: ?Recent Labs  ?Lab 05/05/21 ?0223 05/07/21 ?1412 05/08/21 ?0630  ?WBC 2.0* 3.2* 3.8*  ? ? ?Microbiology ?Recent Results (from the past 240 hour(s))  ?Resp Panel by RT-PCR (Flu A&B, Covid) Nasopharyngeal Swab     Status: None  ? Collection Time: 05/03/21  7:37 PM  ? Specimen: Nasopharyngeal Swab; Nasopharyngeal(NP) swabs in vial transport medium  ?Result Value Ref Range Status  ? SARS Coronavirus 2 by RT PCR NEGATIVE NEGATIVE Final  ?  Comment: (NOTE) ?SARS-CoV-2  target nucleic acids are NOT DETECTED. ? ?The SARS-CoV-2 RNA is generally detectable in upper respiratory ?specimens during the acute phase of infection. The lowest ?concentration of SARS-CoV-2 viral copies this assay can detect is ?138 copies/mL. A negative result does not preclude SARS-Cov-2 ?infection and should not be used as the sole basis for treatment or ?other patient management decisions. A negative result may occur with  ?improper specimen collection/handling, submission of specimen other ?than nasopharyngeal swab, presence of viral mutation(s) within the ?areas targeted by this assay, and inadequate number of viral ?copies(<138 copies/mL). A negative result must  be combined with ?clinical observations, patient history, and epidemiological ?information. The expected result is Negative. ? ?Fact Sheet for Patients:  ?BloggerCourse.comhttps://www.fda.gov/media/152166/download ? ?Fact Sheet for Healthcare Providers:  ?SeriousBroker.ithttps://www.fda.gov/media/152162/download ? ?This test is no t yet approved or cleared by the [place] FDA and  ?has been authorized for detection and/or diagnosis of SARS-CoV-2 by ?FDA under an Emergency Use Authorization (EUA). This EUA will remain  ?in effect (meaning this test can be used) for the duration of the ?COVID-19 declaration under Section 564(b)(1) of the Act, 21 ?U.S.C.section 360bbb-3(b)(1), unless the authorization is terminated  ?or revoked sooner.  ? ? ?  ? Influenza A by PCR NEGATIVE NEGATIVE Final  ? Influenza B by PCR NEGATIVE NEGATIVE Final  ?  Comment: (NOTE) ?The Xpert Xpress SARS-CoV-2/FLU/RSV plus assay is intended as an aid ?in the diagnosis of influenza from Nasopharyngeal swab specimens and ?should not be used as a sole basis for treatment. Nasal washings and ?aspirates are unacceptable for Xpert Xpress SARS-CoV-2/FLU/RSV ?testing. ? ?Fact Sheet for Patients: ?BloggerCourse.comhttps://www.fda.gov/media/152166/download ? ?Fact Sheet for Healthcare  Providers: ?SeriousBroker.ithttps://www.fda.gov/media/152162/download ? ?This test is not yet approved or cleared by the [place] FDA and ?has been authorized for detection and/or diagnosis of SARS-CoV-2 by ?FDA under an Emergency Use Authorization (EUA). This EUA will re

## 2021-05-12 ENCOUNTER — Inpatient Hospital Stay: Payer: Medicaid Other

## 2021-05-12 LAB — CBC WITH DIFFERENTIAL/PLATELET
Abs Immature Granulocytes: 0.03 10*3/uL (ref 0.00–0.07)
Abs Immature Granulocytes: 0.04 10*3/uL (ref 0.00–0.07)
Basophils Absolute: 0 10*3/uL (ref 0.0–0.1)
Basophils Absolute: 0 10*3/uL (ref 0.0–0.1)
Basophils Relative: 1 %
Basophils Relative: 1 %
Eosinophils Absolute: 0 10*3/uL (ref 0.0–0.5)
Eosinophils Absolute: 0 10*3/uL (ref 0.0–0.5)
Eosinophils Relative: 1 %
Eosinophils Relative: 0 %
HCT: 33 % — ABNORMAL LOW (ref 39.0–52.0)
HCT: 37.6 % — ABNORMAL LOW (ref 39.0–52.0)
Hemoglobin: 10.8 g/dL — ABNORMAL LOW (ref 13.0–17.0)
Hemoglobin: 12.2 g/dL — ABNORMAL LOW (ref 13.0–17.0)
Immature Granulocytes: 1 %
Immature Granulocytes: 1 %
Lymphocytes Relative: 28 %
Lymphocytes Relative: 11 %
Lymphs Abs: 1.1 10*3/uL (ref 0.7–4.0)
Lymphs Abs: 0.6 10*3/uL — ABNORMAL LOW (ref 0.7–4.0)
MCH: 33 pg (ref 26.0–34.0)
MCH: 33.2 pg (ref 26.0–34.0)
MCHC: 32.7 g/dL (ref 30.0–36.0)
MCHC: 32.4 g/dL (ref 30.0–36.0)
MCV: 100.9 fL — ABNORMAL HIGH (ref 80.0–100.0)
MCV: 102.2 fL — ABNORMAL HIGH (ref 80.0–100.0)
Monocytes Absolute: 1.1 10*3/uL — ABNORMAL HIGH (ref 0.1–1.0)
Monocytes Absolute: 1.3 10*3/uL — ABNORMAL HIGH (ref 0.1–1.0)
Monocytes Relative: 26 %
Monocytes Relative: 24 %
Neutro Abs: 1.8 10*3/uL (ref 1.7–7.7)
Neutro Abs: 3.5 10*3/uL (ref 1.7–7.7)
Neutrophils Relative %: 43 %
Neutrophils Relative %: 63 %
Platelets: 312 10*3/uL (ref 150–400)
Platelets: 310 10*3/uL (ref 150–400)
RBC: 3.27 MIL/uL — ABNORMAL LOW (ref 4.22–5.81)
RBC: 3.68 MIL/uL — ABNORMAL LOW (ref 4.22–5.81)
RDW: 14.2 % (ref 11.5–15.5)
RDW: 14.3 % (ref 11.5–15.5)
WBC: 4.1 10*3/uL (ref 4.0–10.5)
WBC: 5.5 10*3/uL (ref 4.0–10.5)
nRBC: 0 % (ref 0.0–0.2)
nRBC: 0 % (ref 0.0–0.2)

## 2021-05-12 LAB — BASIC METABOLIC PANEL
Anion gap: 8 (ref 5–15)
Anion gap: 13 (ref 5–15)
BUN: 16 mg/dL (ref 8–23)
BUN: 21 mg/dL (ref 8–23)
CO2: 28 mmol/L (ref 22–32)
CO2: 21 mmol/L — ABNORMAL LOW (ref 22–32)
Calcium: 9.4 mg/dL (ref 8.9–10.3)
Calcium: 9.4 mg/dL (ref 8.9–10.3)
Chloride: 102 mmol/L (ref 98–111)
Chloride: 103 mmol/L (ref 98–111)
Creatinine, Ser: 0.96 mg/dL (ref 0.61–1.24)
Creatinine, Ser: 1.26 mg/dL — ABNORMAL HIGH (ref 0.61–1.24)
GFR, Estimated: >60 mL/min (ref >60)
GFR, Estimated: >60 mL/min (ref >60)
Glucose, Bld: 128 mg/dL — ABNORMAL HIGH (ref 70–99)
Glucose, Bld: 169 mg/dL — ABNORMAL HIGH (ref 70–99)
Potassium: 4.2 mmol/L (ref 3.5–5.1)
Potassium: 5.8 mmol/L — ABNORMAL HIGH (ref 3.5–5.1)
Sodium: 138 mmol/L (ref 135–145)
Sodium: 137 mmol/L (ref 135–145)

## 2021-05-12 LAB — BLOOD GAS, ARTERIAL
Acid-Base Excess: 0.3 mmol/L (ref 0.0–2.0)
Bicarbonate: 21.7 mmol/L (ref 20.0–28.0)
O2 Saturation: 98.4 %
Patient temperature: 37
pCO2 arterial: 26 mmHg — ABNORMAL LOW (ref 32–48)
pH, Arterial: 7.53 — ABNORMAL HIGH (ref 7.35–7.45)
pO2, Arterial: 80 mmHg — ABNORMAL LOW (ref 83–108)

## 2021-05-12 LAB — PROCALCITONIN
Procalcitonin: 0.2 ng/mL
Procalcitonin: 0.26 ng/mL

## 2021-05-12 LAB — LACTIC ACID, PLASMA
Lactic Acid, Venous: 1.4 mmol/L (ref 0.5–1.9)
Lactic Acid, Venous: 3.6 mmol/L (ref 0.5–1.9)

## 2021-05-12 LAB — AMMONIA: Ammonia: 10 umol/L (ref 9–35)

## 2021-05-12 LAB — GLUCOSE, CAPILLARY: Glucose-Capillary: 165 mg/dL — ABNORMAL HIGH (ref 70–99)

## 2021-05-12 MED ORDER — LACTATED RINGERS IV BOLUS
250.0000 mL | Freq: Once | INTRAVENOUS | Status: AC
  Administered 2021-05-12: 250 mL via INTRAVENOUS

## 2021-05-12 MED ORDER — METRONIDAZOLE 500 MG/100ML IV SOLN
500.0000 mg | Freq: Two times a day (BID) | INTRAVENOUS | Status: DC
  Administered 2021-05-13: 500 mg via INTRAVENOUS
  Filled 2021-05-12: qty 100

## 2021-05-12 NOTE — Progress Notes (Signed)
Date and time results received: 05/12/21 2339 ?(use smartphrase ".now" to insert current time) ? ?Test: Lactic Acid ?Critical Value: 3.6 ? ?Name of Provider Notified: Ricki Miller, NP ? ?Orders Received? Or Actions Taken?:  ?LR Bolus 250 ml. CT Head. Pt is being monitor by rapid response team.  ?

## 2021-05-12 NOTE — Progress Notes (Addendum)
? ? ?  ?Name: Charles Schmitt. ?MRN: [id number] ?DOB: 1955/11/07 ?  ?  ?Subjective: Rapid response called for REW MEWS and further decompensation of mentation ?Temp 100.54F BP 100/79 (87) HR 128 RR 20 95% on RA  ? ?Objective:  ? ?Today's Vitals  ? 05/13/21 0200 05/13/21 0300 05/13/21 0321 05/13/21 0400  ?BP: (!) 130/98 (!) 163/100 (!) 124/98 (!) 127/97  ?Pulse: (!) 121 (!) 129 (!) 130 (!) 122  ?Resp: 18 18 (!) 22 (!) 24  ?Temp:  99.3 ?F (37.4 ?C) 99.1 ?F (37.3 ?C) 98.4 ?F (36.9 ?C)  ?TempSrc:    Axillary  ?SpO2: 99% 92% 94% 92%  ?Weight:      ?Height:      ?PainSc:      ? ?Body mass index is 15.5 kg/m?. ? ?DG Chest 1 View ? ?Result Date: 05/12/2021 ?CLINICAL DATA:  Respiratory distress EXAM: CHEST  1 VIEW COMPARISON:  05/03/2021 FINDINGS: Heart and mediastinal contours are within normal limits. No focal opacities or effusions. No acute bony abnormality. IMPRESSION: No active disease. Electronically Signed   By: Rolm Baptise M.D.   On: 05/12/2021 22:36  ? ?DG Abd 1 View ? ?Result Date: 05/13/2021 ?CLINICAL DATA:  Esophageal catheter placement EXAM: ABDOMEN - 1 VIEW COMPARISON:  None. FINDINGS: Esophageal catheter tip and side port project within the stomach. The side port is just beyond the gastroesophageal junction and could be advanced to ensure intragastric positioning. IMPRESSION: Esophageal catheter side port likely within the stomach, just beyond the gastroesophageal junction. This could be advanced 5 cm to ensure intragastric positioning. Electronically Signed   By: Ulyses Jarred M.D.   On: 05/13/2021 02:36  ? ?CT HEAD WO CONTRAST (5MM) ? ?Result Date: 05/13/2021 ?CLINICAL DATA:  Mental status change, unknown cause EXAM: CT HEAD WITHOUT CONTRAST TECHNIQUE: Contiguous axial images were obtained from the base of the skull through the vertex without intravenous contrast. RADIATION DOSE REDUCTION: This exam was performed according to the departmental dose-optimization program which includes automated exposure control,  adjustment of the mA and/or kV according to patient size and/or use of iterative reconstruction technique. COMPARISON:  05/12/2021 FINDINGS: Brain: Right subdural hematoma again noted measuring approximately 10 mm in thickness with 5 mm of right to left midline shift. Size of the hemorrhage stable or slightly smaller in size with stable shift. No hydrocephalus. No intraparenchymal hemorrhage. Mild chronic small vessel disease in the deep white matter. Vascular: No hyperdense vessel or unexpected calcification. Skull: No acute calvarial abnormality. Sinuses/Orbits: No acute findings Other: None IMPRESSION: Stable or slightly smaller size of the right subdural hematoma. 5 mm of right to left midline shift. Electronically Signed   By: Rolm Baptise M.D.   On: 05/13/2021 00:32  ? ?CT HEAD WO CONTRAST (5MM) ? ?Result Date: 05/12/2021 ?CLINICAL DATA:  Multiple falls.  Subdural hematoma. EXAM: CT HEAD WITHOUT CONTRAST TECHNIQUE: Contiguous axial images were obtained from the base of the skull through the vertex without intravenous contrast. RADIATION DOSE REDUCTION: This exam was performed according to the departmental dose-optimization program which includes automated exposure control, adjustment of the mA and/or kV according to patient size and/or use of iterative reconstruction technique. COMPARISON:  May 06, 2021. FINDINGS: Brain: Grossly stable size and appearance of right-sided subdural hematoma compared to prior exam. Right frontal contusions noted on prior exam are significantly improved. Subarachnoid hemorrhage seen in right sylvian fissure on prior exam appears to have nearly resolved. There is about 6 mm right left midline shift which is not  significantly changed compared to prior exam. Vascular: No hyperdense vessel or unexpected calcification. Skull: Normal. Negative for fracture or focal lesion. Sinuses/Orbits: No acute finding. Other: None. IMPRESSION: Grossly stable size and appearance of right-sided  subdural hematoma is noted compared to prior exam, with approximately 6 mm of right to left midline shift which is not significantly changed compared to prior exam. Subarachnoid hemorrhage seen in right sylvian fissure on prior exam appears to have nearly resolved. Right frontal contusions also appear to be significantly improved. Electronically Signed   By: Marijo Conception M.D.   On: 05/12/2021 16:10  ? ?CT Angio Chest Pulmonary Embolism (PE) W or WO Contrast ? ?Result Date: 05/13/2021 ?CLINICAL DATA:  Code sepsis.  Pulmonary embolism suspected EXAM: CT ANGIOGRAPHY CHEST WITH CONTRAST TECHNIQUE: Multidetector CT imaging of the chest was performed using the standard protocol during bolus administration of intravenous contrast. Multiplanar CT image reconstructions and MIPs were obtained to evaluate the vascular anatomy. RADIATION DOSE REDUCTION: This exam was performed according to the departmental dose-optimization program which includes automated exposure control, adjustment of the mA and/or kV according to patient size and/or use of iterative reconstruction technique. CONTRAST:  75mL OMNIPAQUE IOHEXOL 350 MG/ML SOLN COMPARISON:  None. FINDINGS: Cardiovascular: Satisfactory opacification of the pulmonary arteries to the segmental level. There are bands of peripheral/incorporated pulmonary artery filling defect at the right inter lobar pulmonary artery, right upper and lower lobe pulmonary arteries, and left lower lobe pulmonary artery. Some collapse of distal branches is also noted. 3.2 cm diameter main pulmonary artery trunk. Dilated ascending aorta at 4 cm. Mediastinum/Nodes: Negative for adenopathy or mass. The enteric tube tip reaches the upper stomach. Lungs/Pleura: Endotracheal tube with tip at the intrathoracic trachea. Dependent airway debris, greater on the left where there is also more extensive pulmonary opacification and volume loss. No edema, effusion, or pneumothorax. Mild cylindrical bronchiectasis in  the right lower lobe. Upper Abdomen: No acute finding. Very early opacification of the aorta. Musculoskeletal: Multilevel spondylosis. Review of the MIP images confirms the above findings. IMPRESSION: 1. Remote bilateral pulmonary emboli with webs. No acute appearing emboli. 2. Left more than right pneumonia with dependent pattern and airway debris suggesting underlying aspiration. Mild cylindrical bronchiectasis in the right lower lobe. 3. 4 cm diameter ascending aorta. Recommend annual imaging followup by CTA or MRA. This recommendation follows 2010 ACCF/AHA/AATS/ACR/ASA/SCA/SCAI/SIR/STS/SVM Guidelines for the Diagnosis and Management of Patients with Thoracic Aortic Disease. Circulation. 2010; [id number]. Aortic aneurysm NOS (ICD10-I71.9) Electronically Signed   By: Jorje Guild M.D.   On: 05/13/2021 05:32  ? ?DG Chest Port 1 View ? ?Result Date: 05/13/2021 ?CLINICAL DATA:  Intubation and orogastric tube placement EXAM: PORTABLE CHEST 1 VIEW COMPARISON:  None. FINDINGS: The heart size and mediastinal contours are within normal limits. Both lungs are clear. The visualized skeletal structures are unremarkable. Left IJ central venous catheter tip is in the lower SVC. Endotracheal tube tip is at the level of the clavicular heads. Esophageal catheter side port projects within the stomach. IMPRESSION: Endotracheal tube tip at the level of the clavicular heads. Electronically Signed   By: Ulyses Jarred M.D.   On: 05/13/2021 02:31   ? ?CBC ?   ?Component Value Date/Time  ? WBC 6.1 05/13/2021 0106  ? RBC 3.65 (L) 05/13/2021 0106  ? HGB 12.1 (L) 05/13/2021 0106  ? HGB 10.5 (L) 03/03/2021 1059  ? HCT 37.5 (L) 05/13/2021 0106  ? HCT 31.7 (L) 03/03/2021 1059  ? PLT 326 05/13/2021 0106  ?  PLT 402 03/03/2021 1059  ? MCV 102.7 (H) 05/13/2021 0106  ? MCV 98 (H) 03/03/2021 1059  ? MCH 33.2 05/13/2021 0106  ? MCHC 32.3 05/13/2021 0106  ? RDW 14.3 05/13/2021 0106  ? RDW 16.3 (H) 03/03/2021 1059  ? LYMPHSABS 0.6 (L) 05/12/2021  2243  ? MONOABS 1.3 (H) 05/12/2021 2243  ? EOSABS 0.0 05/12/2021 2243  ? BASOSABS 0.0 05/12/2021 2243  ? ? ? ?  Latest Ref Rng & Units 05/13/2021  ?  3:18 AM 05/12/2021  ? 10:27 PM 05/12/2021  ?  4:34 PM  ?CMP  ?Glu

## 2021-05-12 NOTE — Progress Notes (Signed)
Occupational Therapy Treatment ?Patient Details ?Name: Charles Schmitt. ?MRN: [id number] ?DOB: 09-Aug-1955 ?Today's Date: 05/12/2021 ? ? ?History of present illness [name] presenting to [facility] ED from home via EMS after heavy drinking these past 2 weeks and multiple falls at home. The patient is unsure why he needed to come to the hospital, but does admit he fell out of bed and had difficulty getting up this morning because of weakness in his legs. Several CT scans showing traumatic SDH, SAH, and midline shift. ?  ?OT comments ? Upon entering the room, pt seated in recliner chair and attempting to pull off lap chair alarm. Pt requesting to return to bed secondary to reports of discomfort. OT asking pt to scoot forward in chair. He was unable to initiate even with increased time. Pt asking therapist, " I can't do it. Can't you just throw me back in bed?"  Pt unable to follow commands this session. Attempts to stand with pt putting forth no effort and only rising from chair based on therapist effort and total A. He does not come to full stands. Total A for lateral scoot pivot with pt again not initiating any movement even with increased time to process and max multimodal cuing. Total A to return to bed and as soon as pt is in bed he is immediately asleep. RN and MD notified of change this session. All needs within reach and bed alarm activated for safety.   ? ?Recommendations for follow up therapy are one component of a multi-disciplinary discharge planning process, led by the attending physician.  Recommendations may be updated based on patient status, additional functional criteria and insurance authorization. ?   ?Follow Up Recommendations ? Skilled nursing-short term rehab (<3 hours/day)  ?  ?Assistance Recommended at Discharge Frequent or constant Supervision/Assistance  ?Patient can return home with the following ? Two people to help with walking and/or transfers;Two people to help with  bathing/dressing/bathroom;Assistance with cooking/housework;Help with stairs or ramp for entrance;Assist for transportation;Direct supervision/assist for medications management;Direct supervision/assist for financial management ?  ?Equipment Recommendations ? Other (comment) (defer to next venue of care)  ?  ?   ?Precautions / Restrictions Precautions ?Precautions: Fall ?Restrictions ?Weight Bearing Restrictions: No  ? ? ?  ? ?Mobility Bed Mobility ?Overal bed mobility: Needs Assistance ?Bed Mobility: Sit to Supine ?  ?  ?  ?Sit to supine: Total assist ?  ?General bed mobility comments: increased time for command following but pt does not initiate ?  ? ?Transfers ?Overall transfer level: Needs assistance ?Equipment used: 1 person hand held assist ?Transfers: Bed to chair/wheelchair/BSC ?  ?  ?  ?Step pivot transfers: Total assist ?  ?  ?General transfer comment: Pt unable to fully stand from recliner chair and requires total A for lateral scoot from chair to bed with pt giving no effort ?  ?  ?Balance Overall balance assessment: Needs assistance ?Sitting-balance support: Feet supported, Single extremity supported ?Sitting balance-Leahy Scale: Fair ?  ?  ?Standing balance support: Bilateral upper extremity supported ?Standing balance-Leahy Scale: Zero ?  ?  ?  ?  ?  ?  ?  ?  ?  ?  ?  ?  ?   ? ?ADL either performed or assessed with clinical judgement  ? ? ? ? ?Cognition Arousal/Alertness: Lethargic ?Behavior During Therapy: Flat affect ?Overall Cognitive Status: No family/caregiver present to determine baseline cognitive functioning ?Area of Impairment: Rancho level ?  ?  ?  ?  ?  ?  ?  ?  Rancho Levels of Cognitive Functioning ?[place] Mirant Scales of Cognitive Functioning: Confused/inappropriate/non-agitated ?  ?  ?  ?  ?  ?  ?  ?General Comments: Pt unable to follow commands this session and needing total A for transfer ?[place] Mirant Scales of Cognitive Functioning: Confused/inappropriate/non-agitated ?  ?    ?   ?   ?   ? ? ?Pertinent Vitals/ Pain       Pain Assessment ?Pain Assessment: Faces ?Faces Pain Scale: No hurt ? ?   ?   ? ?Frequency ? Min 3X/week  ? ? ? ? ?  ?Progress Toward Goals ? ?OT Goals(current goals can now be found in the care plan section) ? Progress towards OT goals: Progressing toward goals ? ?Acute Rehab OT Goals ?Patient Stated Goal: to go home ?OT Goal Formulation: With patient ?Time For Goal Achievement: 05/19/21 ?Potential to Achieve Goals: Fair  ?Plan Discharge plan remains appropriate;Frequency remains appropriate   ? ?   ?AM-PAC OT "6 Clicks" Daily Activity     ?Outcome Measure ? ? Help from another person eating meals?: A Little ?Help from another person taking care of personal grooming?: A Little ?Help from another person toileting, which includes using toliet, bedpan, or urinal?: Total ?Help from another person bathing (including washing, rinsing, drying)?: Total ?Help from another person to put on and taking off regular upper body clothing?: A Little ?Help from another person to put on and taking off regular lower body clothing?: Total ?6 Click Score: 12 ? ?  ?End of Session   ? ?OT Visit Diagnosis: Unsteadiness on feet (R26.81);Repeated falls (R29.6);Muscle weakness (generalized) (M62.81) ?  ?Activity Tolerance Patient limited by lethargy ?  ?Patient Left with call bell/phone within reach;with chair alarm set;in bed;with bed alarm set ?  ?Nurse Communication Mobility status;Other (comment) (lethargy) ?  ? ?   ? ?Time: 8527-7824 ?OT Time Calculation (min): 23 min ? ?Charges: OT General Charges ?$OT Visit: 1 Visit ?OT Treatments ?$Therapeutic Activity: 23-37 mins ? ?Jackquline Denmark, MS, OTR/L , CBIS ?ascom [phone]  ?05/12/21, 11:48 AM  ?

## 2021-05-12 NOTE — Progress Notes (Signed)
Inpatient Rehab Admissions Coordinator:  ? ?Pt. Remains max-total assist with PT now recommending SNF. Pt. Will need to be supervision level in order to safely return home after CIR and that does not currently appear feasible. I will continue to follow and pursue for CIR admission of pt. Progresses to the point that he could likely achieve supervision goals on CIR.  ? ?Megan Salon, MS, CCC-SLP ?Rehab Admissions Coordinator  ?[phone] (celll) ?[phone] (office) ? ?

## 2021-05-12 NOTE — Progress Notes (Addendum)
? ? ? ?Progress Note  ? ? ?Charles Schmitt.  [id number] DOB: [birth year]  DOA: 05/03/2021 ?PCP: Pcp, No  ? ? ? ? ?Brief Narrative:  ? ? ?Medical records reviewed and are as summarized below: ? ?Charles Schmitt. is a 66 y.o. male who was brought to the hospital because of multiple falls due to alcohol intoxication.  He was found to have acute subdural hematoma. ? ? ? ? ? ? ?Assessment/Plan:  ? ?Principal Problem: ?  SAH (subarachnoid hemorrhage) (HCC) ?Active Problems: ?  Subdural hematoma (HCC) ? ? ?Nutrition Problem: Severe Malnutrition ?Etiology: chronic illness (ETOH abuse) ? ?Signs/Symptoms: moderate fat depletion, severe fat depletion, moderate muscle depletion, severe muscle depletion ? ? ?Body mass index is 15.5 kg/m?. ? ?Acute traumatic left subdural hematoma, bilateral subarachnoid hemorrhage, intraparenchymal hemorrhagic contusion, multiple falls: ?Neurosurgery recommended conservative management.  Outpatient follow-up with neurosurgery.  Continue fall precautions.  PT and OT recommended discharge to SNF.  ? ? ?Altered mental status/delirium ?Repeat CT head was obtained today because patient was thought to be more drowsy/lethargic.  Left subdural hematoma is stable and left subarachnoid hemorrhage has nearly resolved.  BMP, lactic acid and procalcitonin were unremarkable.  CBC shows hemoglobin of 10.8 which is stable. ? ? ?Alcohol use disorder ?Completed Librium. ? ? ?Hypokalemia, hypoglycemia ?Improved ? ? ?Generalized weakness ?Continue PT and OT.  Discharge to SNF was recommended. ? ?Other comorbidities include chronic diastolic CHF, paroxysmal atrial fibrillation, ischemic cardiomyopathy, hypertension. ? ? ? ?Diet Order   ? ?       ?  DIET DYS 3 Room service appropriate? Yes with Assist; Fluid consistency: Thin  Diet effective now       ?  ? ?  ?  ? ?  ? ? ? ?Consultants: ?Neurosurgeon ? ?Procedures: ?None ? ? ? ?Medications:  ? ? (feeding supplement) PROSource Plus  30 mL Oral TID BM  ? docusate  sodium  100 mg Oral BID  ? feeding supplement  237 mL Oral TID BM  ? folic acid  1 mg Oral Daily  ? lidocaine  1 patch Transdermal Q24H  ? metoprolol succinate  12.5 mg Oral Daily  ? multivitamin with minerals  1 tablet Oral Daily  ? nicotine  21 mg Transdermal Daily  ? polyethylene glycol  17 g Oral Daily  ? thiamine injection  100 mg Intravenous Daily  ? Or  ? thiamine  100 mg Oral Daily  ? ?Continuous Infusions: ? sodium chloride Stopped (05/09/21 1411)  ? ? ? ?Anti-infectives (From admission, onward)  ? ? None  ? ?  ? ? ? ? ? ? ? ? ? ?Family Communication/Anticipated D/C date and plan/Code Status  ? ?DVT prophylaxis: SCDs Start: 05/03/21 1912 ? ? ?  Code Status: Full Code ? ?Family Communication: None ?Disposition Plan: Plan to discharge to SNF ? ? ?Status is: Inpatient ?Remains inpatient appropriate because: Unsafe discharge, awaiting placement to SNF ? ? ? ? ? ? ?Subjective:  ? ?He is more drowsy today.  He said he feels okay.  No headache, dizziness, chest pain or shortness of breath ? ?Objective:  ? ? ?Vitals:  ? 05/11/21 2351 05/12/21 0455 05/12/21 0500 05/12/21 1602  ?BP: 112/84 119/83  107/77  ?Pulse: 95 94  89  ?Resp: 17 16  16   ?Temp: 97.8 ?F (36.6 ?C) 97.8 ?F (36.6 ?C)  98 ?F (36.7 ?C)  ?TempSrc:    Oral  ?SpO2: 100% 100%  92%  ?Weight:   59.3 kg   ?  Height:      ? ?No data found. ? ? ?Intake/Output Summary (Last 24 hours) at 05/12/2021 1733 ?Last data filed at 05/12/2021 0500 ?Gross per 24 hour  ?Intake --  ?Output 250 ml  ?Net -250 ml  ? ?Filed Weights  ? 05/10/21 0442 05/11/21 0447 05/12/21 0500  ?Weight: 60.2 kg 58.9 kg 59.3 kg  ? ? ?Exam: ? ?GEN: NAD ?SKIN: Warm and dry ?EYES: No pallor or icterus ?ENT: MMM ?CV: RRR ?PULM: CTA B ?ABD: soft, ND, NT, +BS ?CNS: Drowsy but arousable, nonfocal. ?EXT: No edema or tenderness ? ? ? ? ? ? ?Data Reviewed:  ? ?I have personally reviewed following labs and imaging studies: ? ?Labs: ?Labs show the following:  ? ?Basic Metabolic Panel: ?Recent Labs  ?Lab  05/07/21 ?0559 05/08/21 ?0630 05/09/21 ?0263 05/10/21 ?7858 05/11/21 ?8502 05/12/21 ?1634  ?NA 138 140 140 138 139 138  ?K 3.5 3.8 3.3* 4.6 4.0 4.2  ?CL 104 105 106 106 105 102  ?CO2 27 27 26 26 25 28   ?GLUCOSE 109* 98 114* 102* 119* 128*  ?BUN 10 8 9 11 15 16   ?CREATININE 0.83 0.73 0.68 0.77 0.78 0.96  ?CALCIUM 8.4* 8.8* 8.5* 8.9 9.1 9.4  ?MG 2.0 2.0 1.7 2.4 2.4  --   ?PHOS 3.2 3.8 3.9 3.0 3.4  --   ? ?GFR ?Estimated Creatinine Clearance: 66.1 mL/min (by C-G formula based on SCr of 0.96 mg/dL). ?Liver Function Tests: ?No results for input(s): AST, ALT, ALKPHOS, BILITOT, PROT, ALBUMIN in the last 168 hours. ? ?No results for input(s): LIPASE, AMYLASE in the last 168 hours. ?Recent Labs  ?Lab 05/12/21 ?1634  ?AMMONIA 10  ? ?Coagulation profile ?No results for input(s): INR, PROTIME in the last 168 hours. ? ? ?CBC: ?Recent Labs  ?Lab 05/07/21 ?1412 05/08/21 ?0630 05/12/21 ?1634  ?WBC 3.2* 3.8* 4.1  ?NEUTROABS  --   --  1.8  ?HGB 9.4* 10.1* 10.8*  ?HCT 28.4* 30.2* 33.0*  ?MCV 100.4* 99.7 100.9*  ?PLT 142* 182 312  ? ?Cardiac Enzymes: ?No results for input(s): CKTOTAL, CKMB, CKMBINDEX, TROPONINI in the last 168 hours. ?BNP (last 3 results) ?No results for input(s): PROBNP in the last 8760 hours. ?CBG: ?Recent Labs  ?Lab 05/09/21 ?1552 05/09/21 ?1942 05/10/21 ?0013 05/10/21 ?0424 05/10/21 ?07/10/21  ?GLUCAP 138* 113* 113* 99 81  ? ?D-Dimer: ?No results for input(s): DDIMER in the last 72 hours. ?Hgb A1c: ?No results for input(s): HGBA1C in the last 72 hours. ?Lipid Profile: ?No results for input(s): CHOL, HDL, LDLCALC, TRIG, CHOLHDL, LDLDIRECT in the last 72 hours. ?Thyroid function studies: ?No results for input(s): TSH, T4TOTAL, T3FREE, THYROIDAB in the last 72 hours. ? ?Invalid input(s): FREET3 ?Anemia work up: ?No results for input(s): VITAMINB12, FOLATE, FERRITIN, TIBC, IRON, RETICCTPCT in the last 72 hours. ?Sepsis Labs: ?Recent Labs  ?Lab 05/07/21 ?1412 05/08/21 ?0630 05/12/21 ?1634  ?PROCALCITON  --   --  0.20  ?WBC  3.2* 3.8* 4.1  ?LATICACIDVEN  --   --  1.4  ? ? ?Microbiology ?Recent Results (from the past 240 hour(s))  ?Resp Panel by RT-PCR (Flu A&B, Covid) Nasopharyngeal Swab     Status: None  ? Collection Time: 05/03/21  7:37 PM  ? Specimen: Nasopharyngeal Swab; Nasopharyngeal(NP) swabs in vial transport medium  ?Result Value Ref Range Status  ? SARS Coronavirus 2 by RT PCR NEGATIVE NEGATIVE Final  ?  Comment: (NOTE) ?SARS-CoV-2 target nucleic acids are NOT DETECTED. ? ?The SARS-CoV-2 RNA is generally detectable in  upper respiratory ?specimens during the acute phase of infection. The lowest ?concentration of SARS-CoV-2 viral copies this assay can detect is ?138 copies/mL. A negative result does not preclude SARS-Cov-2 ?infection and should not be used as the sole basis for treatment or ?other patient management decisions. A negative result may occur with  ?improper specimen collection/handling, submission of specimen other ?than nasopharyngeal swab, presence of viral mutation(s) within the ?areas targeted by this assay, and inadequate number of viral ?copies(<138 copies/mL). A negative result must be combined with ?clinical observations, patient history, and epidemiological ?information. The expected result is Negative. ? ?Fact Sheet for Patients:  ?BloggerCourse.comhttps://www.fda.gov/media/152166/download ? ?Fact Sheet for Healthcare Providers:  ?SeriousBroker.ithttps://www.fda.gov/media/152162/download ? ?This test is no t yet approved or cleared by the [place] FDA and  ?has been authorized for detection and/or diagnosis of SARS-CoV-2 by ?FDA under an Emergency Use Authorization (EUA). This EUA will remain  ?in effect (meaning this test can be used) for the duration of the ?COVID-19 declaration under Section 564(b)(1) of the Act, 21 ?U.S.C.section 360bbb-3(b)(1), unless the authorization is terminated  ?or revoked sooner.  ? ? ?  ? Influenza A by PCR NEGATIVE NEGATIVE Final  ? Influenza B by PCR NEGATIVE NEGATIVE Final  ?  Comment: (NOTE) ?The  Xpert Xpress SARS-CoV-2/FLU/RSV plus assay is intended as an aid ?in the diagnosis of influenza from Nasopharyngeal swab specimens and ?should not be used as a sole basis for treatment. Nasal washings and ?aspirate

## 2021-05-12 NOTE — Progress Notes (Signed)
Rapid Response Event Note  ? ?Reason for Call : unresponsiveness ? ? ?Initial Focused Assessment: On my arrival pt is in bed with eyes closed. Pt is completely unresponsive to all stimuli, even pain. Pupils are equal and reactive. According to primary RN, pt had become more lethargic throughout the day and head CT was performed, showing improvement. VS are stable at this time on room air, pt is tachy in 130s. CBG WNL.  ? ? ?Interventions: CBG and 12 lead EKG performed. Neuro assessment performed, pt unresponsive. Bishop Limbo, NP notified and arrived to bedside. ABG, chest xray, and labs ordered. RT at bedside drawing ABG.  ? ? ?Plan of Care: Pt will remain in 108 at this time. Provider waiting on labs and xray. Orpha Bur, NP does state pt at high risk for transfer if any further decline in VS or ability to protect airway.  ? ? ?Event Summary:  ? ?MD Notified: Bishop Limbo ?Call Time: 2154 ?Arrival Time: 2156 ?End Time: 2215 ? ?Henrene Dodge, RN ?

## 2021-05-12 NOTE — Progress Notes (Signed)
Physical Therapy Treatment ?Patient Details ?Name: Charles Schmitt. ?MRN: [id number] ?DOB: [birth year] ?Today's Date: 05/12/2021 ? ? ?History of Present Illness [name] presenting to [facility] ED from home via EMS after heavy drinking these past 2 weeks and multiple falls at home. The patient is unsure why he needed to come to the hospital, but does admit he fell out of bed and had difficulty getting up this morning because of weakness in his legs. Several CT scans showing traumatic SDH, SAH, and midline shift. ? ?  ?PT Comments  ? ? Pt is not making progress towards goals with inability to perform OOB mobility this date due to hyper-focus on wanting to drink vodka. Pt alert to self and place and understands he can't have alcohol, however then repeatedly requests alcohol 10 seconds later. Offered liquids that were on tray including tea and water and pt refuses. Lunch untouched and pt reporting he isn't hungry. Attempted several time to re-orient and perform meaningful there-ex as pt is unsafe for OOB this date. Concerns relayed to RN. Will continue to progress as able.  ?Recommendations for follow up therapy are one component of a multi-disciplinary discharge planning process, led by the attending physician.  Recommendations may be updated based on patient status, additional functional criteria and insurance authorization. ? ?Follow Up Recommendations ? Skilled nursing-short term rehab (<3 hours/day) ?  ?  ?Assistance Recommended at Discharge Frequent or constant Supervision/Assistance  ?Patient can return home with the following Two people to help with walking and/or transfers;A lot of help with bathing/dressing/bathroom;Direct supervision/assist for medications management;Direct supervision/assist for financial management;Assist for transportation;Help with stairs or ramp for entrance ?  ?Equipment Recommendations ?  (TBD)  ?  ?Recommendations for Other Services   ? ? ?  ?Precautions / Restrictions  Precautions ?Precautions: Fall ?Restrictions ?Weight Bearing Restrictions: No  ?  ? ?Mobility ? Bed Mobility ?  ?  ?  ?  ?  ?  ?  ?General bed mobility comments: anticipate total assist, not performed this session due to inattention and focus on session ?  ? ?Transfers ?  ?  ?  ?  ?  ?  ?  ?  ?  ?  ?  ? ?Ambulation/Gait ?  ?  ?  ?  ?  ?  ?  ?  ? ? ?Stairs ?  ?  ?  ?  ?  ? ? ?Wheelchair Mobility ?  ? ?Modified Rankin (Stroke Patients Only) ?  ? ? ?  ?Balance   ?  ?  ?  ?  ?  ?  ?  ?  ?  ?  ?  ?  ?  ?  ?  ?  ?  ?  ?  ? ?  ?Cognition Arousal/Alertness: Awake/alert ?Behavior During Therapy: Flat affect ?Overall Cognitive Status: No family/caregiver present to determine baseline cognitive functioning ?Area of Impairment: Rancho level ?  ?  ?  ?  ?  ?  ?  ?Rancho Levels of Cognitive Functioning ?Rancho Mirant Scales of Cognitive Functioning: Confused/inappropriate/non-agitated ?  ?  ?  ?  ?  ?  ?  ?General Comments: pt perseverates on wanting a bottle of vodka throughout session, unable to focus on therapy ?  ?Rancho Mirant Scales of Cognitive Functioning: Confused/inappropriate/non-agitated ? ?  ?Exercises Other Exercises ?Other Exercises: attempted to perform supine ther-ex, however pt has difficulty following commands only able to do 1-2 reps before becoming hyper focused on drinking alcohol. Ther-ex attempted included  SLRs, quad sets, hip abd/add, and heel slides with B LE. Total assist for L LE and min assist for R LE. ? ?  ?General Comments   ?  ?  ? ?Pertinent Vitals/Pain Pain Assessment ?Pain Assessment: No/denies pain  ? ? ?Home Living   ?  ?  ?  ?  ?  ?  ?  ?  ?  ?   ?  ?Prior Function    ?  ?  ?   ? ?PT Goals (current goals can now be found in the care plan section) Acute Rehab PT Goals ?Patient Stated Goal: to go home ?PT Goal Formulation: With patient ?Time For Goal Achievement: 05/19/21 ?Potential to Achieve Goals: Good ?Progress towards PT goals: Progressing toward goals ? ?  ?Frequency ? ? ? Min  2X/week ? ? ? ?  ?PT Plan Current plan remains appropriate  ? ? ?Co-evaluation   ?  ?  ?  ?  ? ?  ?AM-PAC PT "6 Clicks" Mobility   ?Outcome Measure ? Help needed turning from your back to your side while in a flat bed without using bedrails?: Total ?Help needed moving from lying on your back to sitting on the side of a flat bed without using bedrails?: Total ?Help needed moving to and from a bed to a chair (including a wheelchair)?: Total ?Help needed standing up from a chair using your arms (e.g., wheelchair or bedside chair)?: Total ?Help needed to walk in hospital room?: Total ?Help needed climbing 3-5 steps with a railing? : Total ?6 Click Score: 6 ? ?  ?End of Session   ?Activity Tolerance: Patient tolerated treatment well ?Patient left: in bed;with bed alarm set ?Nurse Communication: Mobility status ?PT Visit Diagnosis: Unsteadiness on feet (R26.81);Repeated falls (R29.6);Muscle weakness (generalized) (M62.81);History of falling (Z91.81);Difficulty in walking, not elsewhere classified (R26.2) ?  ? ? ?Time: 9381-8299 ?PT Time Calculation (min) (ACUTE ONLY): 12 min ? ?Charges:  $Therapeutic Exercise: 8-22 mins          ?          ? ?Elizabeth Palau, PT, DPT, GCS ?[phone] ? ? ? ?Samai Corea ?05/12/2021, 3:15 PM ? ?

## 2021-05-12 NOTE — Progress Notes (Signed)
Received call for rapid response. No family present upon arrival. Staff indicated to chaplain that spiritual care not needed.  ? ?Rev. Elizebeth Brooking, M.Div. ?Healthcare Chaplain ?

## 2021-05-13 ENCOUNTER — Inpatient Hospital Stay: Payer: Medicaid Other

## 2021-05-13 ENCOUNTER — Encounter: Payer: Self-pay | Admitting: Internal Medicine

## 2021-05-13 DIAGNOSIS — R4182 Altered mental status, unspecified: Secondary | ICD-10-CM

## 2021-05-13 DIAGNOSIS — A419 Sepsis, unspecified organism: Secondary | ICD-10-CM

## 2021-05-13 LAB — COMPREHENSIVE METABOLIC PANEL
ALT: 28 U/L (ref 0–44)
AST: 33 U/L (ref 15–41)
Albumin: 3.5 g/dL (ref 3.5–5.0)
Alkaline Phosphatase: 128 U/L — ABNORMAL HIGH (ref 38–126)
Anion gap: 13 (ref 5–15)
BUN: 24 mg/dL — ABNORMAL HIGH (ref 8–23)
CO2: 23 mmol/L (ref 22–32)
Calcium: 9.3 mg/dL (ref 8.9–10.3)
Chloride: 103 mmol/L (ref 98–111)
Creatinine, Ser: 1.56 mg/dL — ABNORMAL HIGH (ref 0.61–1.24)
GFR, Estimated: 50 mL/min — ABNORMAL LOW (ref >60)
Glucose, Bld: 164 mg/dL — ABNORMAL HIGH (ref 70–99)
Potassium: 5.8 mmol/L — ABNORMAL HIGH (ref 3.5–5.1)
Sodium: 139 mmol/L (ref 135–145)
Total Bilirubin: 0.8 mg/dL (ref 0.3–1.2)
Total Protein: 9.1 g/dL — ABNORMAL HIGH (ref 6.5–8.1)

## 2021-05-13 LAB — APTT: aPTT: 30 seconds (ref 24–36)

## 2021-05-13 LAB — BLOOD GAS, ARTERIAL
Acid-base deficit: 2.1 mmol/L — ABNORMAL HIGH (ref 0.0–2.0)
Acid-base deficit: 1.6 mmol/L (ref 0.0–2.0)
Bicarbonate: 23.2 mmol/L (ref 20.0–28.0)
Bicarbonate: 21.7 mmol/L (ref 20.0–28.0)
FIO2: 100 %
FIO2: 60 %
MECHVT: 450 mL
Mechanical Rate: 18
O2 Saturation: 99.8 %
O2 Saturation: 99.4 %
PEEP: 5 cmH2O
PEEP: 8 cmH2O
Patient temperature: 37
Patient temperature: 37
Pressure control: 10 cmH2O
RATE: 18 resp/min
pCO2 arterial: 41 mmHg (ref 32–48)
pCO2 arterial: 32 mmHg (ref 32–48)
pH, Arterial: 7.36 (ref 7.35–7.45)
pH, Arterial: 7.44 (ref 7.35–7.45)
pO2, Arterial: 162 mmHg — ABNORMAL HIGH (ref 83–108)
pO2, Arterial: 109 mmHg — ABNORMAL HIGH (ref 83–108)

## 2021-05-13 LAB — MAGNESIUM
Magnesium: 2.3 mg/dL (ref 1.7–2.4)
Magnesium: 1.9 mg/dL (ref 1.7–2.4)

## 2021-05-13 LAB — GLUCOSE, CAPILLARY
Glucose-Capillary: 142 mg/dL — ABNORMAL HIGH (ref 70–99)
Glucose-Capillary: 144 mg/dL — ABNORMAL HIGH (ref 70–99)
Glucose-Capillary: 156 mg/dL — ABNORMAL HIGH (ref 70–99)
Glucose-Capillary: 157 mg/dL — ABNORMAL HIGH (ref 70–99)
Glucose-Capillary: 175 mg/dL — ABNORMAL HIGH (ref 70–99)
Glucose-Capillary: 180 mg/dL — ABNORMAL HIGH (ref 70–99)
Glucose-Capillary: 203 mg/dL — ABNORMAL HIGH (ref 70–99)
Glucose-Capillary: 206 mg/dL — ABNORMAL HIGH (ref 70–99)
Glucose-Capillary: 271 mg/dL — ABNORMAL HIGH (ref 70–99)

## 2021-05-13 LAB — MRSA NEXT GEN BY PCR, NASAL: MRSA by PCR Next Gen: NOT DETECTED

## 2021-05-13 LAB — LACTIC ACID, PLASMA
Lactic Acid, Venous: 3.7 mmol/L (ref 0.5–1.9)
Lactic Acid, Venous: 4.8 mmol/L (ref 0.5–1.9)
Lactic Acid, Venous: 4.1 mmol/L (ref 0.5–1.9)

## 2021-05-13 LAB — CBC
HCT: 37.5 % — ABNORMAL LOW (ref 39.0–52.0)
Hemoglobin: 12.1 g/dL — ABNORMAL LOW (ref 13.0–17.0)
MCH: 33.2 pg (ref 26.0–34.0)
MCHC: 32.3 g/dL (ref 30.0–36.0)
MCV: 102.7 fL — ABNORMAL HIGH (ref 80.0–100.0)
Platelets: 326 10*3/uL (ref 150–400)
RBC: 3.65 MIL/uL — ABNORMAL LOW (ref 4.22–5.81)
RDW: 14.3 % (ref 11.5–15.5)
WBC: 6.1 10*3/uL (ref 4.0–10.5)
nRBC: 0 % (ref 0.0–0.2)

## 2021-05-13 LAB — COOXEMETRY PANEL
Carboxyhemoglobin: 1.2 % (ref 0.5–1.5)
Methemoglobin: <0.7 % (ref 0.0–1.5)
O2 Saturation: 58.5 %
Total hemoglobin: 9.7 g/dL — ABNORMAL LOW (ref 12.0–16.0)
Total oxygen content: 57.5 mL/dL

## 2021-05-13 LAB — AMMONIA: Ammonia: 14 umol/L (ref 9–35)

## 2021-05-13 LAB — PROCALCITONIN: Procalcitonin: 0.49 ng/mL

## 2021-05-13 LAB — TROPONIN I (HIGH SENSITIVITY)
Troponin I (High Sensitivity): 12 ng/L (ref <18)
Troponin I (High Sensitivity): 13 ng/L (ref <18)

## 2021-05-13 LAB — HEPARIN LEVEL (UNFRACTIONATED): Heparin Unfractionated: 0.35 IU/mL (ref 0.30–0.70)

## 2021-05-13 LAB — BRAIN NATRIURETIC PEPTIDE: B Natriuretic Peptide: 36.6 pg/mL (ref 0.0–100.0)

## 2021-05-13 LAB — POTASSIUM: Potassium: 4.4 mmol/L (ref 3.5–5.1)

## 2021-05-13 LAB — PHOSPHORUS
Phosphorus: 4.8 mg/dL — ABNORMAL HIGH (ref 2.5–4.6)
Phosphorus: 4.6 mg/dL (ref 2.5–4.6)

## 2021-05-13 LAB — CK: Total CK: 157 U/L (ref 49–397)

## 2021-05-13 LAB — PROTIME-INR
INR: 1.2 (ref 0.8–1.2)
Prothrombin Time: 14.8 seconds (ref 11.4–15.2)

## 2021-05-13 MED ORDER — ETOMIDATE 2 MG/ML IV SOLN
INTRAVENOUS | Status: AC
  Administered 2021-05-13: 10 mg via INTRAVENOUS
  Filled 2021-05-13: qty 20

## 2021-05-13 MED ORDER — NOREPINEPHRINE 4 MG/250ML-% IV SOLN
0.0000 ug/min | INTRAVENOUS | Status: DC
  Administered 2021-05-13: 2 ug/min via INTRAVENOUS
  Filled 2021-05-13: qty 250

## 2021-05-13 MED ORDER — SODIUM CHLORIDE 0.9 % IV SOLN: 2.0000 g | Freq: Two times a day (BID) | INTRAVENOUS | Status: DC

## 2021-05-13 MED ORDER — ETOMIDATE 2 MG/ML IV SOLN: 10.0000 mg | Freq: Once | INTRAVENOUS | Status: AC

## 2021-05-13 MED ORDER — PIPERACILLIN-TAZOBACTAM 3.375 G IVPB
3.3750 g | Freq: Three times a day (TID) | INTRAVENOUS | Status: DC
  Administered 2021-05-13 – 2021-05-16 (×9): 3.375 g via INTRAVENOUS
  Filled 2021-05-13 (×9): qty 50

## 2021-05-13 MED ORDER — FOLIC ACID 1 MG PO TABS
1.0000 mg | ORAL_TABLET | Freq: Every day | ORAL | Status: DC
  Administered 2021-05-14: 1 mg
  Filled 2021-05-13: qty 1

## 2021-05-13 MED ORDER — VANCOMYCIN HCL IN DEXTROSE 1-5 GM/200ML-% IV SOLN
1000.0000 mg | INTRAVENOUS | Status: DC
  Administered 2021-05-13: 1000 mg via INTRAVENOUS
  Filled 2021-05-13: qty 200

## 2021-05-13 MED ORDER — PROSOURCE TF PO LIQD
45.0000 mL | Freq: Two times a day (BID) | ORAL | Status: DC
  Administered 2021-05-13: 45 mL
  Filled 2021-05-13: qty 45

## 2021-05-13 MED ORDER — PROSOURCE TF PO LIQD
45.0000 mL | Freq: Every day | ORAL | Status: DC
Start: 2021-05-14 — End: 2021-05-15
  Administered 2021-05-14: 45 mL
  Filled 2021-05-13 (×2): qty 45

## 2021-05-13 MED ORDER — MIDAZOLAM HCL 2 MG/2ML IJ SOLN: 2.0000 mg | INTRAMUSCULAR | Status: DC | PRN

## 2021-05-13 MED ORDER — ROCURONIUM BROMIDE 10 MG/ML (PF) SYRINGE
PREFILLED_SYRINGE | INTRAVENOUS | Status: AC
  Administered 2021-05-13: 70 mg via INTRAVENOUS
  Filled 2021-05-13: qty 10

## 2021-05-13 MED ORDER — LACTATED RINGERS IV BOLUS (SEPSIS)
1000.0000 mL | Freq: Once | INTRAVENOUS | Status: AC
  Administered 2021-05-13: 1000 mL via INTRAVENOUS

## 2021-05-13 MED ORDER — FENTANYL CITRATE PF 50 MCG/ML IJ SOSY
50.0000 ug | PREFILLED_SYRINGE | INTRAMUSCULAR | Status: DC | PRN
  Administered 2021-05-13: 50 ug via INTRAVENOUS
  Filled 2021-05-13: qty 1

## 2021-05-13 MED ORDER — PANTOPRAZOLE 2 MG/ML SUSPENSION
40.0000 mg | Freq: Every day | ORAL | Status: DC
  Administered 2021-05-13 – 2021-05-14 (×2): 40 mg
  Filled 2021-05-13 (×2): qty 20

## 2021-05-13 MED ORDER — LACTATED RINGERS IV BOLUS
500.0000 mL | Freq: Once | INTRAVENOUS | Status: AC
  Administered 2021-05-13: 500 mL via INTRAVENOUS

## 2021-05-13 MED ORDER — PHENYLEPHRINE CONCENTRATED 100MG/250ML (0.4 MG/ML) INFUSION SIMPLE
0.0000 ug/min | INTRAVENOUS | Status: DC
  Filled 2021-05-13: qty 250

## 2021-05-13 MED ORDER — HYDROCORTISONE SOD SUC (PF) 100 MG IJ SOLR
100.0000 mg | Freq: Two times a day (BID) | INTRAMUSCULAR | Status: DC
  Administered 2021-05-13 – 2021-05-14 (×4): 100 mg via INTRAVENOUS
  Filled 2021-05-13 (×4): qty 2

## 2021-05-13 MED ORDER — FENTANYL CITRATE (PF) 100 MCG/2ML IJ SOLN
INTRAMUSCULAR | Status: AC
  Administered 2021-05-13: 50 ug
  Filled 2021-05-13: qty 2

## 2021-05-13 MED ORDER — VASOPRESSIN 20 UNITS/100 ML INFUSION FOR SHOCK
0.0000 [IU]/min | INTRAVENOUS | Status: DC
  Administered 2021-05-13 (×2): 0.03 [IU]/min via INTRAVENOUS
  Administered 2021-05-14: 0.02 [IU]/min via INTRAVENOUS
  Filled 2021-05-13 (×3): qty 100

## 2021-05-13 MED ORDER — FREE WATER
30.0000 mL | Status: DC
  Administered 2021-05-13 – 2021-05-14 (×7): 30 mL

## 2021-05-13 MED ORDER — INSULIN ASPART 100 UNIT/ML IJ SOLN
0.0000 [IU] | INTRAMUSCULAR | Status: DC
  Administered 2021-05-13: 3 [IU] via SUBCUTANEOUS
  Administered 2021-05-13: 5 [IU] via SUBCUTANEOUS
  Administered 2021-05-13: 3 [IU] via SUBCUTANEOUS
  Administered 2021-05-14: 2 [IU] via SUBCUTANEOUS
  Administered 2021-05-14: 3 [IU] via SUBCUTANEOUS
  Administered 2021-05-14: 2 [IU] via SUBCUTANEOUS
  Administered 2021-05-14: 5 [IU] via SUBCUTANEOUS
  Administered 2021-05-14: 3 [IU] via SUBCUTANEOUS
  Administered 2021-05-17 – 2021-05-19 (×10): 2 [IU] via SUBCUTANEOUS
  Administered 2021-05-19: 3 [IU] via SUBCUTANEOUS
  Administered 2021-05-19 – 2021-05-20 (×3): 2 [IU] via SUBCUTANEOUS
  Administered 2021-05-20: 3 [IU] via SUBCUTANEOUS
  Filled 2021-05-13 (×23): qty 1

## 2021-05-13 MED ORDER — VANCOMYCIN HCL 1500 MG/300ML IV SOLN
1500.0000 mg | Freq: Once | INTRAVENOUS | Status: DC
  Filled 2021-05-13: qty 300

## 2021-05-13 MED ORDER — DOCUSATE SODIUM 50 MG/5ML PO LIQD
100.0000 mg | Freq: Two times a day (BID) | ORAL | Status: DC
  Administered 2021-05-13 – 2021-05-14 (×3): 100 mg
  Filled 2021-05-13 (×3): qty 10

## 2021-05-13 MED ORDER — VITAL HIGH PROTEIN PO LIQD
1000.0000 mL | ORAL | Status: DC
  Administered 2021-05-13: 1000 mL

## 2021-05-13 MED ORDER — FENTANYL CITRATE PF 50 MCG/ML IJ SOSY: 50.0000 ug | PREFILLED_SYRINGE | INTRAMUSCULAR | Status: DC | PRN

## 2021-05-13 MED ORDER — POLYETHYLENE GLYCOL 3350 17 G PO PACK
17.0000 g | PACK | Freq: Every day | ORAL | Status: DC
  Administered 2021-05-13 – 2021-05-14 (×2): 17 g
  Filled 2021-05-13 (×2): qty 1

## 2021-05-13 MED ORDER — ORAL CARE MOUTH RINSE
15.0000 mL | OROMUCOSAL | Status: DC
  Administered 2021-05-13 – 2021-05-14 (×14): 15 mL via OROMUCOSAL

## 2021-05-13 MED ORDER — SODIUM CHLORIDE 0.9 % IV BOLUS
500.0000 mL | Freq: Once | INTRAVENOUS | Status: AC
Start: 2021-05-13 — End: 2021-05-13
  Administered 2021-05-13: 500 mL via INTRAVENOUS

## 2021-05-13 MED ORDER — INSULIN ASPART 100 UNIT/ML IV SOLN
5.0000 [IU] | Freq: Once | INTRAVENOUS | Status: AC
  Administered 2021-05-13: 5 [IU] via INTRAVENOUS
  Filled 2021-05-13: qty 0.05

## 2021-05-13 MED ORDER — FENTANYL CITRATE PF 50 MCG/ML IJ SOSY: 50.0000 ug | PREFILLED_SYRINGE | Freq: Once | INTRAMUSCULAR | Status: DC

## 2021-05-13 MED ORDER — MIDAZOLAM HCL 2 MG/2ML IJ SOLN
INTRAMUSCULAR | Status: AC
Start: 2021-05-13 — End: 2021-05-13
  Administered 2021-05-13: 1 mg via INTRAVENOUS
  Filled 2021-05-13: qty 2

## 2021-05-13 MED ORDER — SODIUM CHLORIDE 0.9 % IV SOLN
2.0000 g | Freq: Two times a day (BID) | INTRAVENOUS | Status: DC
  Filled 2021-05-13: qty 12.5

## 2021-05-13 MED ORDER — HEPARIN (PORCINE) 25000 UT/250ML-% IV SOLN
850.0000 [IU]/h | INTRAVENOUS | Status: DC
  Administered 2021-05-13: 850 [IU]/h via INTRAVENOUS
  Filled 2021-05-13: qty 250

## 2021-05-13 MED ORDER — CHLORHEXIDINE GLUCONATE 0.12% ORAL RINSE (MEDLINE KIT)
15.0000 mL | Freq: Two times a day (BID) | OROMUCOSAL | Status: DC
  Administered 2021-05-13 – 2021-05-14 (×3): 15 mL via OROMUCOSAL

## 2021-05-13 MED ORDER — DEXMEDETOMIDINE HCL IN NACL 400 MCG/100ML IV SOLN
0.0000 ug/kg/h | INTRAVENOUS | Status: DC
  Administered 2021-05-13: 0.4 ug/kg/h via INTRAVENOUS
  Filled 2021-05-13: qty 100

## 2021-05-13 MED ORDER — VANCOMYCIN HCL IN DEXTROSE 1-5 GM/200ML-% IV SOLN: 1000.0000 mg | Freq: Once | INTRAVENOUS | Status: DC

## 2021-05-13 MED ORDER — CHLORHEXIDINE GLUCONATE CLOTH 2 % EX PADS
6.0000 | MEDICATED_PAD | Freq: Every day | CUTANEOUS | Status: DC
  Administered 2021-05-13 – 2021-05-22 (×7): 6 via TOPICAL

## 2021-05-13 MED ORDER — HEPARIN SODIUM (PORCINE) 5000 UNIT/ML IJ SOLN
5000.0000 [IU] | Freq: Three times a day (TID) | INTRAMUSCULAR | Status: DC
  Administered 2021-05-13: 5000 [IU] via SUBCUTANEOUS
  Filled 2021-05-13: qty 1

## 2021-05-13 MED ORDER — DEXTROSE 50 % IV SOLN
1.0000 | Freq: Once | INTRAVENOUS | Status: AC
  Administered 2021-05-13: 50 mL via INTRAVENOUS
  Filled 2021-05-13: qty 50

## 2021-05-13 MED ORDER — VITAL 1.5 CAL PO LIQD
1000.0000 mL | ORAL | Status: DC
  Administered 2021-05-13: 1000 mL

## 2021-05-13 MED ORDER — ENSURE ENLIVE PO LIQD
237.0000 mL | Freq: Three times a day (TID) | ORAL | Status: DC
  Administered 2021-05-13: 237 mL

## 2021-05-13 MED ORDER — NOREPINEPHRINE 16 MG/250ML-% IV SOLN
0.0000 ug/min | INTRAVENOUS | Status: DC
  Administered 2021-05-13: 22 ug/min via INTRAVENOUS
  Administered 2021-05-14: 6 ug/min via INTRAVENOUS
  Filled 2021-05-13 (×2): qty 250

## 2021-05-13 MED ORDER — HEPARIN BOLUS VIA INFUSION
4000.0000 [IU] | Freq: Once | INTRAVENOUS | Status: DC
Start: 2021-05-13 — End: 2021-05-13
  Filled 2021-05-13: qty 4000

## 2021-05-13 MED ORDER — HEPARIN (PORCINE) 25000 UT/250ML-% IV SOLN
1400.0000 [IU]/h | INTRAVENOUS | Status: DC
  Administered 2021-05-13 – 2021-05-14 (×2): 1000 [IU]/h via INTRAVENOUS
  Administered 2021-05-15: 1200 [IU]/h via INTRAVENOUS
  Administered 2021-05-16 – 2021-05-17 (×2): 1400 [IU]/h via INTRAVENOUS
  Filled 2021-05-13 (×5): qty 250

## 2021-05-13 MED ORDER — IOHEXOL 350 MG/ML SOLN
75.0000 mL | Freq: Once | INTRAVENOUS | Status: AC | PRN
  Administered 2021-05-13: 75 mL via INTRAVENOUS

## 2021-05-13 MED ORDER — SODIUM CHLORIDE 0.9 % IV SOLN
2.0000 g | Freq: Once | INTRAVENOUS | Status: AC
  Administered 2021-05-13: 2 g via INTRAVENOUS
  Filled 2021-05-13: qty 12.5

## 2021-05-13 MED ORDER — SODIUM ZIRCONIUM CYCLOSILICATE 5 G PO PACK
10.0000 g | PACK | Freq: Once | ORAL | Status: AC
  Administered 2021-05-13: 10 g via ORAL
  Filled 2021-05-13: qty 2

## 2021-05-13 MED ORDER — ACETAMINOPHEN 325 MG PO TABS: 650.0000 mg | ORAL_TABLET | ORAL | Status: DC | PRN

## 2021-05-13 MED ORDER — NOREPINEPHRINE 4 MG/250ML-% IV SOLN
INTRAVENOUS | Status: AC
Start: 2021-05-13 — End: 2021-05-13
  Administered 2021-05-13: 4 ug/kg/min
  Filled 2021-05-13: qty 250

## 2021-05-13 MED ORDER — ADULT MULTIVITAMIN W/MINERALS CH
1.0000 | ORAL_TABLET | Freq: Every day | ORAL | Status: DC
  Administered 2021-05-14: 1
  Filled 2021-05-13: qty 1

## 2021-05-13 MED ORDER — MIDAZOLAM HCL 2 MG/2ML IJ SOLN: 1.0000 mg | Freq: Once | INTRAMUSCULAR | Status: AC

## 2021-05-13 MED ORDER — ROCURONIUM BROMIDE 10 MG/ML (PF) SYRINGE: 100.0000 mg | PREFILLED_SYRINGE | Freq: Once | INTRAVENOUS | Status: AC

## 2021-05-13 NOTE — Sepsis Progress Note (Signed)
Monitoring for the code sepsis protocol. °

## 2021-05-13 NOTE — Progress Notes (Addendum)
PHARMACY CONSULT NOTE - FOLLOW UP ? ?Pharmacy Consult for Electrolyte Monitoring and Replacement  ? ?Recent Labs: ?Potassium (mmol/L)  ?Date Value  ?05/13/2021 5.8 (H)  ? ?Magnesium (mg/dL)  ?Date Value  ?05/13/2021 2.3  ? ?Calcium (mg/dL)  ?Date Value  ?05/13/2021 9.3  ? ?Albumin (g/dL)  ?Date Value  ?05/13/2021 3.5  ?03/03/2021 3.0 (L)  ? ?Phosphorus (mg/dL)  ?Date Value  ?05/13/2021 4.8 (H)  ? ?Sodium (mmol/L)  ?Date Value  ?05/13/2021 139  ?03/03/2021 145 (H)  ? ? ?Assessment: ?65YOM initially presented after fall at home with traumatic SAH and SDH in the setting of alcohol intoxication. PMH paroxysmal Afib, HTN, dCHF. Pharmacy has been consulted to dose electrolytes. ? ?Scr increased 1.26 > 1.56 ? ?Goal of Therapy:  ?Lytes WNL ? ?Plan:  ?K 5.8. Giving Lokelma 10 g x 1. ?F/u K+ @1100 . ? ? , PharmD ?Pharmacy Resident  ?05/13/2021 ?7:56 AM ? ? ?

## 2021-05-13 NOTE — Procedures (Signed)
Intubation Procedure Note ? ?Charles Schmitt.  ?[id number]  ?[birth year] ? ?Date:05/13/21  ?Time:2:08 AM  ? ?Provider Performing:Brent Eder Macek  ? ? ?Procedure: Intubation (31500) ? ?Indication(s) ?Respiratory Failure ? ?Consent ?Unable to obtain consent due to emergent nature of procedure. ? ? ?Anesthesia ?Etomidate, Versed, Fentanyl, and Rocuronium ? ? ?Time Out ?Verified patient identification, verified procedure, site/side was marked, verified correct patient position, special equipment/implants available, medications/allergies/relevant history reviewed, required imaging and test results available. ? ? ?Sterile Technique ?Usual hand hygeine, masks, and gloves were used ? ? ?Procedure Description ?Patient positioned in bed supine.  Sedation given as noted above.  Patient was intubated with endotracheal tube using Glidescope.  View was Grade 1 full glottis .  Number of attempts was 1.  Colorimetric CO2 detector was consistent with tracheal placement. ? ? ?Complications/Tolerance ?None; patient tolerated the procedure well. ?Chest X-ray is ordered to verify placement. ? ? ?EBL ?Minimal ? ? ?Specimen(s) ?None ? ?Heber [place], MD ?[facility] ?Pager: [phone] ?Cell: [phone] ?After 7:00 pm call Elink  [phone] ? ? ?

## 2021-05-13 NOTE — Procedures (Signed)
Central Venous Catheter Insertion Procedure Note ? ?Charles Schmitt.  ?[id number]  ?09/24/1955 ? ?Date:05/13/21  ?Time:2:08 AM  ? ?Provider Performing:Brent Delaine Canter  ? ?Procedure: Insertion of Non-tunneled Central Venous Catheter(36556) with US guidance (29937)  ? ?Indication(s) ?Medication administration ? ?Consent ?Unable to obtain consent due to emergent nature of procedure. ? ?Anesthesia ?Topical only with 1% lidocaine  ? ?Timeout ?Verified patient identification, verified procedure, site/side was marked, verified correct patient position, special equipment/implants available, medications/allergies/relevant history reviewed, required imaging and test results available. ? ?Sterile Technique ?Maximal sterile technique including full sterile barrier drape, hand hygiene, sterile gown, sterile gloves, mask, hair covering, sterile ultrasound probe cover (if used). ? ?Procedure Description ?Area of catheter insertion was cleaned with chlorhexidine and draped in sterile fashion.  With real-time ultrasound guidance a central venous catheter was placed into the left internal jugular vein. Nonpulsatile blood flow and easy flushing noted in all ports.  The catheter was sutured in place and sterile dressing applied. ? ?Complications/Tolerance ?None; patient tolerated the procedure well. ?Chest X-ray is ordered to verify placement for internal jugular or subclavian cannulation.   Chest x-ray is not ordered for femoral cannulation. ? ?EBL ?Minimal ? ?Specimen(s) ?None ? ?Charles Calzada, MD ?[place] ?Pager: [phone] ?Cell: [phone] ?After 7:00 pm call Elink  [phone] ? ?

## 2021-05-13 NOTE — Progress Notes (Addendum)
ANTICOAGULATION CONSULT NOTE  ? ?Pharmacy Consult for heparin infusion ?Indication: VTE  ? ?No Known Allergies ? ?Patient Measurements: ?Height: 6\' 5"  (195.6 cm) ?Weight: 59.3 kg (130 lb 11.7 oz) ?IBW/kg (Calculated) : 89.1 ?Heparin Dosing Weight: 59.3 kg ? ?Vital Signs: ?Temp: 96.8 ?F (36 ?C) (04/04 0000) ?Temp Source: Axillary (04/03 2328) ?BP: 91/74 (04/03 2328) ?Pulse Rate: 135 (04/03 2328) ? ?Labs: ?Recent Labs  ?  05/11/21 ?0454 05/12/21 ?1634 05/12/21 ?1634 05/12/21 ?2227 05/12/21 ?2243 05/13/21 ?0106  ?HGB  --  10.8*   < >  --  12.2* 12.1*  ?HCT  --  33.0*  --   --  37.6* 37.5*  ?PLT  --  312  --   --  310 326  ?CREATININE 0.78 0.96  --  1.26*  --   --   ? < > = values in this interval not displayed.  ? ? ?Estimated Creatinine Clearance: 50.3 mL/min (A) (by C-G formula based on SCr of 1.26 mg/dL (H)). ? ? ?Medical History: ?Past Medical History:  ?Diagnosis Date  ? Alcohol abuse   ? a. 1/5 of vodka daily.  ? Cardiomyopathy (HCC)   ? a. 01/2021 Echo: EF 30-35%, glob HK, apical AK. Mildly reduced RV fxn.  ? HFrEF (heart failure with reduced ejection fraction) (HCC)   ? Hyperlipidemia   ? Hypertension   ? PAF (paroxysmal atrial fibrillation) (HCC)   ? Tobacco abuse   ? ? ?Assessment: ?Pt is 66 yo male admitted on 3/25, now being started on heparin and "moved to the ICU for change in mental status and sepsis, left leg is swollen, mottled." ? ?Goal of Therapy:  ?Heparin level 0.3-0.7 units/ml ?Monitor platelets by anticoagulation protocol: Yes ?  ?Plan:  ?No boluses at this time, per MD ?Start heparin infusion at 850 units/hr ?BL INR @ admission, check aPTT w/ 1st HL ?Check HL in 6 hrs after start of infusion ?CBC daily while on heparin ? ?4/25, PharmD, MBA ?05/13/2021 ?2:25 AM ? ? ? ?

## 2021-05-13 NOTE — Progress Notes (Signed)
PHARMACIST - PHYSICIAN COMMUNICATION ?  ?CONCERNING: Hydrocortisone IV  ?  ?Current order: Hydrocortisone IV 50mg  q6h ?  ?  ?DESCRIPTION: ?Per Rudolph Protocol:  ? ?IV hydrocortisone will be converted to either a q8h or q12h frequency with the same total daily dose (TDD). ? ?Ordered Dose: 1 to 200 mg TDD; convert to: TDD div q12h.  ?Ordered Dose: 201 to 300 mg TDD; convert to: TDD div q8h.  ?Ordered Dose: >300 mg TDD; DAW. ? ?Order has been adjusted to: Hydrocortisone IV 100mg  q12h ? ? ? , PharmD, MS PGPM ?Clinical Pharmacist ?05/13/2021 ?11:54 AM ? ? ? ?

## 2021-05-13 NOTE — Procedures (Signed)
Patient Name: Charles Schmitt.  ?MRN: [id number]  ?Epilepsy Attending: Lora Havens  ?Referring Physician/Provider: Maryjane Hurter, MD ?Date: 05/13/2021 ?Duration: 22.48 mins ? ?Patient history: 66yo m with ams. EEG to evaluate for seizure ? ?Level of alertness:  comatose ? ?AEDs during EEG study: None ? ?Technical aspects: This EEG study was done with scalp electrodes positioned according to the 10-20 International system of electrode placement. Electrical activity was acquired at a sampling rate of 500Hz  and reviewed with a high frequency filter of 70Hz  and a low frequency filter of 1Hz . EEG data were recorded continuously and digitally stored.  ? ?Description: EEG showed continuous generalized and lateralized right hemisphere 2-3hz  delta slowing admixed with 15-16hz  generalized beta activity. Hyperventilation and photic stimulation were not performed.    ? ?ABNORMALITY ?- Continuous slow, generalized and lateralized right hemisphere ? ?IMPRESSION: ?This study is is suggestive of cortical dysfunction arising from right hemisphere likely secondary to underlying subdural hematoma. Additionally there is moderate to severe diffuse encephalopathy, nonspecific etiology. No seizures or epileptiform discharges were seen throughout the recording. ? ?Lora Havens  ? ?

## 2021-05-13 NOTE — Sepsis Progress Note (Addendum)
Notified bedside nurse of need to draw lactic acid and blood cultures.  Per RN, phlebotomist unsuccessful with collecting blood cultures.  Antibiotics started prior to blood cultures being collected. ? ?

## 2021-05-13 NOTE — Progress Notes (Signed)
Pharmacy Antibiotic Note ? ?Charles Schmitt. is a 66 y.o. male admitted on 05/03/2021 with cellulitis/infection of unknown source.  Pharmacy has been consulted for Cefepime and Vancomycin dosing x 7 days. ? ?Plan: ?Cefepime 2 g q12h x 7 days per indication and SCr. ? ?Vancomycin 1000 mg IV Q 24 hr x 7 days ?Goal AUC 400-550. ?Expected AUC: 507.2 ?SCr used: 1.26 ?TBW 59.3 kg << IBW 89.1 kg ? ?Pharmacy will continue to follow and will adjust abx dosing when warranted. ? ? ?Height: 6\' 5"  (195.6 cm) ?Weight: 59.3 kg (130 lb 11.7 oz) ?IBW/kg (Calculated) : 89.1 ? ?Temp (24hrs), Avg:98.7 ?F (37.1 ?C), Min:96.8 ?F (36 ?C), Max:100.4 ?F (38 ?C) ? ?Recent Labs  ?Lab 05/07/21 ?1412 05/08/21 ?0630 05/09/21 ?05/11/21 05/10/21 ?07/10/21 05/11/21 ?0454 05/12/21 ?1634 05/12/21 ?2227 05/12/21 ?2243  ?WBC 3.2* 3.8*  --   --   --  4.1  --  5.5  ?CREATININE  --  0.73 0.68 0.77 0.78 0.96 1.26*  --   ?LATICACIDVEN  --   --   --   --   --  1.4 3.6*  --   ?  ?Estimated Creatinine Clearance: 50.3 mL/min (A) (by C-G formula based on SCr of 1.26 mg/dL (H)).   ? ?No Known Allergies ? ?Antimicrobials this admission: ?4/04 Flagyl >> x 1 dose ?4/04 Cefepime >> x 7 days ?4/04 Vancomycin >> x 7 days ? ?Microbiology results: ?4/04 BCx: Pending ? ?Thank you for allowing pharmacy to be a part of this patient?Schmitt care. ? ?Calia Napp,Charles Schmitt ?05/13/2021 1:11 AM ? ?

## 2021-05-13 NOTE — Progress Notes (Signed)
PT Cancellation Note ? ?Patient Details ?Name: Charles Schmitt. ?MRN: [id number] ?DOB: 01/22/1956 ? ? ?Cancelled Treatment:    Reason Eval/Treat Not Completed: Patient not medically ready. Change in status with pt moved to CCU and intubated. Of note, B PE. Due to change in status, will sign off at this time. Please place new PT orders when he is able to perform PT. ? ? ?Lindsi Bayliss ?05/13/2021, 8:43 AM ?Elizabeth Palau, PT, DPT, GCS ?[phone] ? ?

## 2021-05-13 NOTE — Progress Notes (Addendum)
eLink Physician-Brief Progress Note ?Patient Name: Charles Schmitt. ?DOB: 1955/10/22 ?MRN: [id number] ? ? ?Date of Service ? 05/13/2021  ?HPI/Events of Note ? Chest CTA reveals remote bilateral pulmonary emboli with webs. No acute appearing emboli.  ?eICU Interventions ? Plan: ?D/C Heparin IV infusion. ?D/C Heparin per pharmacy consult.  ?Heparin 5000 units [place] Q 8 hours.  ?  ? ? ? ?Intervention Category ?Major Interventions: Other: ? ?Eviana Sibilia Dennard Nip ?05/13/2021, 6:05 AM ?

## 2021-05-13 NOTE — Progress Notes (Signed)
? ?NAME:  Charles Schmitt., MRN:  [id number], DOB:  [birth year], LOS: 10 ?ADMISSION DATE:  05/03/2021, CONSULTATION DATE:  4/4 ?REFERRING MD:  Myriam Forehand, CHIEF COMPLAINT:  Change in mental status  ? ?History of Present Illness:  ?This is a 66 y/o male who was admitted on 3/25 after a fall at home with a traumatic SAH and SDH in the setting of alcohol intoxicatino. NSGY was consulted and recommended conservative management. Since admission he has been on the hospitalist service and PT, OT and SLP have been following. He has noted to be weak and unable to perform his ADL's.  He was treated for Alcohol withdrawal with thiamine, folate, vitamins and lithium.  Palliative medicine saw him to discuss goals fo care on 3/28 during which time he remained full code and was focused on being able to leave the hospital.  The patient has been waiting for SNF during his hospitalization.  Paroxysmal atrial fibrillation, hypertension and diastolic heart failure have been managed by the primary service. ?On 4/3 he had a change in mental status to becoming minimally responsive.  A head CT was repeated and showed no significant change in his small right subdural hematoma and 74mm right to left midline shift.   ? ?On 4/4 the patient became minimally responsive, tachycardic, tachypneic, and had soft blood pressure with associated respiratory alkalosis and lactic acidosis.  PCCM was consulted for further evaluation.   ? ?Pertinent  Medical History  ?Alcohol dependence ? ? ?Significant Hospital Events: ?Including procedures, antibiotic start and stop dates in addition to other pertinent events   ?3/25 admission for multiple falls, traumatic SAH and SDH ?3/28 palliative care consultation: full code ?4/4 moved to the ICU for change in mental status and sepsis, left leg is swollen, mottled.  ? ? ?Procedure: ?4/4 ETT > ?4/4 L IJ CVL >  ? ?Micro: ?4/4 blood >  ? ?Abx ?4/4 vanc >  ?4/4 cefepime >  ? ? ?Imaging: ?3/25 lumbar ct> no fracture or  subluxation, hepatic steatosis ?3/25 CT head > Acute frontoparietal R subdural hematoma with 3mm leftward midline shift, three small intraparenchymal hemorrhagic contusion in R frontal lobe with edema, no mass effect, R SAH in sylvian fissure unchanged ?3/28 CT head > no significant change ?4/3 CT head > grossy stable size and appearance of R sided subdural with 35mm R to left midline shift (unchanged compared to prior), SAH on right has nearly resolved, contusions improved ?4/4 CT head > no significant change compared to 4/3, midline shift 81mm ? ? ?Interim History / Subjective:  ?Had to start levo this morning. I agree that RV looks distended but surprisingly IVC has quite a bit of respiratory variation still (>>50%). Remains very unresponsive on low dose precedex. ? ?Objective   ?Blood pressure (!) 148/96, pulse (!) 132, temperature 100.2 ?F (37.9 ?C), resp. rate (!) 27, height 6\' 5"  (1.956 m), weight 59.3 kg, SpO2 94 %. ?CVP:  [0 mmHg] 0 mmHg  ?Vent Mode: PCV ?FiO2 (%):  [60 %-100 %] 60 % ?Set Rate:  [18 bmp] 18 bmp ?Vt Set:  [450 mL] 450 mL ?PEEP:  [5 cmH20] 5 cmH20 ?Plateau Pressure:  [14 cmH20-20 cmH20] 20 cmH20  ? ?Intake/Output Summary (Last 24 hours) at 05/13/2021 0826 ?Last data filed at 05/13/2021 0700 ?Gross per 24 hour  ?Intake 424.25 ml  ?Output 350 ml  ?Net 74.25 ml  ? ?Filed Weights  ? 05/10/21 0442 05/11/21 0447 05/12/21 0500  ?Weight: 60.2 kg 58.9 kg 59.3 kg  ? ? ?  Examination: ? ?General appearance: 66 y.o., male, intubated and chronically ill appearing, frail  ?Eyes: anicteric sclerae; PERRL, not tracking ?HENT: NCAT; MMM ?Neck: Trachea midline; no lymphadenopathy, no JVD ?Lungs: diminished on left, equal chest rise ?CV: tachy RR, no murmur  ?Abdomen: Soft, non-tender; non-distended, BS present  ?Extremities: +LLE edema, dark, almost dusky coloration ?Skin: Normal turgor and texture; no rash ?Neuro: +gag, triggers breaths ? ? ?CTA chest with LL predominant bronchiectasis, LLL consolidation, nodular  consolidation RLL ? ?Resolved Hospital Problem list   ? ? ?Assessment & Plan:  ?# Septic shock due to HAP/aspiration pneumonia ?Alternative potential source LLE which is swollen but it doesn't feel very warm.  ?- check coox ?- with degree of respiratory variation he has on IVC ultrasound let's do another 500 cc NS and assess response ?- wean Levophed for MAP > 65 ?- MRSA nare swab. Start zosyn, will dc cefepime to avoid confounder of his depressed LOC. Follow cultures and narrow as able ? ?# Acute respiratory failure with hypoxemia and inability to protect airway ?Full mechanical vent support ?VAP prevention, PAD protocol, keep sedation light ?Daily WUA/SBT ? ?# HFrEF ?- Formal echo in AM ? ?# Acute metabolic encephalopathy  ?# Alcohol use disorder ?Alternatively consider seizure, TBI ?- PAD protocol: RASS goal 0 to -1, fentanyl prn, precedex infusion, versed prn ?- Thiamine, folate to continue ?- EEG ? ?# AKI ?- f/u response to initial resuscitative measures ? ?# Traumatic subdural or subarachnoid hemorrhage > stable on multiple images ?- Supportive care ?- Heparin on hold until DVT US LLE  ? ?# Severe physical deconditioning at baseline ?# Moderate-severe protein calorie malnourishment ?- Will need PT consult after extubation, OT ? ?Agree that overall prognosis is poor, need ongoing goals of care conversations.  His likelihood of surviving this illness are low considering his alcohol abuse, baseline deconditioned state and acute worsening with multi-organ failure. ? ?Best Practice (right click and "Reselect all SmartList Selections" daily)  ? ?Diet/type: tubefeeds ?DVT prophylaxis: prophylactic heparin  ?GI prophylaxis: PPI ?Lines: Central line and yes and it is still needed ?Foley:  Yes, and it is still needed ?Code Status:  full code ?Last date of multidisciplinary goals of care discussion [3/28 palliative care encounter noted; we will attempted to contact his family on 4/4] ? ?Critical care time: 40 minutes ?   ? ?Judithann Graves  ?[facility] ? ? ? ? ? ?

## 2021-05-13 NOTE — Progress Notes (Signed)
Nutrition Follow-up ? ?DOCUMENTATION CODES:  ? ?Underweight, Severe malnutrition in context of chronic illness ? ?INTERVENTION:  ? ?Change to Vital 1.5@55ml /hr + ProSource TF 59ml daily via tube  ? ?Free water flushes 54ml q4 hours to maintain tube patency  ? ?Regimen provides 2020kcal/day, 100g/day protein and 1112ml/day of free water.  ? ?Pt at high refeed risk; recommend monitor potassium, magnesium and phosphorus labs daily until stable ? ?NUTRITION DIAGNOSIS:  ? ?Severe Malnutrition related to chronic illness (ETOH abuse) as evidenced by moderate fat depletion, severe fat depletion, moderate muscle depletion, severe muscle depletion. ?-ongoing  ? ?GOAL:  ? ?Provide needs based on ASPEN/SCCM guidelines ? ?MONITOR:  ? ?Vent status, Labs, Weight trends, TF tolerance, Skin, I & O's ? ?ASSESSMENT:  ? ?66 y/o male with h/o HTN, CHF, etoh abuse, cardiomyopathy, HLD and PAF who is admitted with SAH after fall. ? ?Pt transferred to the ICU and ventilated after suspected aspiration and septic shock. Pt remains sedated and ventilated. OGT in place. Tube feed protocol initiated and pt is tolerating well. Pt documented to be eating 0-100% of meals since admission. Pt previously drinking Ensure supplements. Per chart, pt appears fairly weight stable since admission.    ? ?Medications reviewed and include: colace, folic acid, solu-cortef, MVI, protonix, thiamine, levophed, vasopressin  ? ?Labs reviewed: K 5.8(H), BUN 24(H), creat 1.56(H), P 4.8(H), Mg 2.3 wnl ?Cbgs- 157, 156, 142, 271, 144 x 24 hrs ? ?Patient is currently intubated on ventilator support ?MV: 14.2 L/min ?Temp (24hrs), Avg:99.6 ?F (37.6 ?C), Min:96.8 ?F (36 ?C), Max:100.6 ?F (38.1 ?C) ? ?Propofol: none  ? ?MAP- >58mmHg  ? ?UOP-  ? ?Diet Order:   ?Diet Order   ? ?       ?  Diet NPO time specified  Diet effective now       ?  ? ?  ?  ? ?  ? ?EDUCATION NEEDS:  ? ?Education needs have been addressed ? ?Skin:  Skin Assessment: Skin Integrity Issues: ?Skin  Integrity Issues:: Other (Comment) ?Other: healed pressure injury to sacrum ? ?Last BM:  4/3- type 4 ? ?Height:  ? ?Ht Readings from Last 1 Encounters:  ?05/03/21 6\' 5"  (1.956 m)  ? ? ?Weight:  ? ?Wt Readings from Last 1 Encounters:  ?05/12/21 59.3 kg  ? ? ?Ideal Body Weight:  94.5 kg ? ?BMI:  Body mass index is 15.5 kg/m?. ? ?Estimated Nutritional Needs:  ? ?Kcal:  2040kcal/day ? ?Protein:  95-105g/day ? ?Fluid:  1.8-2.1L/day ? ?12-31-1979 MS, RD, LDN ?Please refer to [facility]ION for RD and/or RD on-call/weekend/after hours pager ? ?

## 2021-05-13 NOTE — Consult Note (Signed)
? ?NAME:  Charles PaceFrank Plaia Jr., MRN:  [id number], DOB:  Jul 03, 1955, LOS: 10 ?ADMISSION DATE:  05/03/2021, CONSULTATION DATE:  4/4 ?REFERRING MD:  Charles ForehandAyiku, CHIEF COMPLAINT:  Change in mental status  ? ?History of Present Illness:  ?This is a 66 y/o male who was admitted on 3/25 after a fall at home with a traumatic SAH and SDH in the setting of alcohol intoxicatino. NSGY was consulted and recommended conservative management. Since admission he has been on the hospitalist service and PT, OT and SLP have been following. He has noted to be weak and unable to perform his ADL's.  He was treated for Alcohol withdrawal with thiamine, folate, vitamins and lithium.  Palliative medicine saw him to discuss goals fo care on 3/28 during which time he remained full code and was focused on being able to leave the hospital.  The patient has been waiting for SNF during his hospitalization.  Paroxysmal atrial fibrillation, hypertension and diastolic heart failure have been managed by the primary service. ?On 4/3 he had a change in mental status to becoming minimally responsive.  A head CT was repeated and showed no significant change in his small right subdural hematoma and 5mm right to left midline shift.   ? ?On 4/4 the patient became minimally responsive, tachycardic, tachypneic, and had soft blood pressure with associated respiratory alkalosis and lactic acidosis.  PCCM was consulted for further evaluation.   ? ?Pertinent  Medical History  ?Alcohol dependence ? ? ?Significant Hospital Events: ?Including procedures, antibiotic start and stop dates in addition to other pertinent events   ?3/25 admission for multiple falls, traumatic SAH and SDH ?3/28 palliative care consultation: full code ?4/4 moved to the ICU for change in mental status and sepsis, left leg is swollen, mottled.  ? ? ?Procedure: ?4/4 ETT > ?4/4 L IJ CVL >  ? ?Micro: ?4/4 blood >  ? ?Abx ?4/4 vanc >  ?4/4 cefepime >  ? ? ?Imaging: ?3/25 lumbar ct> no fracture or  subluxation, hepatic steatosis ?3/25 CT head > Acute frontoparietal R subdural hematoma with 4mm leftward midline shift, three small intraparenchymal hemorrhagic contusion in R frontal lobe with edema, no mass effect, R SAH in sylvian fissure unchanged ?3/28 CT head > no significant change ?4/3 CT head > grossy stable size and appearance of R sided subdural with 6mm R to left midline shift (unchanged compared to prior), SAH on right has nearly resolved, contusions improved ?4/4 CT head > no significant change compared to 4/3, midline shift 5mm ? ? ?Interim History / Subjective:  ?As above ? ?Objective   ?Blood pressure 91/74, pulse (!) 135, temperature (!) 96.8 ?F (36 ?C), resp. rate (!) 38, height 6\' 5"  (1.956 m), weight 59.3 kg, SpO2 96 %. ?   ?Vent Mode: PRVC ?FiO2 (%):  [100 %] 100 % ?Set Rate:  [18 bmp] 18 bmp ?Vt Set:  [450 mL] 450 mL ?PEEP:  [5 cmH20] 5 cmH20 ?Plateau Pressure:  [15 cmH20] 15 cmH20  ? ?Intake/Output Summary (Last 24 hours) at 05/13/2021 0201 ?Last data filed at 05/12/2021 0500 ?Gross per 24 hour  ?Intake --  ?Output 250 ml  ?Net -250 ml  ? ?Filed Weights  ? 05/10/21 0442 05/11/21 0447 05/12/21 0500  ?Weight: 60.2 kg 58.9 kg 59.3 kg  ? ? ?Examination: ? ?General:  In bed, unresponsive to sternal rub ?HENT: NCAT OP clear ?PULM: CTA B, vent supported breathing ?CV: Distant heart sounds, no mgr, tachycardic, elevated JVD; palpable pulses in right foot, doppler  pulses through edema on left ?GI: BS infrequent, soft, nontender ?MSK: normal bulk and tone ?Derm: R leg warm, well perfused, left leg is mottled, grossly swollen, cyanotic compared to the right ?Neuro: moans incoherently, more with sternal rub, non-verbal, doesn't follow commands ? ?Resolved Hospital Problem list   ? ? ?Assessment & Plan:  ?Sinus tachycardia with associated lactic acidosis, soft blood pressure, lactic acidosis: most likely this is sepsis from left leg cellulitis but ddx includes DVT/PE ?Paroxysmal atrial fibrillation ?Admit to  ICU ?Tele monitoring ?Place CVL for vasopressors, monitor CVP ?Levophed for MAP > 65 ?Bolus LR per sepsis protocol: 30cc/kg ?Blood culture ?Start vanc, cefepime ?Given possibility of DVT, start heparin per pharmacy ?Volume resuscitate to mitigate risk of IV contrast then check CT angiogram chest ?Bedside echocardiogram assessment ?Check LE vascular ultrasound of left leg ?Serial exam of his leg ?May need CT angiogram leg if lower extremity doppler is negative ?Check troponin, repeat lactic acid ? ?Acute respiratory failure with hypoxemia and inability to protect airway ?Intubate now ?Full mechanical vent support ?VAP prevention ?Daily WUA/SBT ? ?HFrEF ?Tele ?Check 12 lead ?Formal echo in AM ? ?Acute metabolic encephalopathy due to sepsis vs PE ?Need for sedation for mechanical ventilation ?Alcohol abuse  ?PAD protocol: RASS goal 0 to -1, fentanyl prn, precedex infusion, versed prn ?Thiamine, folate to continue ? ?AKI ?Volume resuscitate ?Monitor BMET and UOP ?Replace electrolytes as needed ?Repeat BMET after 30cc/kg IV fluid bolus ? ?Traumatic subdural or subarachnoid hemorrhage > stable on multiple images ?Supportive care ?Heparin per pharmacy for DVT left leg> no bolus, infusion is OK ? ?Severe physical deconditioning at baseline ?Will need PT consult after extubation, OT ? ?Overall prognosis is poor, need ongoing goals of care conversations.  His likelihood of surviving this illness are low considering his alcohol abuse, baseline deconditioned state and acute worsening with multi-organ failure ? ?Best Practice (right click and "Reselect all SmartList Selections" daily)  ? ?Diet/type: tubefeeds ?DVT prophylaxis: systemic heparin ?GI prophylaxis: PPI ?Lines: Central line and yes and it is still needed ?Foley:  Yes, and it is still needed ?Code Status:  full code ?Last date of multidisciplinary goals of care discussion [3/28 palliative care encounter noted; we attempted to contact his family on 4/4, no  answer] ? ?Labs   ?CBC: ?Recent Labs  ?Lab 05/07/21 ?1412 05/08/21 ?0630 05/12/21 ?1634 05/12/21 ?2243 05/13/21 ?0106  ?WBC 3.2* 3.8* 4.1 5.5 6.1  ?NEUTROABS  --   --  1.8 3.5  --   ?HGB 9.4* 10.1* 10.8* 12.2* 12.1*  ?HCT 28.4* 30.2* 33.0* 37.6* 37.5*  ?MCV 100.4* 99.7 100.9* 102.2* 102.7*  ?PLT 142* 182 312 310 326  ? ? ?Basic Metabolic Panel: ?Recent Labs  ?Lab 05/07/21 ?0559 05/08/21 ?0630 05/09/21 ?2130 05/10/21 ?8657 05/11/21 ?0454 05/12/21 ?1634 05/12/21 ?2227  ?NA 138 140 140 138 139 138 137  ?K 3.5 3.8 3.3* 4.6 4.0 4.2 5.8*  ?CL 104 105 106 106 105 102 103  ?CO2 27 27 26 26 25 28  21*  ?GLUCOSE 109* 98 114* 102* 119* 128* 169*  ?BUN 10 8 9 11 15 16 21   ?CREATININE 0.83 0.73 0.68 0.77 0.78 0.96 1.26*  ?CALCIUM 8.4* 8.8* 8.5* 8.9 9.1 9.4 9.4  ?MG 2.0 2.0 1.7 2.4 2.4  --   --   ?PHOS 3.2 3.8 3.9 3.0 3.4  --   --   ? ?GFR: ?Estimated Creatinine Clearance: 50.3 mL/min (A) (by C-G formula based on SCr of 1.26 mg/dL (H)). ?Recent Labs  ?Lab  05/08/21 ?0630 05/12/21 ?1634 05/12/21 ?2227 05/12/21 ?2243 05/13/21 ?0106  ?PROCALCITON  --  0.20 0.26  --  0.49  ?WBC 3.8* 4.1  --  5.5 6.1  ?LATICACIDVEN  --  1.4 3.6*  --  3.7*  ? ? ?Liver Function Tests: ?No results for input(s): AST, ALT, ALKPHOS, BILITOT, PROT, ALBUMIN in the last 168 hours. ?No results for input(s): LIPASE, AMYLASE in the last 168 hours. ?Recent Labs  ?Lab 05/12/21 ?1634  ?AMMONIA 10  ? ? ?ABG ?   ?Component Value Date/Time  ? PHART 7.53 (H) 05/12/2021 2206  ? PCO2ART 26 (L) 05/12/2021 2206  ? PO2ART 80 (L) 05/12/2021 2206  ? HCO3 21.7 05/12/2021 2206  ? ACIDBASEDEF 18.8 (H) 02/04/2021 0919  ? O2SAT 98.4 05/12/2021 2206  ?  ? ?Coagulation Profile: ?No results for input(s): INR, PROTIME in the last 168 hours. ? ?Cardiac Enzymes: ?No results for input(s): CKTOTAL, CKMB, CKMBINDEX, TROPONINI in the last 168 hours. ? ?HbA1C: ?Hgb A1c MFr Bld  ?Date/Time Value Ref Range Status  ?02/05/2021 04:53 AM 4.8 4.8 - 5.6 % Final  ?  Comment:  ?  (NOTE) ?Pre diabetes:           5.7%-6.4% ? ?Diabetes:              >6.4% ? ?Glycemic control for   <7.0% ?adults with diabetes ?  ? ? ?CBG: ?Recent Labs  ?Lab 05/10/21 ?0013 05/10/21 ?0424 05/10/21 ?9833 05/12/21 ?2200 05/13/21 ?8250  ?GLUCAP 1

## 2021-05-13 NOTE — Procedures (Signed)
Arterial Catheter Insertion Procedure Note ?Charles Schmitt. ?[id number] ?02-21-1955 ? ?Procedure: Insertion of Arterial Catheter  ?Indications: Blood pressure monitoring and Frequent blood sampling ? ?Procedure Details ?Consent: Unable to obtain consent because of altered level of consciousness. ?Time Out: Verified patient identification, verified procedure, site/side was marked, verified correct patient position, special equipment/implants available, medications/allergies/relevent history reviewed, required imaging and test results available.  Performed ? ?Maximum sterile technique was used including antiseptics, cap, gloves, gown, hand hygiene, mask, and sheet. ?Skin prep: Chlorhexidine; local anesthetic administered ?20 gauge catheter was inserted into right radial artery using the Seldinger technique. ? ?Evaluation ?Blood flow good; BP tracing good. ?Complications: No apparent complications. ? ? ?BIOPATCH applied to the insertion site. ? ?Harlon Ditty, AGACNP-BC ?[facility] ?Prefer epic messenger for cross cover needs ?If after hours, please call E-link ? ?Charles Schmitt ?05/13/2021 ? ?

## 2021-05-13 NOTE — Progress Notes (Signed)
OT worked with patient and told me she felt that patient was worse, that patient could not help with transfer at all and that was a change and MD was notified. Later the patient was in bed you could barely make out what patient was trying to say and patient was more lethargic and started drooling, MD was again notified. Patient also was not eating or drinking anything and was offered many different times.  ?

## 2021-05-13 NOTE — Progress Notes (Addendum)
CODE SEPSIS - PHARMACY COMMUNICATION ? ?**Broad Spectrum Antibiotics should be administered within 1 hour of Sepsis diagnosis** ? ?Time Code Sepsis Called/Page Received: 0208 ? ?Antibiotics Ordered: Cefepime , Flagyl, Vancomycin ? ?Time of 1st antibiotic administration: 0302 ? ?Otelia Sergeant, PharmD, MBA ?05/13/2021 ?2:12 AM ? ?

## 2021-05-13 NOTE — Progress Notes (Addendum)
OT Cancellation Note ? ?Patient Details ?Name: Charles Schmitt. ?MRN: [id number] ?DOB: [birth year] ? ? ?Cancelled Treatment:    Reason Eval/Treat Not Completed: Patient not medically ready. Per chart review, pt noted to have had rapid response on evening of 4/3, has transitioned to higher level care and intubated. Pt also found to have b/l PE. Per therapy protocols, will require new orders to continue therapy services. OT to sign off at this time. Please re-consult when pt is medically appropriate.    ? ?Matthew Folks, OTR/L ?[facility] [phone] ? ?

## 2021-05-13 NOTE — Progress Notes (Signed)
Inpatient Rehab Admissions Coordinator:  ? ? Note change in status, Pt. Now on vent. CIR will sign off.  ? ?Megan Salon, MS, CCC-SLP ?Rehab Admissions Coordinator  ?[phone] (celll) ?[phone] (office) ? ?

## 2021-05-13 NOTE — Progress Notes (Signed)
ANTICOAGULATION CONSULT NOTE  ? ?Pharmacy Consult for IV heparin ?Indication: DVT ? ?No Known Allergies ? ?Patient Measurements: ?Height: '6\' 5"'  (195.6 cm) ?Weight: 59.3 kg (130 lb 11.7 oz) ?IBW/kg (Calculated) : 89.1 ?Heparin Dosing Weight: 59.3 kg ? ?Vital Signs: ?Temp: 99.9 ?F (37.7 ?C) (04/04 1145) ?Temp Source: Esophageal (04/04 1000) ?BP: 75/61 (04/04 1100) ?Pulse Rate: 29 (04/04 0900) ? ?Labs: ?Recent Labs  ?  05/12/21 ?1634 05/12/21 ?2227 05/12/21 ?2243 05/13/21 ?0106 05/13/21 ?1610 05/13/21 ?9604  ?HGB 10.8*  --  12.2* 12.1*  --   --   ?HCT 33.0*  --  37.6* 37.5*  --   --   ?PLT 312  --  310 326  --   --   ?APTT  --   --   --   --  30  --   ?LABPROT  --   --   --   --  14.8  --   ?INR  --   --   --   --  1.2  --   ?CREATININE 0.96 1.26*  --   --  1.56*  --   ?TROPONINIHS  --   --   --   --  12 13  ? ? ?Estimated Creatinine Clearance: 40.7 mL/min (A) (by C-G formula based on SCr of 1.56 mg/dL (H)). ? ? ?Medical History: ?Past Medical History:  ?Diagnosis Date  ? Alcohol abuse   ? a. 1/5 of vodka daily.  ? Cardiomyopathy ([place])   ? a. 01/2021 Echo: EF 30-35%, glob HK, apical AK. Mildly reduced RV fxn.  ? HFrEF (heart failure with reduced ejection fraction) ([place])   ? Hyperlipidemia   ? Hypertension   ? PAF (paroxysmal atrial fibrillation) ([place])   ? Tobacco abuse   ? ? ?Medications:  ?Medications Prior to Admission  ?Medication Sig Dispense Refill Last Dose  ? folic acid (FOLVITE) 1 MG tablet Take 1 tablet (1 mg total) by mouth once daily. 30 tablet 1 Past Week  ? ivabradine (CORLANOR) 5 MG TABS tablet Take 2 tablets (68m) TWO hours prior to your cardiac CT in addition to the metoprolol tartrate. (Patient not taking: Reported on 05/03/2021) 2 tablet 0 Not Taking  ? metoprolol succinate (TOPROL-XL) 25 MG 24 hr tablet Take (1/2) tablet (12.5 mg total) by mouth once daily. 45 tablet 3 Past Week  ? thiamine (VITAMIN B-1) 100 MG tablet Take 1 tablet (100 mg total) by mouth once daily. 30 tablet 2 Past Week  ?  metoprolol tartrate (LOPRESSOR) 100 MG tablet Take 1 tablet (100 mg total) by mouth once for 1 dose. (Take 2 hours prior to your test). 1 tablet 0   ? spironolactone (ALDACTONE) 25 MG tablet Take 0.5 tablets (12.5 mg total) by mouth once daily. (Patient not taking: Reported on 05/03/2021) 45 tablet 3 Not Taking  ? ?Scheduled:  ? chlorhexidine gluconate (MEDLINE KIT)  15 mL Mouth Rinse BID  ? Chlorhexidine Gluconate Cloth  6 each Topical Daily  ? docusate  100 mg Per Tube BID  ? [START ON 05/14/2021] feeding supplement (PROSource TF)  45 mL Per Tube Daily  ? fentaNYL (SUBLIMAZE) injection  50 mcg Intravenous Once  ? [START ON 45/05/979]folic acid  1 mg Per Tube Daily  ? free water  30 mL Per Tube Q4H  ? hydrocortisone sod succinate (SOLU-CORTEF) inj  100 mg Intravenous Q12H  ? lidocaine  1 patch Transdermal Q24H  ? mouth rinse  15 mL Mouth Rinse 10 times per day  ? [  START ON 05/14/2021] multivitamin with minerals  1 tablet Per Tube Daily  ? pantoprazole sodium  40 mg Per Tube Daily  ? polyethylene glycol  17 g Per Tube Daily  ? thiamine injection  100 mg Intravenous Daily  ? Or  ? thiamine  100 mg Oral Daily  ? ?Infusions:  ? sodium chloride Stopped (05/09/21 1411)  ? dexmedetomidine (PRECEDEX) IV infusion Stopped (05/13/21 1023)  ? feeding supplement (VITAL 1.5 CAL)    ? norepinephrine (LEVOPHED) Adult infusion 22 mcg/min (05/13/21 1200)  ? piperacillin-tazobactam (ZOSYN)  IV    ? vasopressin    ? ?PRN: sodium chloride, acetaminophen, fentaNYL (SUBLIMAZE) injection, fentaNYL (SUBLIMAZE) injection, midazolam, midazolam, ondansetron (ZOFRAN) IV ?Anti-infectives (From admission, onward)  ? ? Start     Dose/Rate Route Frequency Ordered Stop  ? 05/13/21 2100  ceFEPIme (MAXIPIME) 2 g in sodium chloride 0.9 % 100 mL IVPB  Status:  Discontinued       ? 2 g ?200 mL/hr over 30 Minutes Intravenous Every 12 hours 05/13/21 0210 05/13/21 0326  ? 05/13/21 2100  ceFEPIme (MAXIPIME) 2 g in sodium chloride 0.9 % 100 mL IVPB  Status:   Discontinued       ? 2 g ?200 mL/hr over 30 Minutes Intravenous Every 12 hours 05/13/21 0326 05/13/21 0845  ? 05/13/21 1500  piperacillin-tazobactam (ZOSYN) IVPB 3.375 g       ? 3.375 g ?12.5 mL/hr over 240 Minutes Intravenous Every 8 hours 05/13/21 0853    ? 05/13/21 0400  ceFEPIme (MAXIPIME) 2 g in sodium chloride 0.9 % 100 mL IVPB  Status:  Discontinued       ? 2 g ?200 mL/hr over 30 Minutes Intravenous Every 12 hours 05/13/21 0106 05/13/21 0210  ? 05/13/21 0300  vancomycin (VANCOCIN) IVPB 1000 mg/200 mL premix  Status:  Discontinued       ? 1,000 mg ?200 mL/hr over 60 Minutes Intravenous  Once 05/13/21 0205 05/13/21 0213  ? 05/13/21 0230  ceFEPIme (MAXIPIME) 2 g in sodium chloride 0.9 % 100 mL IVPB       ? 2 g ?200 mL/hr over 30 Minutes Intravenous  Once 05/13/21 0205 05/13/21 0349  ? 05/13/21 0200  vancomycin (VANCOREADY) IVPB 1500 mg/300 mL  Status:  Discontinued       ? 1,500 mg ?150 mL/hr over 120 Minutes Intravenous  Once 05/13/21 0106 05/13/21 0110  ? 05/13/21 0200  vancomycin (VANCOCIN) IVPB 1000 mg/200 mL premix  Status:  Discontinued       ? 1,000 mg ?200 mL/hr over 60 Minutes Intravenous Every 24 hours 05/13/21 0110 05/13/21 0845  ? 05/13/21 0045  metroNIDAZOLE (FLAGYL) IVPB 500 mg  Status:  Discontinued       ? 500 mg ?100 mL/hr over 60 Minutes Intravenous Every 12 hours 05/12/21 2357 05/13/21 0205  ? ?  ? ?No PTA meds per med rec and my chart review ? ?Assessment: ?65YOM initially presented after fall at home with traumatic SAH and SDH in the setting of alcohol intoxication. PMH paroxysmal Afib, HTN, dCHF. Overnight on 4/4, was readmitted to the ICU due to change in mental status and noted to have left leg swollen. Heparin started and then d/c'd early AM 4/4. Then, venous ultrasound confirmed DVT. Pharmacy has been consulted to restart IV heparin. ? ?BL labs: Hgb 12.1, Plts 326,  ? ?Goal of Therapy:  ?Heparin level 0.3-0.7 units/ml ?Monitor platelets by anticoagulation protocol: Yes ?  ?Plan:  ?No  bolus due to recent acute  traumatic subdural or subarachnoid hemorrhage (though noted to be stable on multiple images) ?Start heparin infusion 1,000 units/hr ?Heparin level in 6 hours ?Daily CBC ? ? ? ? ?Wynelle Cleveland, PharmD ?Pharmacy Resident  ?05/13/2021 ?12:00 PM ? ? ? ?

## 2021-05-13 NOTE — Progress Notes (Signed)
Pharmacy Antibiotic Note ? ?Charles Schmitt. is a 66 y.o. male admitted on 05/03/2021 with possible aspiration PNA and cellulitis. Pharmacy has been consulted for zosyn dosing. ? ?Febrile, WBC WNL. CTA chest showing remote PE and PNA with debris consistent with aspiration. Scr increased from 0.78 > 1.56 over past 2 days.  ? ?Plan: Start Zosyn 3.375 grams every 8 hours ? ?Monitor renal function, course, length of therapy ? ?Height: 6\' 5"  (195.6 cm) ?Weight: 59.3 kg (130 lb 11.7 oz) ?IBW/kg (Calculated) : 89.1 ? ?Temp (24hrs), Avg:99.2 ?F (37.3 ?C), Min:96.8 ?F (36 ?C), Max:100.4 ?F (38 ?C) ? ?Recent Labs  ?Lab 05/07/21 ?1412 05/08/21 ?0630 05/09/21 ?[id number] 05/10/21 ?[id number] 05/11/21 ?0454 05/12/21 ?1634 05/12/21 ?2227 05/12/21 ?2243 05/13/21 ?0106 05/13/21 ?[id number] 05/13/21 ?[id number]  ?WBC 3.2* 3.8*  --   --   --  4.1  --  5.5 6.1  --   --   ?CREATININE  --  0.73   < > 0.77 0.78 0.96 1.26*  --   --  1.56*  --   ?LATICACIDVEN  --   --   --   --   --  1.4 3.6*  --  3.7* 4.8* 4.1*  ? < > = values in this interval not displayed.  ? ?  ?Estimated Creatinine Clearance: 40.7 mL/min (A) (by C-G formula based on SCr of 1.56 mg/dL (H)).   ? ?No Known Allergies ? ?Antimicrobials this admission: ?4/04 metronidazole + cefepime + vancomycin >> Pip/tazo ? ?Microbiology results: ?4/04 BCx: Pending ?4/4 MRSA PCR: not detected ? ?Thank you for allowing pharmacy to be a part of this patient?s care. ? ? ?Wynelle Cleveland, PharmD ?Pharmacy Resident  ?05/13/2021 ?8:51 AM ? ? ?

## 2021-05-13 NOTE — Progress Notes (Signed)
Brief PCCM Progress Note ? ?Met with patient's son and daughter-in-law and discussed his deterioration overnight. They are in agreement that CPR/shocks/ACLS in event of cardiac arrest would probably only prolong suffering if it happened. He is now DNR/limited (did not explicitly discuss intubation). They wish to continue current supportive measures and regroup with family meeting on Friday to see if we're making progress from a brain recovery standpoint (ideally following commands), from a circulatory standpoint (decreased pressor need), ventilatory standpoint (minimal vent settings). They would like to speak with palliative care as well. ? ?Charles Schmitt  ?[place] ?  ?

## 2021-05-13 NOTE — Progress Notes (Signed)
Eeg done 

## 2021-05-13 NOTE — Progress Notes (Signed)
?   05/12/21 2145  ?Assess: MEWS Score  ?Temp 99.7 ?F (37.6 ?C)  ?BP 100/80  ?Pulse Rate (!) 132  ?Resp (!) 36  ?Level of Consciousness Unresponsive  ?SpO2 100 %  ?O2 Device Room Air  ?Patient Activity (if Appropriate) In bed  ?Assess: MEWS Score  ?MEWS Temp 0  ?MEWS Systolic 1  ?MEWS Pulse 3  ?MEWS RR 3  ?MEWS LOC 3  ?MEWS Score 10  ?MEWS Score Color Red  ?Assess: if the MEWS score is Yellow or Red  ?Were vital signs taken at a resting state? Yes  ?Focused Assessment Change from prior assessment (see assessment flowsheet)  ?Early Detection of Sepsis Score *See Row Information* High  ?MEWS guidelines implemented *See Row Information* Yes  ?Treat  ?MEWS Interventions Other (Comment)  ?Pain Scale 0-10  ?Pain Score 0  ?Take Vital Signs  ?Increase Vital Sign Frequency  Yellow: Q 2hr X 2 then Q 4hr X 2, if remains yellow, continue Q 4hrs  ?Escalate  ?MEWS: Escalate Yellow: discuss with charge nurse/RN and consider discussing with provider and RRT  ?Notify: Charge Nurse/RN  ?Name of Charge Nurse/RN Notified Phylis, RN  ?Date Charge Nurse/RN Notified 05/12/21  ?Time Charge Nurse/RN Notified 2150  ?Notify: Provider  ?Provider Name/Title Ricki Miller, NP  ?Date Provider Notified 05/12/21  ?Time Provider Notified 2132  ?Notification Type Page  ?Notification Reason Change in status  ?Provider response En route  ?Date of Provider Response 05/12/21  ?Time of Provider Response 2132  ?Notify: Rapid Response  ?Name of Rapid Response RN Notified Levonne Spiller, RN  ?Date Rapid Response Notified 05/12/21  ?Time Rapid Response Notified 2145  ?Document  ?Patient Outcome Transferred/level of care increased  ?Assess: SIRS CRITERIA  ?SIRS Temperature  0  ?SIRS Pulse 1  ?SIRS Respirations  1  ?SIRS WBC 0  ?SIRS Score Sum  2  ? ?Pt MEWS turned from green to yellow to red. Rapid Response Called. Blood work and repeat CT ordered. Pt transferred to ICU. RN attempted to notify family, no answer.  ?

## 2021-05-13 NOTE — Progress Notes (Signed)
?                                                   ?Palliative Care Progress Note, Assessment & Plan  ? ?Patient Name: Charles Schmitt.       Date: 05/13/2021 ?DOB: 28-Feb-1955  Age: 66 y.o. MRN#: [id number] ?Attending Physician: Maryjane Hurter, MD ?Primary Care Physician: Pcp, No ?Admit Date: 05/03/2021 ? ?Reason for Consultation/Follow-up: Establishing goals of care ? ?Subjective: ?Patient is lying in bed on mechanical ventilation with sedation.  No family at bedside. ? ?HPI: ?66 y.o. male  with past medical history of etoh abuse, chronic pAF (no AC), cardiomyopathy, HTN, protein calorie malnutrition admitted on 05/03/2021 s/p fall at home with subarachnoid hemorrhage. Serial CTs reveals 16m midline shift with no neurosurgical intervention warranted. Neuro cleared pt for PT/OT and DVT prophylaxis. Pt was on librium taper for alcohol withdrawal with CIWAs = 0-1. He was being evaluated for CIR but family/support cannot provide adequate level of supervision once home post CIR. SNF ned approval and placement pending. ? ?On 4/3 rapid response was called due to pt becoming minimally responsive. Repeat head CT showed no significant change of hematoma or midline shift. On 4/4 patient was minimally responsive with tachycardia, tachypnea, and hypotension. Pt transferred to ICU and intubated/sedated. ? ?CCM Dr. MVerlee Montediscussed overnight deterioration with patient's son/NOK DOminic and his wife LRoderic Ovens Pt code status changed to DNR/limited. PMT was asked to join team and family on these goals of care discussions.       ? ?Summary of counseling/coordination of care: ?After reviewing the patient's chart and assessing the patient at bedside, I attempted to speak with patient's son Dominic.  No answer and unable to leave a voicemail.  I spoke with patient's daughter-in-law LRoderic Ovens  ? ?I  introduced Palliative Medicine as specialized medical care for people living with serious illness. It focuses on providing relief from the symptoms and stress of a serious illness. The goal is to improve quality of life for both the patient and the family. ? ?We discussed that I met with patient when he was alert and awake throughout the week last week. No brief review of patient's life was discussed. ? ?We discussed patient's current illness and what it means in the larger context of patient's on-going co-morbidities. I outlined that the plan is to watch the patient for improvements and to determine what would be in his best interest by this Friday 4/7. WE reviewed that patient would continually monitored for signs of neurological improvement as well as his ability to maintain his breathing, blood pressures, and circulation if weening is feasible.  ? ?LRoderic Ovenssaid she understands everything that is going on with her father law since she just spoke with the ICU doctors. She says she appreciates everything the medical team is doing to help him. She does not have any further questions or concerns at this time.  ? ?Therapeutic silence and active listening provided for Latosha to share her thoughts and emotions regarding her father-in-law's current health status.  She says she just kept telling him that he needs to wake up and come on out of this. We discussed the power of positive thinking and hoping for the best.  ? ?Family is facing treatment option decisions. Discussed with LRoderic Ovensthe importance of continued conversation with family and the medical  providers regarding overall plan of care and treatment options, ensuring decisions are within the context of the patient?s values and GOCs.   ? ?I am on service tomorrow and will continue to monitor the patient throughout his hospitalization.  Roderic Ovens has PMT contact info should she or family have any palliative questions or concerns. ?  ?Code Status: ?Limited  code ? ?Prognosis: ?Unable to determine ? ?Discharge Planning: ?To Be Determined ? ?Care plan was discussed with Dr. Verlee Monte, NP Dewaine Conger, patient's DIL Roderic Ovens ? ?Physical Exam ?Vitals and nursing note reviewed.  ?HENT:  ?   Head: Normocephalic.  ?   Mouth/Throat:  ?   Comments: ET tube in place ?Cardiovascular:  ?   Rate and Rhythm: Tachycardia present.  ?   Pulses: Normal pulses.  ?Pulmonary:  ?   Comments: MV ?Musculoskeletal:  ?   Comments: Ventilated and sedated  ?         ? ?Palliative Assessment/Data: 10-20% ? ? ? ?Total Time 45 minutes  ?Greater than 50%  of this time was spent counseling and coordinating care related to the above assessment and plan. ? ?Thank you for allowing the Palliative Medicine Team to assist in the care of this patient. ? ?Verdell Carmine. Ioana Louks, DNP, FNP-BC ?Palliative Medicine Team ?Team Phone # [phone] ?  ?

## 2021-05-13 NOTE — Progress Notes (Signed)
[facility] P[facility]M ? ?Bedside echo: parasternal views worrisome for RV overload ?Continue management as outlined in note ?Pursue CT angiogram chest, continue heparin no bolus for now ? ?Heber Hopkins, MD ?[facility] ?Pager: [phone] ?Cell: [phone] ?After 7:00 pm call Elink  [phone]  ?

## 2021-05-14 ENCOUNTER — Inpatient Hospital Stay (HOSPITAL_COMMUNITY)
Admit: 2021-05-14 | Discharge: 2021-05-14 | Disposition: A | Discharge status: Home / Self Care | Payer: Medicaid Other | Attending: Pulmonary Disease | Admitting: Pulmonary Disease

## 2021-05-14 ENCOUNTER — Telehealth: Payer: Self-pay | Admitting: Cardiology

## 2021-05-14 DIAGNOSIS — R0609 Other forms of dyspnea: Secondary | ICD-10-CM

## 2021-05-14 LAB — BASIC METABOLIC PANEL
Anion gap: 7 (ref 5–15)
BUN: 33 mg/dL — ABNORMAL HIGH (ref 8–23)
CO2: 21 mmol/L — ABNORMAL LOW (ref 22–32)
Calcium: 8.2 mg/dL — ABNORMAL LOW (ref 8.9–10.3)
Chloride: 106 mmol/L (ref 98–111)
Creatinine, Ser: 1.35 mg/dL — ABNORMAL HIGH (ref 0.61–1.24)
GFR, Estimated: 59 mL/min — ABNORMAL LOW (ref >60)
Glucose, Bld: 194 mg/dL — ABNORMAL HIGH (ref 70–99)
Potassium: 4.2 mmol/L (ref 3.5–5.1)
Sodium: 134 mmol/L — ABNORMAL LOW (ref 135–145)

## 2021-05-14 LAB — GLUCOSE, CAPILLARY
Glucose-Capillary: 171 mg/dL — ABNORMAL HIGH (ref 70–99)
Glucose-Capillary: 175 mg/dL — ABNORMAL HIGH (ref 70–99)
Glucose-Capillary: 149 mg/dL — ABNORMAL HIGH (ref 70–99)
Glucose-Capillary: 126 mg/dL — ABNORMAL HIGH (ref 70–99)
Glucose-Capillary: 113 mg/dL — ABNORMAL HIGH (ref 70–99)
Glucose-Capillary: 117 mg/dL — ABNORMAL HIGH (ref 70–99)

## 2021-05-14 LAB — CBC
HCT: 26.9 % — ABNORMAL LOW (ref 39.0–52.0)
Hemoglobin: 9.1 g/dL — ABNORMAL LOW (ref 13.0–17.0)
MCH: 33.8 pg (ref 26.0–34.0)
MCHC: 33.8 g/dL (ref 30.0–36.0)
MCV: 100 fL (ref 80.0–100.0)
Platelets: 261 10*3/uL (ref 150–400)
RBC: 2.69 MIL/uL — ABNORMAL LOW (ref 4.22–5.81)
RDW: 13.9 % (ref 11.5–15.5)
WBC: 24.3 10*3/uL — ABNORMAL HIGH (ref 4.0–10.5)
nRBC: 0 % (ref 0.0–0.2)

## 2021-05-14 LAB — PHOSPHORUS: Phosphorus: 3.5 mg/dL (ref 2.5–4.6)

## 2021-05-14 LAB — ECHOCARDIOGRAM COMPLETE
AR max vel: 2.91 cm2
AV Area VTI: 2.58 cm2
AV Area mean vel: 2.59 cm2
AV Mean grad: 2 mmHg
AV Peak grad: 2.9 mmHg
Ao pk vel: 0.85 m/s
Area-P 1/2: 7.9 cm2
Height: 77 in
MV VTI: 3.71 cm2
S' Lateral: 3.2 cm
Weight: 2067.03 oz

## 2021-05-14 LAB — MAGNESIUM: Magnesium: 2 mg/dL (ref 1.7–2.4)

## 2021-05-14 LAB — PROCALCITONIN: Procalcitonin: 22.06 ng/mL

## 2021-05-14 LAB — HEPARIN LEVEL (UNFRACTIONATED): Heparin Unfractionated: 0.33 IU/mL (ref 0.30–0.70)

## 2021-05-14 MED ORDER — FENTANYL CITRATE PF 50 MCG/ML IJ SOSY
PREFILLED_SYRINGE | INTRAMUSCULAR | Status: AC
  Filled 2021-05-14: qty 2

## 2021-05-14 MED ORDER — THIAMINE HCL 100 MG PO TABS: 100.0000 mg | ORAL_TABLET | Freq: Every day | ORAL | Status: DC

## 2021-05-14 MED ORDER — ETOMIDATE 2 MG/ML IV SOLN
INTRAVENOUS | Status: AC
  Filled 2021-05-14: qty 10

## 2021-05-14 MED ORDER — MIDAZOLAM HCL 2 MG/2ML IJ SOLN
INTRAMUSCULAR | Status: AC
Start: 2021-05-14 — End: 2021-05-14
  Filled 2021-05-14: qty 2

## 2021-05-14 MED ORDER — ROCURONIUM BROMIDE 10 MG/ML (PF) SYRINGE
PREFILLED_SYRINGE | INTRAVENOUS | Status: AC
Start: 2021-05-14 — End: 2021-05-14
  Filled 2021-05-14: qty 10

## 2021-05-14 MED ORDER — NICOTINE 14 MG/24HR TD PT24
14.0000 mg | MEDICATED_PATCH | Freq: Every day | TRANSDERMAL | Status: DC
  Administered 2021-05-14 – 2021-05-22 (×8): 14 mg via TRANSDERMAL
  Filled 2021-05-14 (×8): qty 1

## 2021-05-14 MED ORDER — THIAMINE HCL 100 MG/ML IJ SOLN: 100.0000 mg | Freq: Every day | INTRAMUSCULAR | Status: DC

## 2021-05-14 NOTE — Progress Notes (Signed)
ANTICOAGULATION CONSULT NOTE  ? ?Pharmacy Consult for IV heparin ?Indication: DVT ? ?No Known Allergies ? ?Patient Measurements: ?Height: _0  (195.6 cm) ?Weight: 59.3 kg (130 lb 11.7 oz) ?IBW/kg (Calculated) : 89.1 ?Heparin Dosing Weight: 59.3 kg ? ?Vital Signs: ?Temp: 98.8 ?F (37.1 ?C) (04/05 0245) ?Temp Source: Esophageal (04/04 1800) ?BP: 99/76 (04/05 0245) ?Pulse Rate: 109 (04/05 0215) ? ?Labs: ?Recent Labs  ?  05/12/21 ?2227 05/12/21 ?2243 05/13/21 ?0106 05/13/21 ?3845 05/13/21 ?3646 05/13/21 ?1242 05/13/21 ?1803 05/14/21 ?0403  ?HGB  --  12.2* 12.1*  --   --   --   --  9.1*  ?HCT  --  37.6* 37.5*  --   --   --   --  26.9*  ?PLT  --  310 326  --   --   --   --  261  ?APTT  --   --   --  30  --   --   --   --   ?LABPROT  --   --   --  14.8  --   --   --   --   ?INR  --   --   --  1.2  --   --   --   --   ?HEPARINUNFRC  --   --   --   --   --   --  0.35 0.33  ?CREATININE 1.26*  --   --  1.56*  --   --   --  1.35*  ?CKTOTAL  --   --   --   --   --  157  --   --   ?TROPONINIHS  --   --   --  12 13  --   --   --   ? ? ? ?Estimated Creatinine Clearance: 47 mL/min (A) (by C-G formula based on SCr of 1.35 mg/dL (H)). ? ? ?Medical History: ?Past Medical History:  ?Diagnosis Date  ? Alcohol abuse   ? a. 1/5 of vodka daily.  ? Cardiomyopathy ([place])   ? a. 01/2021 Echo: EF 30-35%, glob HK, apical AK. Mildly reduced RV fxn.  ? HFrEF (heart failure with reduced ejection fraction) ([place])   ? Hyperlipidemia   ? Hypertension   ? PAF (paroxysmal atrial fibrillation) ([place])   ? Tobacco abuse   ? ? ?Medications:  ?Medications Prior to Admission  ?Medication Sig Dispense Refill Last Dose  ? folic acid (FOLVITE) 1 MG tablet Take 1 tablet (1 mg total) by mouth once daily. 30 tablet 1 Past Week  ? ivabradine (CORLANOR) 5 MG TABS tablet Take 2 tablets (7m) TWO hours prior to your cardiac CT in addition to the metoprolol tartrate. (Patient not taking: Reported on 05/03/2021) 2 tablet 0 Not Taking  ? metoprolol succinate (TOPROL-XL) 25  MG 24 hr tablet Take (1/2) tablet (12.5 mg total) by mouth once daily. 45 tablet 3 Past Week  ? thiamine (VITAMIN B-1) 100 MG tablet Take 1 tablet (100 mg total) by mouth once daily. 30 tablet 2 Past Week  ? metoprolol tartrate (LOPRESSOR) 100 MG tablet Take 1 tablet (100 mg total) by mouth once for 1 dose. (Take 2 hours prior to your test). 1 tablet 0   ? spironolactone (ALDACTONE) 25 MG tablet Take 0.5 tablets (12.5 mg total) by mouth once daily. (Patient not taking: Reported on 05/03/2021) 45 tablet 3 Not Taking  ? ?Scheduled:  ? chlorhexidine gluconate (MEDLINE KIT)  15 mL Mouth Rinse BID  ?  Chlorhexidine Gluconate Cloth  6 each Topical Daily  ? docusate  100 mg Per Tube BID  ? etomidate      ? feeding supplement (PROSource TF)  45 mL Per Tube Daily  ? fentaNYL      ? fentaNYL (SUBLIMAZE) injection  50 mcg Intravenous Once  ? folic acid  1 mg Per Tube Daily  ? free water  30 mL Per Tube Q4H  ? hydrocortisone sod succinate (SOLU-CORTEF) inj  100 mg Intravenous Q12H  ? insulin aspart  0-15 Units Subcutaneous Q4H  ? lidocaine  1 patch Transdermal Q24H  ? mouth rinse  15 mL Mouth Rinse 10 times per day  ? midazolam      ? multivitamin with minerals  1 tablet Per Tube Daily  ? pantoprazole sodium  40 mg Per Tube Daily  ? polyethylene glycol  17 g Per Tube Daily  ? rocuronium bromide      ? thiamine injection  100 mg Intravenous Daily  ? Or  ? thiamine  100 mg Oral Daily  ? ?Infusions:  ? sodium chloride Stopped (05/09/21 1411)  ? dexmedetomidine (PRECEDEX) IV infusion Stopped (05/13/21 1023)  ? feeding supplement (VITAL 1.5 CAL) 55 mL/hr at 05/14/21 0000  ? heparin 1,000 Units/hr (05/14/21 0000)  ? norepinephrine (LEVOPHED) Adult infusion 10 mcg/min (05/14/21 0000)  ? piperacillin-tazobactam (ZOSYN)  IV 12.5 mL/hr at 05/14/21 0000  ? vasopressin 0.03 Units/min (05/14/21 0000)  ? ?PRN: sodium chloride, acetaminophen, fentaNYL (SUBLIMAZE) injection, fentaNYL (SUBLIMAZE) injection, midazolam, midazolam, ondansetron  (ZOFRAN) IV ?Anti-infectives (From admission, onward)  ? ? Start     Dose/Rate Route Frequency Ordered Stop  ? 05/13/21 2100  ceFEPIme (MAXIPIME) 2 g in sodium chloride 0.9 % 100 mL IVPB  Status:  Discontinued       ? 2 g ?200 mL/hr over 30 Minutes Intravenous Every 12 hours 05/13/21 0210 05/13/21 0326  ? 05/13/21 2100  ceFEPIme (MAXIPIME) 2 g in sodium chloride 0.9 % 100 mL IVPB  Status:  Discontinued       ? 2 g ?200 mL/hr over 30 Minutes Intravenous Every 12 hours 05/13/21 0326 05/13/21 0845  ? 05/13/21 1500  piperacillin-tazobactam (ZOSYN) IVPB 3.375 g       ? 3.375 g ?12.5 mL/hr over 240 Minutes Intravenous Every 8 hours 05/13/21 0853    ? 05/13/21 0400  ceFEPIme (MAXIPIME) 2 g in sodium chloride 0.9 % 100 mL IVPB  Status:  Discontinued       ? 2 g ?200 mL/hr over 30 Minutes Intravenous Every 12 hours 05/13/21 0106 05/13/21 0210  ? 05/13/21 0300  vancomycin (VANCOCIN) IVPB 1000 mg/200 mL premix  Status:  Discontinued       ? 1,000 mg ?200 mL/hr over 60 Minutes Intravenous  Once 05/13/21 0205 05/13/21 0213  ? 05/13/21 0230  ceFEPIme (MAXIPIME) 2 g in sodium chloride 0.9 % 100 mL IVPB       ? 2 g ?200 mL/hr over 30 Minutes Intravenous  Once 05/13/21 0205 05/13/21 0349  ? 05/13/21 0200  vancomycin (VANCOREADY) IVPB 1500 mg/300 mL  Status:  Discontinued       ? 1,500 mg ?150 mL/hr over 120 Minutes Intravenous  Once 05/13/21 0106 05/13/21 0110  ? 05/13/21 0200  vancomycin (VANCOCIN) IVPB 1000 mg/200 mL premix  Status:  Discontinued       ? 1,000 mg ?200 mL/hr over 60 Minutes Intravenous Every 24 hours 05/13/21 0110 05/13/21 0845  ? 05/13/21 0045  metroNIDAZOLE (FLAGYL) IVPB  500 mg  Status:  Discontinued       ? 500 mg ?100 mL/hr over 60 Minutes Intravenous Every 12 hours 05/12/21 2357 05/13/21 0205  ? ?  ? ?No PTA meds per med rec and my chart review ? ?Assessment: ?65YOM initially presented after fall at home with traumatic SAH and SDH in the setting of alcohol intoxication. PMH paroxysmal Afib, HTN, dCHF.  Overnight on 4/4, was readmitted to the ICU due to change in mental status and noted to have left leg swollen. Heparin started and then d/c'd early AM 4/4. Then, venous ultrasound confirmed DVT. Pharmacy has been consulted to restart IV heparin. ? ?BL labs: Hgb 12.1, Plts 326,  ? ?Goal of Therapy:  ?Heparin level 0.3-0.7 units/ml ?Monitor platelets by anticoagulation protocol: Yes ? ?4/04 1803 HL 0.35, therapeutic x 1 ?4/05 0403 HL 0.33, therapeutic x 2 ?  ?Plan:  ?No bolus due to recent acute traumatic subdural or subarachnoid hemorrhage (though noted to be stable on multiple images) ?Continue heparin infusion @ 1,000 units/hr ?HL daily while therapeutic ?Daily CBC ? ?Renda Rolls, PharmD, MBA ?05/14/2021 ?4:48 AM ? ? ? ? ?

## 2021-05-14 NOTE — Progress Notes (Signed)
Chaplain offered silent prayer and presence and bedside.  ?

## 2021-05-14 NOTE — Progress Notes (Signed)
*  PRELIMINARY RESULTS* ?Echocardiogram ?2D Echocardiogram has been performed. ? ?Charles Schmitt ?05/14/2021, 10:23 AM ?

## 2021-05-14 NOTE — Progress Notes (Signed)
Progress note: ? ?After reviewing patient's chart, assessed patient at bedside.  Patient is intubated but awake and alert.  He is able to raise his right arm on command.  He cannot track with his eyes.  He cannot move his left upper or left lower extremity to command. ? ?Plan is for extubation today.  As per attending Dr. Daphane Shepherd, family is in agreements to reintubate should patient fail extubation.  Goals remain clear-limited code/full scope. ? ?Palliative medicine team will continue to follow patient throughout his hospitalization.   ? ?Dalbert Mayotte. Jhony Antrim, DNP, FNP-BC ?Palliative Medicine Team ?Team Phone # [phone] ? ?NO CHARGE ?

## 2021-05-14 NOTE — Progress Notes (Addendum)
? ?NAME:  Charles Schmitt., MRN:  [id number], DOB:  04-04-1955, LOS: 11 ?ADMISSION DATE:  05/03/2021, CONSULTATION DATE:  4/4 ?REFERRING MD:  Myriam Forehand, CHIEF COMPLAINT:  Change in mental status  ? ?History of Present Illness:  ?This is a 66 y/o male who was admitted on 3/25 after a fall at home with a traumatic SAH and SDH in the setting of alcohol intoxicatino. NSGY was consulted and recommended conservative management. Since admission he has been on the hospitalist service and PT, OT and SLP have been following. He has noted to be weak and unable to perform his ADL's.  He was treated for Alcohol withdrawal with thiamine, folate, vitamins and lithium.  Palliative medicine saw him to discuss goals fo care on 3/28 during which time he remained full code and was focused on being able to leave the hospital.  The patient has been waiting for SNF during his hospitalization.  Paroxysmal atrial fibrillation, hypertension and diastolic heart failure have been managed by the primary service. ?On 4/3 he had a change in mental status to becoming minimally responsive.  A head CT was repeated and showed no significant change in his small right subdural hematoma and 107mm right to left midline shift.   ? ?On 4/4 the patient became minimally responsive, tachycardic, tachypneic, and had soft blood pressure with associated respiratory alkalosis and lactic acidosis.  PCCM was consulted for further evaluation.   ? ?Pertinent  Medical History  ?Alcohol dependence ? ? ?Significant Hospital Events: ?Including procedures, antibiotic start and stop dates in addition to other pertinent events   ?3/25 admission for multiple falls, traumatic SAH and SDH ?3/28 palliative care consultation: full code ?4/4 moved to the ICU for change in mental status and sepsis, left leg is swollen, mottled.  ? ? ?Procedure: ?4/4 ETT > ?4/4 L IJ CVL >  ? ?Micro: ?4/4 blood >  ? ?Abx ?4/4 vanc >  ?4/4 cefepime >  ? ? ?Imaging: ?3/25 lumbar ct> no fracture or  subluxation, hepatic steatosis ?3/25 CT head > Acute frontoparietal R subdural hematoma with 31mm leftward midline shift, three small intraparenchymal hemorrhagic contusion in R frontal lobe with edema, no mass effect, R SAH in sylvian fissure unchanged ?3/28 CT head > no significant change ?4/3 CT head > grossy stable size and appearance of R sided subdural with 21mm R to left midline shift (unchanged compared to prior), SAH on right has nearly resolved, contusions improved ?4/4 CT head > no significant change compared to 4/3, midline shift 54mm ? ? ?Interim History / Subjective:  ?Pressor requirements have come down substantially. He is now tracking this morning and following command to open/close eyes.  ? ?Objective   ?Blood pressure 109/78, pulse (!) 110, temperature 98.4 ?F (36.9 ?C), resp. rate (!) 21, height 6\' 5"  (1.956 m), weight 58.6 kg, SpO2 98 %. ?CVP:  [0 mmHg-10 mmHg] 5 mmHg  ?Vent Mode: PCV ?FiO2 (%):  [50 %-60 %] 50 % ?Set Rate:  [18 bmp] 18 bmp ?PEEP:  [8 cmH20] 8 cmH20 ?Plateau Pressure:  [20 cmH20-22 cmH20] 21 cmH20  ? ?Intake/Output Summary (Last 24 hours) at 05/14/2021 0758 ?Last data filed at 05/14/2021 0400 ?Gross per 24 hour  ?Intake 2163.21 ml  ?Output 680 ml  ?Net 1483.21 ml  ? ?Filed Weights  ? 05/11/21 0447 05/12/21 0500 05/14/21 0500  ?Weight: 58.9 kg 59.3 kg 58.6 kg  ? ? ?Examination: ? ?General appearance: 66 y.o., male, intubated and chronically ill appearing, frail  ?Eyes: PERRL, +tracking ?HENT:  NCAT; MMM ?Neck: Trachea midline; no lymphadenopathy, no JVD ?Lungs: coarse bl equal chest rise ?CV: tachy RR, no murmur  ?Abdomen: Soft, non-tender; non-distended, BS present  ?Extremities: +LLE edema, dark, almost dusky coloration ?Neuro:  triggers breaths, follows command to open/close eyes, moves right side > left side spontaneously ? ? ?DVT US +for large, proximal LLE DVT ? ?Resolved Hospital Problem list   ? ? ?Assessment & Plan:  ?# Septic shock due to HAP/aspiration pneumonia ?He was also  surprisingly hypovolemic/volume responsive over 1st 24h after transferring to ICU. ?- with degree of respiratory variation he has on IVC ultrasound let's do another 500 cc NS and assess response ?- vaso, wean Levophed for MAP > 65 ?- continue stress dose steroids ?- continue zosyn, have dc'd cefepime to avoid confounder of his depressed LOC. Follow cultures and narrow as able ? ?# Acute respiratory failure with hypoxemia and inability to protect airway ?- Full mechanical vent support ?- VAP prevention, PAD protocol, keep sedation light ?- Daily WUA/SBT ? ?# Proximal LLE DVT ?- heparin gtt ?- would consider referral for IVC filter pending recovery from this bout of septic shock/acute metabolic encephalopathy ? ?# HFrEF ?- Formal echo in AM ? ?# Acute metabolic encephalopathy  ?# Alcohol use disorder ?Probably multifactorial with metabolic encephalopathy in setting sepsis SAH/SDH and TBI. EEG without seizure. Improving. ?- PAD protocol: RASS goal 0 to -1, fentanyl prn, versed prn ?- Thiamine, folate to continue ? ?# AKI ?- f/u response to initial resuscitative measures ? ?# Traumatic subdural or subarachnoid hemorrhage > stable on multiple images ?- Supportive care ? ?# Severe physical deconditioning at baseline ?# Moderate-severe protein calorie malnourishment ?- Will need PT consult after extubation, OT ? ?Best Practice (right click and "Reselect all SmartList Selections" daily)  ? ?Diet/type: tubefeeds ?DVT prophylaxis: prophylactic heparin  ?GI prophylaxis: PPI ?Lines: Central line and yes and it is still needed ?Foley:  Yes, and it is still needed ?Code Status:  full code ?Last date of multidisciplinary goals of care discussion [Partial code - No CPR/shocks/ACLS. Had planned family meeting Friday to discuss progress.] ? ?Critical care time: 35 minutes ?  ? ?Judithann Graves  ?[facility] ? ? ? ? ? ?  ?

## 2021-05-14 NOTE — Progress Notes (Signed)
PHARMACY CONSULT NOTE - FOLLOW UP ? ?Pharmacy Consult for Electrolyte Monitoring and Replacement  ? ?Recent Labs: ?Potassium (mmol/L)  ?Date Value  ?05/14/2021 4.2  ? ?Magnesium (mg/dL)  ?Date Value  ?05/14/2021 2.0  ? ?Calcium (mg/dL)  ?Date Value  ?05/14/2021 8.2 (L)  ? ?Albumin (g/dL)  ?Date Value  ?05/13/2021 3.5  ?03/03/2021 3.0 (L)  ? ?Phosphorus (mg/dL)  ?Date Value  ?05/14/2021 3.5  ? ?Sodium (mmol/L)  ?Date Value  ?05/14/2021 134 (L)  ?03/03/2021 145 (H)  ? ?Corrected Calcium: 8.6 mg/dl ? ? ?Assessment: ?65YOM initially presented after fall at home with traumatic SAH and SDH in the setting of alcohol intoxication. PMH paroxysmal Afib, HTN, dCHF. Pharmacy has been consulted to dose electrolytes. ? ?Scr increased 1.26 > 1.56 ? ?Goal of Therapy:  ?Lytes WNL ? ?Plan:  ?All electrolytes WNL ?Follow up labs tomorrow AM ? ?Jaynie Bream, PharmD ?Pharmacy Resident  ?05/14/2021 ?7:20 AM ? ?

## 2021-05-14 NOTE — Progress Notes (Signed)
?   05/14/21 2200  ?Provider Notification  ?Provider Name/Title Dr. Thora Lance  ?Date Provider Notified 05/14/21  ?Time Provider Notified 2200  ?Method of Notification Face-to-face  ?Notification Reason Other (Comment) ?(Patient's right radial art line no longer reading properly. Art line zeroed and flushed. Dressing also reinforced. Wave-form present but arterial blood pressures not accurate. Pressors weaned off and blood pressure and MAP within MD ordered parameters.)  ?Provider response Other (Comment) ?(verbal order recieved to discontinue right arterial line)  ?Date of Provider Response 05/14/21  ?Time of Provider Response 2200  ? ?Derrill Kay MSN, RN ?

## 2021-05-14 NOTE — Progress Notes (Addendum)
Pharmacy Antibiotic Note ? ?Charles Schmitt. is a 66 y.o. male admitted on 05/03/2021 with possible aspiration PNA and cellulitis. Pharmacy has been consulted for zosyn dosing. ? ?Day 2 of Zosyn. WBC 6.1 > 24.3, however IV steroids were also started on 4/4.  TMAX 100.4.  ? ?Plan: Continue Zosyn 3.375 grams every 8 hours ? ?Monitor renal function, course, length of therapy ? ?Height: 6\' 5"  (195.6 cm) ?Weight: 58.6 kg (129 lb 3 oz) ?IBW/kg (Calculated) : 89.1 ? ?Temp (24hrs), Avg:99.7 ?F (37.6 ?C), Min:98.4 ?F (36.9 ?C), Max:100.6 ?F (38.1 ?C) ? ?Recent Labs  ?Lab 05/08/21 ?0630 05/09/21 ?05/11/21 05/11/21 ?0454 05/12/21 ?1634 05/12/21 ?2227 05/12/21 ?2243 05/13/21 ?0106 05/13/21 ?07/13/21 05/13/21 ?07/13/21 05/14/21 ?0403  ?WBC 3.8*  --   --  4.1  --  5.5 6.1  --   --  24.3*  ?CREATININE 0.73   < > 0.78 0.96 1.26*  --   --  1.56*  --  1.35*  ?LATICACIDVEN  --   --   --  1.4 3.6*  --  3.7* 4.8* 4.1*  --   ? < > = values in this interval not displayed.  ? ?  ?Estimated Creatinine Clearance: 46.4 mL/min (A) (by C-G formula based on SCr of 1.35 mg/dL (H)).   ? ?No Known Allergies ? ?Antimicrobials this admission: ?4/04 metronidazole + cefepime + vancomycin >> Pip/tazo ? ?Microbiology results: ?4/04 BCx: NG < 12 hours ?4/4 MRSA PCR: not detected ? ?Thank you for allowing pharmacy to be a part of this patient?s care. ? ? ?6/4, PharmD ?Pharmacy Resident  ?05/14/2021 ?7:23 AM ? ? ?

## 2021-05-14 NOTE — Progress Notes (Signed)
Pt extubated per MD order by RT. Pt following commands, VSS, RT extubated pt and placed pt on 3L via . Pt in no acute distress. Family at bedside.  ?

## 2021-05-14 NOTE — Telephone Encounter (Signed)
Office calling to say they are sending paperwork over for the patient. That needs to be sign by Charles Schmitt to say patient dont have income. Please advise ?

## 2021-05-14 NOTE — Plan of Care (Signed)
Patient is extubated to 3 lpm nasal canula ?

## 2021-05-15 ENCOUNTER — Inpatient Hospital Stay (HOSPITAL_COMMUNITY)
Admit: 2021-05-15 | Discharge: 2021-05-15 | Disposition: A | Discharge status: Home / Self Care | Payer: Medicaid Other | Attending: Student | Admitting: Student

## 2021-05-15 ENCOUNTER — Inpatient Hospital Stay: Payer: Medicaid Other

## 2021-05-15 DIAGNOSIS — Z515 Encounter for palliative care: Secondary | ICD-10-CM

## 2021-05-15 DIAGNOSIS — J9601 Acute respiratory failure with hypoxia: Secondary | ICD-10-CM

## 2021-05-15 DIAGNOSIS — I609 Nontraumatic subarachnoid hemorrhage, unspecified: Secondary | ICD-10-CM

## 2021-05-15 DIAGNOSIS — R6521 Severe sepsis with septic shock: Secondary | ICD-10-CM

## 2021-05-15 DIAGNOSIS — R008 Other abnormalities of heart beat: Secondary | ICD-10-CM

## 2021-05-15 LAB — CBC
HCT: 26.7 % — ABNORMAL LOW (ref 39.0–52.0)
Hemoglobin: 8.6 g/dL — ABNORMAL LOW (ref 13.0–17.0)
MCH: 32.6 pg (ref 26.0–34.0)
MCHC: 32.2 g/dL (ref 30.0–36.0)
MCV: 101.1 fL — ABNORMAL HIGH (ref 80.0–100.0)
Platelets: 252 10*3/uL (ref 150–400)
RBC: 2.64 MIL/uL — ABNORMAL LOW (ref 4.22–5.81)
RDW: 14.1 % (ref 11.5–15.5)
WBC: 22.1 10*3/uL — ABNORMAL HIGH (ref 4.0–10.5)
nRBC: 0.1 % (ref 0.0–0.2)

## 2021-05-15 LAB — HEMOGLOBIN A1C
Hgb A1c MFr Bld: 5.2 % (ref 4.8–5.6)
Mean Plasma Glucose: 103 mg/dL

## 2021-05-15 LAB — HEPARIN LEVEL (UNFRACTIONATED)
Heparin Unfractionated: 0.11 IU/mL — ABNORMAL LOW (ref 0.30–0.70)
Heparin Unfractionated: 0.22 IU/mL — ABNORMAL LOW (ref 0.30–0.70)
Heparin Unfractionated: 0.23 IU/mL — ABNORMAL LOW (ref 0.30–0.70)

## 2021-05-15 LAB — PHOSPHORUS: Phosphorus: 3.5 mg/dL (ref 2.5–4.6)

## 2021-05-15 LAB — ECHOCARDIOGRAM LIMITED
Height: 77 in
S' Lateral: 2.73 cm
Weight: 1139.34 oz

## 2021-05-15 LAB — GLUCOSE, CAPILLARY
Glucose-Capillary: 129 mg/dL — ABNORMAL HIGH (ref 70–99)
Glucose-Capillary: 112 mg/dL — ABNORMAL HIGH (ref 70–99)
Glucose-Capillary: 108 mg/dL — ABNORMAL HIGH (ref 70–99)
Glucose-Capillary: 125 mg/dL — ABNORMAL HIGH (ref 70–99)
Glucose-Capillary: 129 mg/dL — ABNORMAL HIGH (ref 70–99)
Glucose-Capillary: 110 mg/dL — ABNORMAL HIGH (ref 70–99)
Glucose-Capillary: 111 mg/dL — ABNORMAL HIGH (ref 70–99)
Glucose-Capillary: 87 mg/dL (ref 70–99)

## 2021-05-15 LAB — BASIC METABOLIC PANEL
Anion gap: 9 (ref 5–15)
BUN: 35 mg/dL — ABNORMAL HIGH (ref 8–23)
CO2: 26 mmol/L (ref 22–32)
Calcium: 8.4 mg/dL — ABNORMAL LOW (ref 8.9–10.3)
Chloride: 103 mmol/L (ref 98–111)
Creatinine, Ser: 1.12 mg/dL (ref 0.61–1.24)
GFR, Estimated: >60 mL/min (ref >60)
Glucose, Bld: 127 mg/dL — ABNORMAL HIGH (ref 70–99)
Potassium: 3.1 mmol/L — ABNORMAL LOW (ref 3.5–5.1)
Sodium: 138 mmol/L (ref 135–145)

## 2021-05-15 LAB — MAGNESIUM: Magnesium: 2.6 mg/dL — ABNORMAL HIGH (ref 1.7–2.4)

## 2021-05-15 MED ORDER — PROSOURCE TF PO LIQD
45.0000 mL | Freq: Every day | ORAL | Status: DC
  Filled 2021-05-15: qty 45

## 2021-05-15 MED ORDER — PROSOURCE TF PO LIQD
45.0000 mL | Freq: Two times a day (BID) | ORAL | Status: DC
  Administered 2021-05-15 – 2021-05-20 (×9): 45 mL
  Filled 2021-05-15 (×10): qty 45

## 2021-05-15 MED ORDER — ORAL CARE MOUTH RINSE
15.0000 mL | Freq: Two times a day (BID) | OROMUCOSAL | Status: DC
  Administered 2021-05-15 – 2021-05-22 (×16): 15 mL via OROMUCOSAL

## 2021-05-15 MED ORDER — ADULT MULTIVITAMIN W/MINERALS CH
1.0000 | ORAL_TABLET | Freq: Every day | ORAL | Status: DC
  Administered 2021-05-15: 1 via ORAL
  Filled 2021-05-15: qty 1

## 2021-05-15 MED ORDER — THIAMINE HCL 100 MG/ML IJ SOLN
100.0000 mg | Freq: Every day | INTRAMUSCULAR | Status: DC
  Administered 2021-05-15: 100 mg via INTRAVENOUS
  Filled 2021-05-15: qty 2

## 2021-05-15 MED ORDER — POTASSIUM CHLORIDE 10 MEQ/50ML IV SOLN
10.0000 meq | INTRAVENOUS | Status: AC
  Administered 2021-05-15 (×4): 10 meq via INTRAVENOUS
  Filled 2021-05-15 (×4): qty 50

## 2021-05-15 MED ORDER — DOCUSATE SODIUM 100 MG PO CAPS
100.0000 mg | ORAL_CAPSULE | Freq: Two times a day (BID) | ORAL | Status: DC
  Filled 2021-05-15: qty 1

## 2021-05-15 MED ORDER — VITAL 1.5 CAL PO LIQD: 1000.0000 mL | ORAL | Status: DC

## 2021-05-15 MED ORDER — THIAMINE HCL 100 MG PO TABS: 100.0000 mg | ORAL_TABLET | Freq: Every day | ORAL | Status: DC

## 2021-05-15 MED ORDER — POTASSIUM CHLORIDE 20 MEQ PO PACK: 40.0000 meq | PACK | Freq: Two times a day (BID) | ORAL | Status: DC

## 2021-05-15 MED ORDER — PERFLUTREN LIPID MICROSPHERE
1.0000 mL | INTRAVENOUS | Status: AC | PRN
  Administered 2021-05-15: 3 mL via INTRAVENOUS
  Filled 2021-05-15: qty 10

## 2021-05-15 MED ORDER — PANTOPRAZOLE SODIUM 40 MG PO TBEC
40.0000 mg | DELAYED_RELEASE_TABLET | Freq: Every day | ORAL | Status: DC
Start: 2021-05-15 — End: 2021-05-17
  Administered 2021-05-15: 40 mg via ORAL
  Filled 2021-05-15: qty 1

## 2021-05-15 MED ORDER — POLYETHYLENE GLYCOL 3350 17 G PO PACK
17.0000 g | PACK | Freq: Every day | ORAL | Status: DC
  Filled 2021-05-15: qty 1

## 2021-05-15 MED ORDER — OSMOLITE 1.2 CAL PO LIQD: 1000.0000 mL | ORAL | Status: DC

## 2021-05-15 MED ORDER — ACETAMINOPHEN 325 MG PO TABS
650.0000 mg | ORAL_TABLET | ORAL | Status: DC | PRN
Start: 2021-05-15 — End: 2021-05-20

## 2021-05-15 MED ORDER — OSMOLITE 1.5 CAL PO LIQD
1000.0000 mL | ORAL | Status: DC
  Administered 2021-05-15 – 2021-05-19 (×3): 1000 mL

## 2021-05-15 MED ORDER — CHLORHEXIDINE GLUCONATE 0.12 % MT SOLN
15.0000 mL | Freq: Two times a day (BID) | OROMUCOSAL | Status: DC
  Administered 2021-05-15 – 2021-05-22 (×15): 15 mL via OROMUCOSAL
  Filled 2021-05-15 (×12): qty 15

## 2021-05-15 MED ORDER — FREE WATER
30.0000 mL | Status: DC
  Administered 2021-05-15 – 2021-05-17 (×6): 30 mL

## 2021-05-15 MED ORDER — FOLIC ACID 1 MG PO TABS
1.0000 mg | ORAL_TABLET | Freq: Every day | ORAL | Status: DC
  Administered 2021-05-15: 1 mg via ORAL
  Filled 2021-05-15: qty 1

## 2021-05-15 NOTE — Plan of Care (Signed)
?  Problem: Education: ?Goal: Knowledge of General Education information will improve ?Description: Including pain rating scale, medication(s)/side effects and non-pharmacologic comfort measures ?Outcome: Not Progressing ?  ?Problem: Health Behavior/Discharge Planning: ?Goal: Ability to manage health-related needs will improve ?Outcome: Not Progressing ?  ?Problem: Clinical Measurements: ?Goal: Ability to maintain clinical measurements within normal limits will improve ?Outcome: Not Progressing ?Goal: Will remain free from infection ?Outcome: Not Progressing ?Goal: Diagnostic test results will improve ?Outcome: Not Progressing ?Goal: Respiratory complications will improve ?Outcome: Not Progressing ?Goal: Cardiovascular complication will be avoided ?Outcome: Not Progressing ?  ?Problem: Activity: ?Goal: Risk for activity intolerance will decrease ?Outcome: Not Progressing ?  ?Problem: Nutrition: ?Goal: Adequate nutrition will be maintained ?Outcome: Not Progressing ?  ?Problem: Coping: ?Goal: Level of anxiety will decrease ?Outcome: Not Progressing ?  ?Problem: Elimination: ?Goal: Will not experience complications related to bowel motility ?Outcome: Not Progressing ?Goal: Will not experience complications related to urinary retention ?Outcome: Not Progressing ?  ?Problem: Pain Managment: ?Goal: General experience of comfort will improve ?Outcome: Not Progressing ?  ?Problem: Safety: ?Goal: Ability to remain free from injury will improve ?Outcome: Not Progressing ?  ?Problem: Skin Integrity: ?Goal: Risk for impaired skin integrity will decrease ?Outcome: Not Progressing ?  ?Problem: Education: ?Goal: Knowledge of secondary prevention will improve (SELECT ALL) ?Outcome: Not Progressing ?Patient is total care with left sided weakness can make most needs known on hep gtt for DVTs, failed swallow TF started per orders and protocols ?  ?

## 2021-05-15 NOTE — TOC Progression Note (Signed)
Transition of Care (TOC) - Progression Note  ? ? ?Patient Details  ?Name: [place]. ?MRN: [id number] ?Date of Birth: [birth year] ? ?Transition of Care (TOC) CM/SW Contact  ?Shelbie Hutching, RN ?Phone Number: ?05/15/2021, 3:03 PM ? ?Clinical Narrative:    ?Carolin Sicks, daughter in law, and son, Dominic at the bedside today.  RNCM reached back out to [place], she has been trying to reach [employer]ail without success so they can get an application for Medicaid started.  Carolin Sicks said that she started one online but there is nothing pending in the system, it could be that it was denied before.   ?Amy will be coming over to the hospital tomorrow to see patient.   ? ? ?Expected Discharge Plan: [place] ?Barriers to Discharge: Continued Medical Work up ? ?Expected Discharge Plan and Services ?Expected Discharge Plan: [place] ?  ?Discharge Planning Services: CM Consult ?Post Acute Care Choice: [place] ?Living arrangements for the past 2 months: Mobile Home ?                ?  ?  ?  ?  ?  ?  ?  ?  ?  ?  ? ? ?Social Determinants of Health (SDOH) Interventions ?  ? ?Readmission Risk Interventions ?   ? View : No data to display.  ?  ?  ?  ? ? ?

## 2021-05-15 NOTE — Progress Notes (Addendum)
PHARMACY CONSULT NOTE ? ?Pharmacy Consult for Electrolyte Monitoring and Replacement  ? ?Recent Labs: ?Potassium (mmol/L)  ?Date Value  ?05/15/2021 3.1 (L)  ? ?Magnesium (mg/dL)  ?Date Value  ?05/15/2021 2.6 (H)  ? ?Calcium (mg/dL)  ?Date Value  ?05/15/2021 8.4 (L)  ? ?Albumin (g/dL)  ?Date Value  ?05/13/2021 3.5  ?03/03/2021 3.0 (L)  ? ?Phosphorus (mg/dL)  ?Date Value  ?05/15/2021 3.5  ? ?Sodium (mmol/L)  ?Date Value  ?05/15/2021 138  ?03/03/2021 145 (H)  ? ?Assessment: ?65YOM initially presented after fall at home with traumatic SAH and SDH in the setting of alcohol intoxication. PMH paroxysmal Afib, HTN, dCHF. Pharmacy has been consulted to dose electrolytes. ? ?Intubated 4/4 - 4/5 ? ?Goal of Therapy:  ?Electrolytes within normal limits ? ?Plan:  ?--K 3.1, hopefully patient will be able to tolerate enteral replacement. Will order Kcl 40 mEq PO BID x 2 doses ?--Will follow-up with team to see if patient can take PO ?--Follow-up electrolytes with AM labs tomorrow ? ?Update: Given issues with aspiration this admission. Informed by RN that patient will need formal SLP evaluation. Will discontinue oral replacement and will order IV Kcl 10 mEq x 4 runs ? ?Tressie Ellis ?05/15/2021 ?8:12 AM ? ?

## 2021-05-15 NOTE — Progress Notes (Signed)
Nutrition Follow-up ? ?DOCUMENTATION CODES:  ? ?Underweight, Severe malnutrition in context of chronic illness ? ?INTERVENTION:  ? ?Change to Osmolite 1.5@55ml/hr + ProSource TF 45ml BID via tube  ? ?Free water flushes 30ml q4 hours to maintain tube patency  ? ?Regimen provides 2060kcal/day, 105g/day protein and 1186ml/day of free water.  ? ?NUTRITION DIAGNOSIS:  ? ?Severe Malnutrition related to chronic illness (ETOH abuse) as evidenced by moderate fat depletion, severe fat depletion, moderate muscle depletion, severe muscle depletion. ?-ongoing  ? ?GOAL:  ? ?Patient will meet greater than or equal to 90% of their needs ?-met  ? ?MONITOR:  ? ?Diet advancement, Labs, Weight trends, TF tolerance, Skin, I & O's ? ?ASSESSMENT:  ? ?65 y/o male with h/o HTN, CHF, etoh abuse, cardiomyopathy, HLD and PAF who is admitted with SAH after fall. ? ?Pt extubated 4/5. Pt seen by SLP and unable to be placed on an oral diet. Plan today is for NGT and nutrition support. Per chart, pt has remained fairly weight stable since admission.  ? ?Medications reviewed and include: colace, folic acid, insulin, MVI, nicotine, protonix, miralax, thiamine, heparin, zosyn, KCl  ? ?Labs reviewed: K 3.1(L), BUN 35(H), P 3.5 wnl, Mg 2.6(H) ?Wbc- 22.1(H), Hgb 8.6(L), Hct 26.7(L) ?Cbgs- 112, 129, 117, 113 x 24 hrs ? ?Diet Order:   ?Diet Order   ? ?       ?  Diet NPO time specified Except for: Other (See Comments)  Diet effective now       ?  ? ?  ?  ? ?  ? ?EDUCATION NEEDS:  ? ?Education needs have been addressed ? ?Skin:  Skin Assessment: Skin Integrity Issues: ?Skin Integrity Issues:: Other (Comment) ?Other: healed pressure injury to sacrum ? ?Last BM:  4/6- type 7 ? ?Height:  ? ?Ht Readings from Last 1 Encounters:  ?05/03/21 6' 5" (1.956 m)  ? ? ?Weight:  ? ?Wt Readings from Last 1 Encounters:  ?05/15/21 32.3 kg  ? ? ?Ideal Body Weight:  94.5 kg ? ?BMI:  Body mass index is 8.44 kg/m?. ? ?Estimated Nutritional Needs:  ? ?Kcal:   1900-2200kcal/day ? ?Protein:  95-105g/day ? ?Fluid:  1.8-2.1L/day ? ?  MS, RD, LDN ?Please refer to [facility] for RD and/or RD on-call/weekend/after hours pager ? ?

## 2021-05-15 NOTE — Progress Notes (Addendum)
Patient has pulled out his NG tube. NP Manuela Schwartz notified. Will continue to monitor ?

## 2021-05-15 NOTE — Progress Notes (Signed)
Mitts placed on patient. Patient is confused. Telemonitor order placed ?

## 2021-05-15 NOTE — Progress Notes (Signed)
ANTICOAGULATION CONSULT NOTE  ? ?Pharmacy Consult for IV heparin ?Indication: DVT ? ?No Known Allergies ? ?Patient Measurements: ?Height: 6\' 5"  (195.6 cm) ?Weight: 32.3 kg (71 lb 3.3 oz) (extra blankets, kangaroo pump and extra pillows removed) ?IBW/kg (Calculated) : 89.1 ?Heparin Dosing Weight: 59.3 kg ? ?Vital Signs: ?Temp: 98.7 ?F (37.1 ?C) (04/06 0400) ?Temp Source: Oral (04/06 0400) ?BP: 118/88 (04/06 0600) ?Pulse Rate: 86 (04/06 0414) ? ?Labs: ?Recent Labs  ?  05/13/21 ?0106 05/13/21 ?[id number] 05/13/21 ?[id number] 05/13/21 ?1242 05/13/21 ?1803 05/14/21 ?0403 05/15/21 ?0348  ?HGB 12.1*  --   --   --   --  9.1* 8.6*  ?HCT 37.5*  --   --   --   --  26.9* 26.7*  ?PLT 326  --   --   --   --  261 252  ?APTT  --  30  --   --   --   --   --   ?LABPROT  --  14.8  --   --   --   --   --   ?INR  --  1.2  --   --   --   --   --   ?HEPARINUNFRC  --   --   --   --  0.35 0.33 0.11*  ?CREATININE  --  1.56*  --   --   --  1.35* 1.12  ?CKTOTAL  --   --   --  157  --   --   --   ?TROPONINIHS  --  12 13  --   --   --   --   ? ? ? ?Estimated Creatinine Clearance: 30.8 mL/min (by C-G formula based on SCr of 1.12 mg/dL). ? ? ?Medical History: ?Past Medical History:  ?Diagnosis Date  ? Alcohol abuse   ? a. 1/5 of vodka daily.  ? Cardiomyopathy (HCC)   ? a. 01/2021 Echo: EF 30-35%, glob HK, apical AK. Mildly reduced RV fxn.  ? HFrEF (heart failure with reduced ejection fraction) (HCC)   ? Hyperlipidemia   ? Hypertension   ? PAF (paroxysmal atrial fibrillation) (HCC)   ? Tobacco abuse   ? ? ?Medications:  ?Medications Prior to Admission  ?Medication Sig Dispense Refill Last Dose  ? folic acid (FOLVITE) 1 MG tablet Take 1 tablet (1 mg total) by mouth once daily. 30 tablet 1 Past Week  ? ivabradine (CORLANOR) 5 MG TABS tablet Take 2 tablets (10mg ) TWO hours prior to your cardiac CT in addition to the metoprolol tartrate. (Patient not taking: Reported on 05/03/2021) 2 tablet 0 Not Taking  ? metoprolol succinate (TOPROL-XL) 25 MG 24 hr tablet Take  (1/2) tablet (12.5 mg total) by mouth once daily. 45 tablet 3 Past Week  ? thiamine (VITAMIN B-1) 100 MG tablet Take 1 tablet (100 mg total) by mouth once daily. 30 tablet 2 Past Week  ? metoprolol tartrate (LOPRESSOR) 100 MG tablet Take 1 tablet (100 mg total) by mouth once for 1 dose. (Take 2 hours prior to your test). 1 tablet 0   ? spironolactone (ALDACTONE) 25 MG tablet Take 0.5 tablets (12.5 mg total) by mouth once daily. (Patient not taking: Reported on 05/03/2021) 45 tablet 3 Not Taking  ? ?Scheduled:  ? Chlorhexidine Gluconate Cloth  6 each Topical Daily  ? docusate  100 mg Per Tube BID  ? feeding supplement (PROSource TF)  45 mL Per Tube Daily  ? fentaNYL (SUBLIMAZE) injection  50 mcg Intravenous Once  ?  folic acid  1 mg Per Tube Daily  ? hydrocortisone sod succinate (SOLU-CORTEF) inj  100 mg Intravenous Q12H  ? insulin aspart  0-15 Units Subcutaneous Q4H  ? lidocaine  1 patch Transdermal Q24H  ? multivitamin with minerals  1 tablet Per Tube Daily  ? nicotine  14 mg Transdermal Daily  ? pantoprazole sodium  40 mg Per Tube Daily  ? polyethylene glycol  17 g Per Tube Daily  ? thiamine injection  100 mg Intravenous Daily  ? Or  ? thiamine  100 mg Per Tube Daily  ? ?Infusions:  ? sodium chloride Stopped (05/09/21 1411)  ? dexmedetomidine (PRECEDEX) IV infusion Stopped (05/13/21 1023)  ? feeding supplement (VITAL 1.5 CAL) Stopped (05/14/21 1412)  ? heparin 1,000 Units/hr (05/15/21 0600)  ? norepinephrine (LEVOPHED) Adult infusion Stopped (05/14/21 1602)  ? piperacillin-tazobactam (ZOSYN)  IV 12.5 mL/hr at 05/15/21 0600  ? vasopressin Stopped (05/14/21 1817)  ? ?PRN: sodium chloride, acetaminophen, fentaNYL (SUBLIMAZE) injection, fentaNYL (SUBLIMAZE) injection, midazolam, midazolam, ondansetron (ZOFRAN) IV ?Anti-infectives (From admission, onward)  ? ? Start     Dose/Rate Route Frequency Ordered Stop  ? 05/13/21 2100  ceFEPIme (MAXIPIME) 2 g in sodium chloride 0.9 % 100 mL IVPB  Status:  Discontinued       ? 2  g ?200 mL/hr over 30 Minutes Intravenous Every 12 hours 05/13/21 0210 05/13/21 0326  ? 05/13/21 2100  ceFEPIme (MAXIPIME) 2 g in sodium chloride 0.9 % 100 mL IVPB  Status:  Discontinued       ? 2 g ?200 mL/hr over 30 Minutes Intravenous Every 12 hours 05/13/21 0326 05/13/21 0845  ? 05/13/21 1500  piperacillin-tazobactam (ZOSYN) IVPB 3.375 g       ? 3.375 g ?12.5 mL/hr over 240 Minutes Intravenous Every 8 hours 05/13/21 0853    ? 05/13/21 0400  ceFEPIme (MAXIPIME) 2 g in sodium chloride 0.9 % 100 mL IVPB  Status:  Discontinued       ? 2 g ?200 mL/hr over 30 Minutes Intravenous Every 12 hours 05/13/21 0106 05/13/21 0210  ? 05/13/21 0300  vancomycin (VANCOCIN) IVPB 1000 mg/200 mL premix  Status:  Discontinued       ? 1,000 mg ?200 mL/hr over 60 Minutes Intravenous  Once 05/13/21 0205 05/13/21 0213  ? 05/13/21 0230  ceFEPIme (MAXIPIME) 2 g in sodium chloride 0.9 % 100 mL IVPB       ? 2 g ?200 mL/hr over 30 Minutes Intravenous  Once 05/13/21 0205 05/13/21 0349  ? 05/13/21 0200  vancomycin (VANCOREADY) IVPB 1500 mg/300 mL  Status:  Discontinued       ? 1,500 mg ?150 mL/hr over 120 Minutes Intravenous  Once 05/13/21 0106 05/13/21 0110  ? 05/13/21 0200  vancomycin (VANCOCIN) IVPB 1000 mg/200 mL premix  Status:  Discontinued       ? 1,000 mg ?200 mL/hr over 60 Minutes Intravenous Every 24 hours 05/13/21 0110 05/13/21 0845  ? 05/13/21 0045  metroNIDAZOLE (FLAGYL) IVPB 500 mg  Status:  Discontinued       ? 500 mg ?100 mL/hr over 60 Minutes Intravenous Every 12 hours 05/12/21 2357 05/13/21 0205  ? ?  ? ?No PTA meds per med rec and my chart review ? ?Assessment: ?65YOM initially presented after fall at home with traumatic SAH and SDH in the setting of alcohol intoxication. PMH paroxysmal Afib, HTN, dCHF. Overnight on 4/4, was readmitted to the ICU due to change in mental status and noted to have left leg  swollen. Heparin started and then d/c'd early AM 4/4. Then, venous ultrasound confirmed DVT. Pharmacy has been consulted to  restart IV heparin. ? ?BL labs: Hgb 12.1, Plts 326,  ? ?Goal of Therapy:  ?Heparin level 0.3-0.7 units/ml ?Monitor platelets by anticoagulation protocol: Yes ? ?4/04 1803 HL 0.35, therapeutic x 1 ?4/05 0403 HL 0.33, therapeutic x 2 ?4/06 0348 HL 0.11, subtherapeutic ?  ?Plan:  ?No bolus due to recent acute traumatic subdural or subarachnoid hemorrhage (though noted to be stable on multiple images) ?Increase heparin infusion to 1200 units/hr ?Recheck HL in 6 hrs after rate change ?Daily CBC ? ?Otelia Sergeant, PharmD, MBA ?05/15/2021 ?6:47 AM ? ? ? ? ?

## 2021-05-15 NOTE — TOC Progression Note (Addendum)
Transition of Care (TOC) - Progression Note  ? ? ?Patient Details  ?Name: Charles Schmitt. ?MRN: [id number] ?Date of Birth: 12/21/1955 ? ?Transition of Care (TOC) CM/SW Contact  ?Allayne Butcher, RN ?Phone Number: ?05/15/2021, 11:11 AM ? ?Clinical Narrative:    ?Patient was extubated yesterday and is on room air today.  Palliative care is following.  tOC will cont to follow and assist with discharge.  There have been no bed offers from SNF.   ? ? ?Expected Discharge Plan: Skilled Nursing Facility ?Barriers to Discharge: Continued Medical Work up ? ?Expected Discharge Plan and Services ?Expected Discharge Plan: Skilled Nursing Facility ?  ?Discharge Planning Services: CM Consult ?Post Acute Care Choice: Skilled Nursing Facility ?Living arrangements for the past 2 months: Mobile Home ?                ?  ?  ?  ?  ?  ?  ?  ?  ?  ?  ? ? ?Social Determinants of Health (SDOH) Interventions ?  ? ?Readmission Risk Interventions ?   ? View : No data to display.  ?  ?  ?  ? ? ?

## 2021-05-15 NOTE — Evaluation (Signed)
Clinical/Bedside Swallow Evaluation ?Patient Details  ?Name: Charles Schmitt. ?MRN: [id number] ?Date of Birth: 26-May-1955 ? ?Today's Date: 05/15/2021 ?Time: SLP Start Time (ACUTE ONLY): 2500 SLP Stop Time (ACUTE ONLY): 1000 ?SLP Time Calculation (min) (ACUTE ONLY): 15 min ? ?Past Medical History:  ?Past Medical History:  ?Diagnosis Date  ? Alcohol abuse   ? a. 1/5 of vodka daily.  ? Cardiomyopathy ([place])   ? a. 01/2021 Echo: EF 30-35%, glob HK, apical AK. Mildly reduced RV fxn.  ? HFrEF (heart failure with reduced ejection fraction) ([place])   ? Hyperlipidemia   ? Hypertension   ? PAF (paroxysmal atrial fibrillation) ([place])   ? Tobacco abuse   ? ?Past Surgical History:  ?Past Surgical History:  ?Procedure Laterality Date  ? NO PAST SURGERIES    ? ?HPI:  ?Per PCCM note, 05/15/21, "This is a 66 y/o male who was admitted on 3/25 after a fall at home with a traumatic SAH and SDH in the setting of alcohol intoxicatino. NSGY was consulted and recommended conservative management. Since admission he has been on the hospitalist service and PT, OT and SLP have been following. He has noted to be weak and unable to perform his ADL's.  He was treated for Alcohol withdrawal with thiamine, folate, vitamins and lithium.  Palliative medicine saw him to discuss goals fo care on 3/28 during which time he remained full code and was focused on being able to leave the hospital.  The patient has been waiting for SNF during his hospitalization.  Paroxysmal atrial fibrillation, hypertension and diastolic heart failure have been managed by the primary service.  On 4/3 he had a change in mental status to becoming minimally responsive.  A head CT was repeated and showed no significant change in his small right subdural hematoma and 18m right to left midline shift.       On 4/4 the patient became minimally responsive, tachycardic, tachypneic, and had soft blood pressure with associated respiratory alkalosis and lactic acidosis.  PCCM was consulted for  further evaluation." CT angio chest 05/13/21 "1. Remote bilateral pulmonary emboli with webs. No acute appearing emboli. 2. Left more than right pneumonia with dependent pattern and airway debris suggesting underlying aspiration. Mild cylindrical bronchiectasis in the right lower lobe. 3. 4 cm diameter ascending aorta. Recommend annual imaging followup by CTA or MRA. This recommendation follows 2010 ?ACCF/AHA/AATS/ACR/ASA/SCA/SCAI/SIR/STS/SVM Guidelines for the Diagnosis and Management of Patients with Thoracic Aortic Disease.Circulation. 2010; 121:: B704-U889 Aortic aneurysm NOS (ICD10-I71.9)" ?  ?Assessment / Plan / Recommendation  ?Clinical Impression ? Pt alert, confusion and poor sustained attention evident. Waxing/waning LOA appreciated. Required frequent redirection. Dysphonia noted included hypophonic and intermittently wet vocal quality. Sister at bedside. Cleared with RN. ? ?Oral motor examination attempted; however, pt with inconsistent ability to follow commands. Pt with concern for reduced secretion management given retained oral secretions and drooling prior to POs as well as intermittent wet vocal quality prior to POs. Pt unable to complete a cued swallow to clear. Ultimately, oral secretions were suctioned from oral cavity via [place].  ? ?Oral care provided with items from oral care kit prior to POs trials.  ? ?Pt given x2 trials of ice chips. Pt with s/sx at least a moderate oral dysphagia c/b oral holding, prolonged mastication, and prolonged A-P transit. Suspect oral deficits exacerbated by pt's mental status. ?some degree of lingual weakness/incoordination. Concern for highly suspected pharyngeal dysphagia given seemingly delayed swallow initiation to palpation, seemingly reduced laryngeal elevation to palpation, and wet  vocal quality following POs.  ? ?Pt is at increased risk for aspiration/aspiration PNA given overall clinical presentation, concern for reduced secretion management, multiple  medical comorbidities, cognitive status/AMS/LOA, dental status, and need for assistance with feeding at present.  ? ?At present a safe oral diet cannot be recommended. Recommend NPO with consideration for short term alternate route of nutrition/hydration/medication.  ? ?Pt, pt's sister, and RN made aware of results, recommendations, and SLP POC. Likely limited understanding by pt given confusion. Sister verbalized understanding.  ? ?SLP to f/u per POC for clinical swallowing evaluation with consideration for instrumental swallowing evaluation given MD concern for aspiration as a precipitating event prior to intubation and pt's overall clinical presentation.  ? ?SLP Visit Diagnosis: Dysphagia, oropharyngeal phase (R13.12) ?   ?Aspiration Risk ? Severe aspiration risk  ?  ?Diet Recommendation Alternative means - temporary  ? ?Medication Administration: Via alternative means  ?  ?Other  Recommendations Recommended Consults:  (Palliative; Registered Dietician) ?Oral Care Recommendations: Staff/trained caregiver to provide oral care;Oral care QID ?Other Recommendations: Have oral suction available   ? ?Recommendations for follow up therapy are one component of a multi-disciplinary discharge planning process, led by the attending physician.  Recommendations may be updated based on patient status, additional functional criteria and insurance authorization. ? ?Follow up Recommendations Skilled nursing-short term rehab (<3 hours/day)  ? ? ?  ?Assistance Recommended at Discharge Frequent or constant Supervision/Assistance  ?Functional Status Assessment Patient has had a recent decline in their functional status and/or demonstrates limited ability to make significant improvements in function in a reasonable and predictable amount of time  ?Frequency and Duration min 2x/week  ?2 weeks ?  ?   ? ?Prognosis Prognosis for Safe Diet Advancement: Fair ?Barriers to Reach Goals: Cognitive deficits;Severity of deficits;Behavior  ? ?   ? ?Swallow Study   ?General Date of Onset: 05/03/21 ?HPI: Per PCCM note, 05/15/21, "This is a 66 y/o male who was admitted on 3/25 after a fall at home with a traumatic SAH and SDH in the setting of alcohol intoxicatino. NSGY was consulted and recommended conservative management. Since admission he has been on the hospitalist service and PT, OT and SLP have been following. He has noted to be weak and unable to perform his ADL's.  He was treated for Alcohol withdrawal with thiamine, folate, vitamins and lithium.  Palliative medicine saw him to discuss goals fo care on 3/28 during which time he remained full code and was focused on being able to leave the hospital.  The patient has been waiting for SNF during his hospitalization.  Paroxysmal atrial fibrillation, hypertension and diastolic heart failure have been managed by the primary service.  On 4/3 he had a change in mental status to becoming minimally responsive.  A head CT was repeated and showed no significant change in his small right subdural hematoma and 54m right to left midline shift.       On 4/4 the patient became minimally responsive, tachycardic, tachypneic, and had soft blood pressure with associated respiratory alkalosis and lactic acidosis.  PCCM was consulted for further evaluation." ?Type of Study: Bedside Swallow Evaluation ?Previous Swallow Assessment: seen earlier this admission with recommendation for mech soft diet with thin liquids ?Diet Prior to this Study: NPO ?Temperature Spikes Noted: Yes (low grade temp last 24 hours) ?Respiratory Status: Room air ?History of Recent Intubation: Yes ?Length of Intubations (days): 2 days ?Date extubated: 05/14/21 ?Behavior/Cognition: Alert;Confused;Lethargic/Drowsy;Distractible;Requires cueing (waxing/waning LOA) ?Oral Cavity Assessment: Within Functional Limits ?Oral Care Completed  by SLP: Yes ?Oral Cavity - Dentition: Missing dentition ?Vision: Functional for self-feeding (unable to feed self due to BUE  weakness) ?Baseline Vocal Quality: Wet;Low vocal intensity ?Volitional Cough: Cognitively unable to elicit ?Volitional Swallow: Unable to elicit  ?  ?Oral/Motor/Sensory Function Overall Oral Motor/Sensory Fun

## 2021-05-15 NOTE — Telephone Encounter (Signed)
Await paperwork for review.  ?

## 2021-05-15 NOTE — Progress Notes (Signed)
ANTICOAGULATION CONSULT NOTE  ? ?Pharmacy Consult for IV Heparin ?Indication: DVT ? ?Patient Measurements: ?Height: 6\' 5"  (195.6 cm) ?Weight: 32.3 kg (71 lb 3.3 oz) (extra blankets, kangaroo pump and extra pillows removed) ?IBW/kg (Calculated) : 89.1 ?Heparin Dosing Weight: 59.3 kg ? ?Labs: ?Recent Labs  ?  05/13/21 ?0106 05/13/21 ?07/13/21 05/13/21 ?07/13/21 05/13/21 ?1242 05/13/21 ?1803 05/14/21 ?0403 05/15/21 ?07/15/21 05/15/21 ?1327  ?HGB 12.1*  --   --   --   --  9.1* 8.6*  --   ?HCT 37.5*  --   --   --   --  26.9* 26.7*  --   ?PLT 326  --   --   --   --  261 252  --   ?APTT  --  30  --   --   --   --   --   --   ?LABPROT  --  14.8  --   --   --   --   --   --   ?INR  --  1.2  --   --   --   --   --   --   ?HEPARINUNFRC  --   --   --   --    < > 0.33 0.11* 0.22*  ?CREATININE  --  1.56*  --   --   --  1.35* 1.12  --   ?CKTOTAL  --   --   --  157  --   --   --   --   ?TROPONINIHS  --  12 13  --   --   --   --   --   ? < > = values in this interval not displayed.  ? ? ? ?Estimated Creatinine Clearance: 30.8 mL/min (by C-G formula based on SCr of 1.12 mg/dL). ? ? ?Medical History: ?Past Medical History:  ?Diagnosis Date  ? Alcohol abuse   ? a. 1/5 of vodka daily.  ? Cardiomyopathy ([facility])   ? a. 01/2021 Echo: EF 30-35%, glob HK, apical AK. Mildly reduced RV fxn.  ? HFrEF (heart failure with reduced ejection fraction) ([facility])   ? Hyperlipidemia   ? Hypertension   ? PAF (paroxysmal atrial fibrillation) ([facility])   ? Tobacco abuse   ? ?No PTA meds per med rec and my chart review ? ?Assessment: ?65YOM initially presented after fall at home with traumatic SAH and SDH in the setting of alcohol intoxication. PMH paroxysmal Afib, HTN, dCHF. Overnight on 4/4, was readmitted to the ICU due to change in mental status and noted to have left leg swollen. Heparin started and then d/c'd early AM 4/4. Then, venous ultrasound confirmed DVT. Pharmacy has been consulted to restart IV heparin. ? ?Goal of Therapy:  ?Heparin level 0.3-0.7 units/ml ?Monitor  platelets by anticoagulation protocol: Yes ? ?No bolus due to recent acute traumatic subdural or subarachnoid hemorrhage (though noted to be stable on multiple images) ?  ?Plan:  ?Heparin level is subtherapeutic ?Increase heparin infusion to 1300 units/hr ?Recheck HL in 6 hrs after rate change ?Daily CBC per protocol ? ?6/4  ?05/15/2021 ?1:58 PM ? ? ? ? ?

## 2021-05-15 NOTE — Progress Notes (Signed)
1500 called report to transfer patient to PCU ?1508 spoke with Luna Kitchens in patient chart to update on room change to 238 ?Bladder canned >324 straight cath  ?1530 central line removed ?

## 2021-05-15 NOTE — Progress Notes (Addendum)
? ?NAME:  Charles Schmitt., MRN:  [id number], DOB:  1955-03-06, LOS: 12 ?ADMISSION DATE:  05/03/2021, CONSULTATION DATE:  4/4 ?REFERRING MD:  Myriam Forehand, CHIEF COMPLAINT:  Change in mental status  ? ?History of Present Illness:  ?This is a 66 y/o male who was admitted on 3/25 after a fall at home with a traumatic SAH and SDH in the setting of alcohol intoxicatino. NSGY was consulted and recommended conservative management. Since admission he has been on the hospitalist service and PT, OT and SLP have been following. He has noted to be weak and unable to perform his ADL's.  He was treated for Alcohol withdrawal with thiamine, folate, vitamins and lithium.  Palliative medicine saw him to discuss goals fo care on 3/28 during which time he remained full code and was focused on being able to leave the hospital.  The patient has been waiting for SNF during his hospitalization.  Paroxysmal atrial fibrillation, hypertension and diastolic heart failure have been managed by the primary service. ?On 4/3 he had a change in mental status to becoming minimally responsive.  A head CT was repeated and showed no significant change in his small right subdural hematoma and 31mm right to left midline shift.   ? ?On 4/4 the patient became minimally responsive, tachycardic, tachypneic, and had soft blood pressure with associated respiratory alkalosis and lactic acidosis.  PCCM was consulted for further evaluation.   ? ?Pertinent  Medical History  ?Alcohol dependence ? ? ?Significant Hospital Events: ?Including procedures, antibiotic start and stop dates in addition to other pertinent events   ?3/25 admission for multiple falls, traumatic SAH and SDH ?3/28 palliative care consultation: full code ?4/4 moved to the ICU for change in mental status and sepsis, left leg is swollen, mottled.  ? ? ?Procedure: ?4/4 ETT > ?4/4 L IJ CVL >  ? ?Micro: ?4/4 blood >  ? ?Abx ?4/4 vanc >  ?4/4 cefepime >  ? ? ?Imaging: ?3/25 lumbar ct> no fracture or  subluxation, hepatic steatosis ?3/25 CT head > Acute frontoparietal R subdural hematoma with 76mm leftward midline shift, three small intraparenchymal hemorrhagic contusion in R frontal lobe with edema, no mass effect, R SAH in sylvian fissure unchanged ?3/28 CT head > no significant change ?4/3 CT head > grossy stable size and appearance of R sided subdural with 8mm R to left midline shift (unchanged compared to prior), SAH on right has nearly resolved, contusions improved ?4/4 CT head > no significant change compared to 4/3, midline shift 28mm ? ? ?Interim History / Subjective:  ?Patient was successfully extubated yesterday ?Remain generalized weak ?Off vasopressors and remained afebrile ? ?Objective   ?Blood pressure 125/90, pulse 86, temperature 97.7 ?F (36.5 ?C), temperature source Axillary, resp. rate 17, height 6\' 5"  (1.956 m), weight 32.3 kg, SpO2 99 %. ?CVP:  [0 mmHg-6 mmHg] 4 mmHg  ?Vent Mode: PSV ?FiO2 (%):  [40 %] 40 % ?PEEP:  [5 cmH20] 5 cmH20 ?Pressure Support:  [5 cmH20] 5 cmH20  ? ?Intake/Output Summary (Last 24 hours) at 05/15/2021 0852 ?Last data filed at 05/15/2021 0800 ?Gross per 24 hour  ?Intake 773.36 ml  ?Output 850 ml  ?Net -76.64 ml  ? ?Filed Weights  ? 05/12/21 0500 05/14/21 0500 05/15/21 0500  ?Weight: 59.3 kg 58.6 kg 32.3 kg  ? ? ?Examination: ?Physical exam: ?General: Acute on chronically ill-appearing male, lying on the bed ?HEENT: [place]/AT, eyes anicteric.  moist mucus membranes ?Neuro: Alert, awake following commands, following commands weak left lower extremity ?  Chest: Coarse breath sounds, no wheezes or rhonchi ?Heart: Regular rate and rhythm, no murmurs or gallops ?Abdomen: Soft, nontender, nondistended, bowel sounds present ?Skin: No rash ? ?DVT US +for large, proximal LLE DVT ? ?Resolved Hospital Problem list   ?Septic shock ? ?Assessment & Plan:  ?HAP/aspiration pneumonia ?Acute hypoxic respiratory failure ?Patient was successfully extubated yesterday, remains on 2 to 3 L oxygen via  nasal cannula ?He is off vasopressors, septic shock has resolved ?Continue IV antibiotics with Zosyn to complete 7 days therapy ?Cultures remain negative ?Stress dose steroid was stopped ?Speech and swallow evaluation is pending ? ?Acute left lower extremity DVT involving common femoral, femoral, popliteal and tibial veins ?Continue IV heparin infusion for now ?Would consider referral for IVC filter pending recovery ? ?Chronic HFpEF with cor pulmonale ?Patient remained euvolemic ?Monitor intake and output ?Echocardiogram showed EF of 55% with diastolic dysfunction and right ventricular hypokinesis ? ?Acute septic encephalopathy  ?Alcohol use disorder ?Patient mental status has improved ?Avoid sedation ?Continue thiamine, folate and multivitamin  ?EEG without seizure ? ?AKI due to sepsis related ATN ?Hypokalemia ?Serum creatinine is improving ?Avoid nephrotoxic agents ?Continue aggressive electrolyte supplement and monitor ? ?Traumatic subdural or subarachnoid hemorrhage > stable on multiple images ?Continue supportive care ?PT/OT evaluation ? ?Severe physical deconditioning at baseline ?Moderate-severe protein calorie malnourishment ?Speech and swallow evaluation is pending ?He will need dietary supplements ?PT and OT as to see him ? ?Hyponatremia/hyperkalemia ?Resolved ? ?Best Practice (right click and "Reselect all SmartList Selections" daily)  ? ?Diet/type: NPO for now until SLP is done ?DVT prophylaxis: IV heparin ?GI prophylaxis: PPI ?Lines: Discontinue ?Foley:  Discontinue ?Code Status: Limited code ?Last date of multidisciplinary goals of care discussion: 79/5 [Partial code - No CPR/shocks/ACLS.  ? ?Critical care time:  ?  ? ?  ?Cheri Fowler MD ?[facility] ?See Amion for pager ?If no response to pager, please call [phone] until 7pm ?After 7pm, Please call E-link [phone] ? ? ? ? ?  ?

## 2021-05-15 NOTE — Progress Notes (Signed)
Patient's heparin increased from 13.5 to 14. Rate administered ?

## 2021-05-15 NOTE — Progress Notes (Signed)
Progress note: ? ?After reviewing the patient's chart, assessed the patient at bedside.  He continued to ask for his shoes and wanted to know when he can get out of the bed.  Sister was at bedside.  He is extubated but not able to participate in meaningful discussions. ? ?No changes in goals. ? ?Limited code and full scope remain. ? ?PMT will shadow the patient's chart and intervene at patient/family/medical team's request, if goals change, or if patient's health status declines. ? ?Charles Schmitt. Jahzier Villalon, DNP, FNP-BC ?Palliative Medicine Team ?Team Phone # [phone] ? ?NO CHARGE ?

## 2021-05-15 NOTE — Progress Notes (Signed)
Blood Sugar at 2100:  110. No insulin administered ?

## 2021-05-15 NOTE — Progress Notes (Signed)
*  PRELIMINARY RESULTS* ?Echocardiogram ?2D Echocardiogram has been performed. ? ?Charles Schmitt ?05/15/2021, 9:05 AM ?

## 2021-05-15 NOTE — Progress Notes (Signed)
ANTICOAGULATION CONSULT NOTE  ? ?Pharmacy Consult for IV Heparin ?Indication: DVT ? ?Patient Measurements: ?Height: 6\' 5"  (195.6 cm) ?Weight: 32.3 kg (71 lb 3.3 oz) (extra blankets, kangaroo pump and extra pillows removed) ?IBW/kg (Calculated) : 89.1 ?Heparin Dosing Weight: 59.3 kg ? ?Labs: ?Recent Labs  ?  05/13/21 ?0106 05/13/21 ?[id number] 05/13/21 ?[id number] 05/13/21 ?1242 05/13/21 ?1803 05/14/21 ?0403 05/15/21 ?0348 05/15/21 ?1327 05/15/21 ?2247  ?HGB 12.1*  --   --   --   --  9.1* 8.6*  --   --   ?HCT 37.5*  --   --   --   --  26.9* 26.7*  --   --   ?PLT 326  --   --   --   --  261 252  --   --   ?APTT  --  30  --   --   --   --   --   --   --   ?LABPROT  --  14.8  --   --   --   --   --   --   --   ?INR  --  1.2  --   --   --   --   --   --   --   ?HEPARINUNFRC  --   --   --   --    < > 0.33 0.11* 0.22* 0.23*  ?CREATININE  --  1.56*  --   --   --  1.35* 1.12  --   --   ?CKTOTAL  --   --   --  157  --   --   --   --   --   ?TROPONINIHS  --  12 13  --   --   --   --   --   --   ? < > = values in this interval not displayed.  ? ? ? ?Estimated Creatinine Clearance: 30.8 mL/min (by C-G formula based on SCr of 1.12 mg/dL). ? ? ?Medical History: ?Past Medical History:  ?Diagnosis Date  ? Alcohol abuse   ? a. 1/5 of vodka daily.  ? Cardiomyopathy ([place])   ? a. 01/2021 Echo: EF 30-35%, glob HK, apical AK. Mildly reduced RV fxn.  ? HFrEF (heart failure with reduced ejection fraction) ([place])   ? Hyperlipidemia   ? Hypertension   ? PAF (paroxysmal atrial fibrillation) ([place])   ? Tobacco abuse   ? ?No PTA meds per med rec and my chart review ? ?Assessment: ?65YOM initially presented after fall at home with traumatic SAH and SDH in the setting of alcohol intoxication. PMH paroxysmal Afib, HTN, dCHF. Overnight on 4/4, was readmitted to the ICU due to change in mental status and noted to have left leg swollen. Heparin started and then d/c'd early AM 4/4. Then, venous ultrasound confirmed DVT. Pharmacy has been consulted to restart IV  heparin. ? ?Goal of Therapy:  ?Heparin level 0.3-0.7 units/ml ?Monitor platelets by anticoagulation protocol: Yes ? ?No bolus due to recent acute traumatic subdural or subarachnoid hemorrhage (though noted to be stable on multiple images) ?  ?Plan:  ?Heparin level is subtherapeutic ?Increase heparin infusion to 1400 units/hr ?Recheck HL w/ AM labs after rate change ?Daily CBC per protocol ? ?Renda Rolls, PharmD, MBA ?05/15/2021 ?11:22 PM ? ? ? ? ? ?

## 2021-05-16 ENCOUNTER — Inpatient Hospital Stay: Payer: Medicaid Other

## 2021-05-16 DIAGNOSIS — J9601 Acute respiratory failure with hypoxia: Secondary | ICD-10-CM

## 2021-05-16 DIAGNOSIS — I82462 Acute embolism and thrombosis of left calf muscular vein: Secondary | ICD-10-CM

## 2021-05-16 DIAGNOSIS — R338 Other retention of urine: Secondary | ICD-10-CM | POA: Insufficient documentation

## 2021-05-16 DIAGNOSIS — J690 Pneumonitis due to inhalation of food and vomit: Secondary | ICD-10-CM

## 2021-05-16 DIAGNOSIS — E512 Wernicke's encephalopathy: Secondary | ICD-10-CM

## 2021-05-16 DIAGNOSIS — N179 Acute kidney failure, unspecified: Secondary | ICD-10-CM

## 2021-05-16 DIAGNOSIS — I712 Thoracic aortic aneurysm, without rupture, unspecified: Secondary | ICD-10-CM

## 2021-05-16 DIAGNOSIS — F10931 Alcohol use, unspecified with withdrawal delirium: Secondary | ICD-10-CM

## 2021-05-16 DIAGNOSIS — E875 Hyperkalemia: Secondary | ICD-10-CM

## 2021-05-16 DIAGNOSIS — A419 Sepsis, unspecified organism: Secondary | ICD-10-CM

## 2021-05-16 DIAGNOSIS — I69191 Dysphagia following nontraumatic intracerebral hemorrhage: Secondary | ICD-10-CM

## 2021-05-16 LAB — URINALYSIS, ROUTINE W REFLEX MICROSCOPIC
Bacteria, UA: NONE SEEN
Bilirubin Urine: NEGATIVE
Glucose, UA: NEGATIVE mg/dL
Ketones, ur: NEGATIVE mg/dL
Leukocytes,Ua: NEGATIVE
Nitrite: NEGATIVE
Protein, ur: NEGATIVE mg/dL
Specific Gravity, Urine: 1.021 (ref 1.005–1.030)
pH: 5 (ref 5.0–8.0)

## 2021-05-16 LAB — BASIC METABOLIC PANEL
Anion gap: 11 (ref 5–15)
BUN: 28 mg/dL — ABNORMAL HIGH (ref 8–23)
CO2: 24 mmol/L (ref 22–32)
Calcium: 8.7 mg/dL — ABNORMAL LOW (ref 8.9–10.3)
Chloride: 108 mmol/L (ref 98–111)
Creatinine, Ser: 1.03 mg/dL (ref 0.61–1.24)
GFR, Estimated: >60 mL/min (ref >60)
Glucose, Bld: 87 mg/dL (ref 70–99)
Potassium: 3 mmol/L — ABNORMAL LOW (ref 3.5–5.1)
Sodium: 143 mmol/L (ref 135–145)

## 2021-05-16 LAB — GLUCOSE, CAPILLARY
Glucose-Capillary: 116 mg/dL — ABNORMAL HIGH (ref 70–99)
Glucose-Capillary: 70 mg/dL (ref 70–99)
Glucose-Capillary: 84 mg/dL (ref 70–99)
Glucose-Capillary: 84 mg/dL (ref 70–99)
Glucose-Capillary: 84 mg/dL (ref 70–99)
Glucose-Capillary: 92 mg/dL (ref 70–99)
Glucose-Capillary: 95 mg/dL (ref 70–99)

## 2021-05-16 LAB — HEPARIN LEVEL (UNFRACTIONATED)
Heparin Unfractionated: 0.35 IU/mL (ref 0.30–0.70)
Heparin Unfractionated: 0.36 IU/mL (ref 0.30–0.70)

## 2021-05-16 LAB — CBC
HCT: 28.5 % — ABNORMAL LOW (ref 39.0–52.0)
Hemoglobin: 9.4 g/dL — ABNORMAL LOW (ref 13.0–17.0)
MCH: 33 pg (ref 26.0–34.0)
MCHC: 33 g/dL (ref 30.0–36.0)
MCV: 100 fL (ref 80.0–100.0)
Platelets: 333 10*3/uL (ref 150–400)
RBC: 2.85 MIL/uL — ABNORMAL LOW (ref 4.22–5.81)
RDW: 14 % (ref 11.5–15.5)
WBC: 14.3 10*3/uL — ABNORMAL HIGH (ref 4.0–10.5)
nRBC: 0.3 % — ABNORMAL HIGH (ref 0.0–0.2)

## 2021-05-16 MED ORDER — DOCUSATE SODIUM 50 MG/5ML PO LIQD
100.0000 mg | Freq: Two times a day (BID) | ORAL | Status: DC
  Administered 2021-05-16: 100 mg
  Filled 2021-05-16 (×9): qty 10

## 2021-05-16 MED ORDER — POTASSIUM CHLORIDE 20 MEQ PO PACK
40.0000 meq | PACK | Freq: Once | ORAL | Status: AC
  Administered 2021-05-16: 40 meq
  Filled 2021-05-16: qty 2

## 2021-05-16 MED ORDER — ADULT MULTIVITAMIN W/MINERALS CH
1.0000 | ORAL_TABLET | Freq: Every day | ORAL | Status: DC
  Administered 2021-05-17 – 2021-05-20 (×4): 1
  Filled 2021-05-16 (×4): qty 1

## 2021-05-16 MED ORDER — FOLIC ACID 1 MG PO TABS
1.0000 mg | ORAL_TABLET | Freq: Every day | ORAL | Status: DC
  Administered 2021-05-17 – 2021-05-20 (×4): 1 mg
  Filled 2021-05-16 (×4): qty 1

## 2021-05-16 MED ORDER — IOHEXOL 300 MG/ML  SOLN
30.0000 mL | Freq: Once | INTRAMUSCULAR | Status: AC | PRN
  Administered 2021-05-16: 15 mL

## 2021-05-16 MED ORDER — POTASSIUM CHLORIDE 20 MEQ PO PACK: 40.0000 meq | PACK | Freq: Once | ORAL | Status: DC

## 2021-05-16 MED ORDER — SODIUM CHLORIDE 0.9 % IV SOLN
3.0000 g | Freq: Four times a day (QID) | INTRAVENOUS | Status: AC
  Administered 2021-05-16 – 2021-05-18 (×10): 3 g via INTRAVENOUS
  Filled 2021-05-16: qty 8
  Filled 2021-05-16: qty 3
  Filled 2021-05-16: qty 8
  Filled 2021-05-16 (×2): qty 3
  Filled 2021-05-16: qty 8
  Filled 2021-05-16 (×3): qty 3
  Filled 2021-05-16: qty 8

## 2021-05-16 MED ORDER — THIAMINE HCL 100 MG/ML IJ SOLN
500.0000 mg | Freq: Three times a day (TID) | INTRAVENOUS | Status: DC
  Administered 2021-05-16 – 2021-05-20 (×13): 500 mg via INTRAVENOUS
  Filled 2021-05-16 (×14): qty 5

## 2021-05-16 NOTE — Progress Notes (Signed)
eLink Physician-Brief Progress Note ?Patient Name: Charles Schmitt. ?DOB: [birth year] ?MRN: [id number] ? ? ?Date of Service ? 05/16/2021  ?HPI/Events of Note ? Dobhoff dislodged. Unable to reinsert overnight  ?eICU Interventions ? Hold TF ?Med list reviewed. No urgent meds needed at this time ?Day team to reassess for enteral access  ? ? ? ?Intervention Category ?Minor Interventions: Clinical assessment - ordering diagnostic tests ? ?Charles Schmitt ?05/16/2021, 12:52 AM ?

## 2021-05-16 NOTE — Assessment & Plan Note (Addendum)
Patient initial BMI was 8, increased to 12.16 after tube feeding, partially due to rehydration.  Palliative care is seeing patient, likely will transition to comfort care. ?

## 2021-05-16 NOTE — Assessment & Plan Note (Addendum)
No additional work-up is needed due to terminal care ?

## 2021-05-16 NOTE — Assessment & Plan Note (Signed)
Most likely ATN, renal function has improved ?

## 2021-05-16 NOTE — Hospital Course (Signed)
This is a 66 y/o male who was admitted on 3/25 after a fall at home with a traumatic SAH and SDH in the setting of alcohol intoxicatino. NSGY was consulted and recommended conservative management. ?On 4/3 he had a change in mental status to becoming minimally responsive.  A head CT was repeated and showed no significant change in his small right subdural hematoma and 70mm right to left midline shift.  CT scan on 4/4 showed a stable midline shift. ?Patient has significant dysphagia, had developed aspiration pneumonia with sepsis.  Treated with antibiotics. ? ?

## 2021-05-16 NOTE — Assessment & Plan Note (Addendum)
Pressure Injury 05/03/21 Sacrum healed pressure injury (Active)  ?05/03/21 2330  ?Location: Sacrum  ?Location Orientation:   ?Staging:   ?Wound Description (Comments): healed pressure injury  ?Present on Admission: Yes  ?   ?Pressure Injury 05/14/21 Buttocks Stage 1 -  Intact skin with non-blanchable redness of a localized area usually over a bony prominence. non blanchable redness/discoloration to buttocks and sacral area (Active)  ?05/14/21 2000  ?Location: Buttocks (and sacral area)  ?Location Orientation:   ?Staging: Stage 1 -  Intact skin with non-blanchable redness of a localized area usually over a bony prominence.  ?Wound Description (Comments): non blanchable redness/discoloration to buttocks and sacral area  ?Present on Admission:   ? ?Pressure ulcers of the sacrum and buttock as above. POA ? ?Followed by RN, try to improve nutrition status ?

## 2021-05-16 NOTE — Progress Notes (Signed)
Bladder scan performed. 336 ml of Urine in Bladder. ? ?In and Out performed. 330 ml drained into drainage bag ? ?Urine sample collected and sent to Lab for urinalysis ?

## 2021-05-16 NOTE — Assessment & Plan Note (Addendum)
Secondary to aspiration pneumonia.  Currently off oxygen with good saturation ?

## 2021-05-16 NOTE — Assessment & Plan Note (Addendum)
Potassium has normalized

## 2021-05-16 NOTE — Progress Notes (Signed)
Bladder scan was done which showed 466. Md had order foley cath for retention. Patient was able to void 400cc via external catheter. Per md okay to not insert foley cath at this time. Will continue to monitor ?

## 2021-05-16 NOTE — Progress Notes (Signed)
Dr. Everardo All informed. States NG tube should be replaced tomorrow and tube feeding be held for now. Patient has sugar check Q4 orders in ?

## 2021-05-16 NOTE — Assessment & Plan Note (Signed)
Blood pressure stable ? ?

## 2021-05-16 NOTE — Progress Notes (Signed)
Okay to use Dobhoff for feedings and medications, per Lynnette Caffey PA.  ?

## 2021-05-16 NOTE — Assessment & Plan Note (Signed)
Resolved

## 2021-05-16 NOTE — Assessment & Plan Note (Addendum)
Echocardiogram performed on 05/15/2021 showed ejection fraction 35 to 40%.  ?Condition stable, no evidence of exacerbation. ?

## 2021-05-16 NOTE — Assessment & Plan Note (Addendum)
CT scan also showed old pulmonary emboli.  We will discontinue Eliquis after decision is made about comfort care. ?

## 2021-05-16 NOTE — Assessment & Plan Note (Addendum)
Conservative management per neurosurgery.  Repeated CT scan did not get worse ?

## 2021-05-16 NOTE — Progress Notes (Signed)
Dobhoff with placed in left nare with CCU nurse Katie assistance. Tube was placed at 74cm xray was done to verify placement. Tube was not in correct place, dobhoff was then pull back and the advanced to 84cm. Abdominal xray was done, which showed dobhoff as the gastric fundus. Md was made aware, md wants tube advance more into the duodenum. Orders placed for IR to advance dobhoff.  ?

## 2021-05-16 NOTE — Assessment & Plan Note (Addendum)
Patient is managed conservatively per neurosurgery. ?Repeated head ct 4/7 did not get worse.  ?

## 2021-05-16 NOTE — Progress Notes (Addendum)
Patient removed their NG tube. PCCU contacted. Lysbeth Galas, RN notified at [phone]. Nurse will inform MD about replacing the NG tube and contact us regarding patient. ? ?NG tube is a 10 FR 1/2 dob huff. ?

## 2021-05-16 NOTE — Progress Notes (Signed)
Speech Language Pathology Treatment: Dysphagia  ?Patient Details ?Name: Charles Schmitt. ?MRN: [id number] ?DOB: 02-18-1955 ?Today's Date: 05/16/2021 ?Time: 7026-3785 ?SLP Time Calculation (min) (ACUTE ONLY): 10 min ? ?Assessment / Plan / Recommendation ?Clinical Impression ? Pt seen for PO readiness.  ? ?Pt presents with severely altered mentation when compared to this writer's session with pt six days ago.  Pt's attending (Dr Charles Schmitt) and pt's nurse Charles Schmitt) made aware that pt was conversant in simple conversation, able to follow 1-2 step directions, was oriented to place, able to answer basic yes/no questions, demonstrated selective attention to tasks, speech intelligibility was 80% at the conversation level and to talk about his girlfriend and their current living situation with her grandchildren. It was judged that pt was at new baseline of cognitive function as pt's cognition didn't improve from time of admission (05/04/2021) to cognitive assessment on 05/10/2021.  ? ?Today pt presents obtunded and unable to arouse to conversation. He responded very briefly with gunting when dubhoff was placed. Given pt's recent decline in mentation d/t severity of previous TBI, and his current declined mentation, pt has a poor prognosis for returning to safe PO intake to maintain nutrition and hydration needs. Spoke with pt's attending (Dr Charles Schmitt) and pt's nurse Charles Schmitt) about my recommendation for Palliative Care consult and the possibility of initiating Hospice services. ST will sign off at this time. Please re-consult ST services should pt's mentation improve and he is able to follow directions as well as sustain attention to tasks.  ? ? ?  ?HPI HPI: Per PCCM note, 05/15/21, "This is a 65 y/o male who was admitted on 3/25 after a fall at home with a traumatic SAH and SDH in the setting of alcohol intoxicatino. NSGY was consulted and recommended conservative management. Since admission he has been on the hospitalist service and  PT, OT and SLP have been following. He has noted to be weak and unable to perform his ADL's.  He was treated for Alcohol withdrawal with thiamine, folate, vitamins and lithium.  Palliative medicine saw him to discuss goals fo care on 3/28 during which time he remained full code and was focused on being able to leave the hospital.  The patient has been waiting for SNF during his hospitalization.  Paroxysmal atrial fibrillation, hypertension and diastolic heart failure have been managed by the primary service.  On 4/3 he had a change in mental status to becoming minimally responsive.  A head CT was repeated and showed no significant change in his small right subdural hematoma and 23mm right to left midline shift.       On 4/4 the patient became minimally responsive, tachycardic, tachypneic, and had soft blood pressure with associated respiratory alkalosis and lactic acidosis.  PCCM was consulted for further evaluation." ?  ?   ?SLP Plan ? Discharge SLP treatment due to (comment) (obtundness) ? ?  ?  ?Recommendations for follow up therapy are one component of a multi-disciplinary discharge planning process, led by the attending physician.  Recommendations may be updated based on patient status, additional functional criteria and insurance authorization. ?  ? ?Recommendations  ?Diet recommendations: NPO ?Medication Administration: Via alternative means  ?   ?    ?   ? ? ? ? Oral Care Recommendations: Oral care QID ?Follow Up Recommendations:  (Palliative Care) ?SLP Visit Diagnosis: Dysphagia, unspecified (R13.10);Cognitive communication deficit (R41.841) ?Plan: Discharge SLP treatment due to (comment) (obtundness) ? ? ? ? ?  ? Charles Schmitt B. Dreama Saa, M.S., CCC-SLP, CBIS ?Speech-Language  Pathologist ?Rehabilitation Services ?Office [phone] ? ? ? ?Charles Schmitt ? ?05/16/2021, 9:34 AM ?

## 2021-05-16 NOTE — Assessment & Plan Note (Addendum)
Patient has been seen by speech therapy, patient could not control his saliva, he was constantly aspirating. ?

## 2021-05-16 NOTE — Progress Notes (Signed)
ANTICOAGULATION CONSULT NOTE  ? ?Pharmacy Consult for IV Heparin ?Indication: DVT ? ?Patient Measurements: ?Height: 6\' 5"  (195.6 cm) ?Weight: 32.5 kg (71 lb 10.4 oz) ?IBW/kg (Calculated) : 89.1 ?Heparin Dosing Weight: 59.3 kg ? ?Labs: ?Recent Labs  ?  05/13/21 ?1242 05/13/21 ?1803 05/14/21 ?0403 05/15/21 ?0348 05/15/21 ?1327 05/15/21 ?2247 05/16/21 ?0543  ?HGB  --    < > 9.1* 8.6*  --   --  9.4*  ?HCT  --   --  26.9* 26.7*  --   --  28.5*  ?PLT  --   --  261 252  --   --  333  ?HEPARINUNFRC  --    < > 0.33 0.11* 0.22* 0.23* 0.35  ?CREATININE  --   --  1.35* 1.12  --   --   --   ?CKTOTAL 157  --   --   --   --   --   --   ? < > = values in this interval not displayed.  ? ? ? ?Estimated Creatinine Clearance: 31 mL/min (by C-G formula based on SCr of 1.12 mg/dL). ? ? ?Medical History: ?Past Medical History:  ?Diagnosis Date  ? Alcohol abuse   ? a. 1/5 of vodka daily.  ? Cardiomyopathy (HCC)   ? a. 01/2021 Echo: EF 30-35%, glob HK, apical AK. Mildly reduced RV fxn.  ? HFrEF (heart failure with reduced ejection fraction) (HCC)   ? Hyperlipidemia   ? Hypertension   ? PAF (paroxysmal atrial fibrillation) (HCC)   ? Tobacco abuse   ? ?No PTA meds per med rec and my chart review ? ?Assessment: ?65YOM initially presented after fall at home with traumatic SAH and SDH in the setting of alcohol intoxication. PMH paroxysmal Afib, HTN, dCHF. Overnight on 4/4, was readmitted to the ICU due to change in mental status and noted to have left leg swollen. Heparin started and then d/c'd early AM 4/4. Then, venous ultrasound confirmed DVT. Pharmacy has been consulted to restart IV heparin. ? ?Goal of Therapy:  ?Heparin level 0.3-0.7 units/ml ?Monitor platelets by anticoagulation protocol: Yes ? ?No bolus due to recent acute traumatic subdural or subarachnoid hemorrhage (though noted to be stable on multiple images) ?  ?Plan:  ?Heparin level is therapeutic x 1 ?Continue heparin infusion at 1400 units/hr ?Recheck HL in 6 hrs to  confirm ?Daily CBC per protocol ? ?6/4, PharmD, MBA ?05/16/2021 ?7:04 AM ? ? ? ? ? ?

## 2021-05-16 NOTE — Assessment & Plan Note (Deleted)
Patient had a residue of , Foley anchored.  ?

## 2021-05-16 NOTE — Assessment & Plan Note (Addendum)
Patient has completed 5 days IV antibiotics. ?Patient lung sounds worse again today, it appears that patient cannot clear his upper airway.  He still has significant saliva with drooling.  Repeated chest x-ray yesterday showed worsening bilateral lower lobe infiltrates, apparently patient has been aspirating constantly.  He has finished 5 days IV antibiotics. ?Patient is unlikely to survive, most likely will transfer to comfort care today.  Change CODE STATUS to DO NOT RESUSCITATE. ? ?

## 2021-05-16 NOTE — Assessment & Plan Note (Addendum)
Due to aspiration pneumonia.  Condition improved.  Will complete 5 days of IV antibiotics. ?

## 2021-05-16 NOTE — Progress Notes (Signed)
Unable to flush Dobhoff, Lynnette Caffey PA was made aware. She was able to flush it prior to coming back to floor. She spoke with Radiologist, not sure why it is not flushing. She will order another KUB ?

## 2021-05-16 NOTE — Assessment & Plan Note (Addendum)
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 ?

## 2021-05-16 NOTE — Progress Notes (Signed)
ANTICOAGULATION CONSULT NOTE  ? ?Pharmacy Consult for IV Heparin ?Indication: DVT ? ?Patient Measurements: ?Height: 6\' 5"  (195.6 cm) ?Weight: 32.5 kg (71 lb 10.4 oz) ?IBW/kg (Calculated) : 89.1 ?Heparin Dosing Weight: 59.3 kg ? ?Labs: ?Recent Labs  ?  05/14/21 ?0403 05/15/21 ?0348 05/15/21 ?1327 05/15/21 ?2247 05/16/21 ?07/16/21 05/16/21 ?1234  ?HGB 9.1* 8.6*  --   --  9.4*  --   ?HCT 26.9* 26.7*  --   --  28.5*  --   ?PLT 261 252  --   --  333  --   ?HEPARINUNFRC 0.33 0.11*   < > 0.23* 0.35 0.36  ?CREATININE 1.35* 1.12  --   --  1.03  --   ? < > = values in this interval not displayed.  ? ? ? ?Estimated Creatinine Clearance: 33.7 mL/min (by C-G formula based on SCr of 1.03 mg/dL). ? ? ?Medical History: ?Past Medical History:  ?Diagnosis Date  ? Alcohol abuse   ? a. 1/5 of vodka daily.  ? Cardiomyopathy (HCC)   ? a. 01/2021 Echo: EF 30-35%, glob HK, apical AK. Mildly reduced RV fxn.  ? HFrEF (heart failure with reduced ejection fraction) (HCC)   ? Hyperlipidemia   ? Hypertension   ? PAF (paroxysmal atrial fibrillation) (HCC)   ? Tobacco abuse   ? ?No PTA meds per med rec and my chart review ? ?Assessment: ?65YOM initially presented after fall at home with traumatic SAH and SDH in the setting of alcohol intoxication. PMH paroxysmal Afib, HTN, dCHF. Overnight on 4/4, was readmitted to the ICU due to change in mental status and noted to have left leg swollen. Heparin started and then d/c'd early AM 4/4. Then, venous ultrasound confirmed DVT. Pharmacy has been consulted to restart IV heparin. ? ?4/7 0543  HL 0.35  ?4/7 1234 HL 0.36  ? ?Goal of Therapy:  ?Heparin level 0.3-0.7 units/ml ?Monitor platelets by anticoagulation protocol: Yes ? ?No bolus due to recent acute traumatic subdural or subarachnoid hemorrhage (though noted to be stable on multiple images) ?  ?Plan:  ?Heparin level is therapeutic. Will continue heparin infusion at 1400 units/hr. Recheck heparin level and CBC with AM labs.  ? ?6/7,  PharmD ?05/16/2021 ?1:17 PM ? ? ? ? ? ?

## 2021-05-16 NOTE — Progress Notes (Signed)
?Progress Note ? ? ?Patient: Charles Schmitt. [id number] DOB: 11-02-1955 DOA: 05/03/2021     13 ?DOS: the patient was seen and examined on 05/16/2021 ?  ?Brief hospital course: ?This is a 66 y/o male who was admitted on 3/25 after a fall at home with a traumatic SAH and SDH in the setting of alcohol intoxicatino. NSGY was consulted and recommended conservative management. ?On 4/3 he had a change in mental status to becoming minimally responsive.  A head CT was repeated and showed no significant change in his small right subdural hematoma and 85mm right to left midline shift.  CT scan on 4/4 showed a stable midline shift. ?Patient has significant dysphagia, had developed aspiration pneumonia with sepsis.  Treated with antibiotics. ? ? ?Assessment and Plan: ?* SAH (subarachnoid hemorrhage) ([place]) ?Conservative management per neurosurgery. ? ?Hyperkalemia ?Resolved. ? ?Alcohol withdrawal delirium ([place]) ?Continue thiamine at high dose. ? ?Acute deep vein thrombosis (DVT) of calf muscle vein of left lower extremity ([place]) ?Continue heparin today, will transition to Eliquis once feeding tube is placed.  CT scan also showed old pulmonary emboli. ? ?Sepsis ([place]) ?Due to aspiration pneumonia.  Currently hemodynamically stable ? ?AKI (acute kidney injury) ([place]) ?Most likely ATN, renal function has improved ? ?Acute hypoxemic respiratory failure ([place]) ?Secondary to aspiration pneumonia.  Oxygenation seems to be gradually improving. ? 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  Dobbhoff will be placed, patient was started on tube feeding. ? ?Wernicke encephalopathy ?Multifactorial, secondary to subdural hematoma.  His mental status is waxing and waning, most likely Wernicke syndrome.  We will start high-dose thiamine.  Reviewed the patient brain CT scan on 4/4, patient has midline shift with 10 mm subdural hematoma.  But seems to be stable compared to  prior.  I will recheck a CT scan again today. ? ? ?Subdural hematoma ([place]) ?Patient is managed conservatively per neurosurgery. ? ?Protein-calorie malnutrition, severe ?Continue tube feeding ? ?Hypokalemia ?Reche[facility] at [facility], ? ?Chronic systolic heart failure ([place]) ?Echocardiogram performed on 05/15/2021 showed ejection fraction 35 to 40%.  Currently patient does not have CHF exacerbation. ? ?Pressure injury of skin ?Pressure Injury 05/03/21 Sacrum healed pressure injury (Active)  ?05/03/21 2330  ?Location: Sacrum  ?Location Orientation:   ?Staging:   ?Wound Description (Comments): healed pressure injury  ?Present on Admission: Yes  ?   ?Pressure Injury 05/14/21 Buttocks Stage 1 -  Intact skin with non-blanchable redness of a localized area usually over a bony prominence. non blanchable redness/discoloration to buttocks and sacral area (Active)  ?05/14/21 2000  ?Location: Buttocks (and sacral area)  ?Location Orientation:   ?Staging: Stage 1 -  Intact skin with non-blanchable redness of a localized area usually over a bony prominence.  ?Wound Description (Comments): non blanchable redness/discoloration to buttocks and sacral area  ?Present on Admission:   ? ? ?Followed by RN, try to improve nutrition status ? ?Essential hypertension ?Blood pressure stable. ? ?Aspiration pneumonia ([place]) ?Patient had significant aspiration due to dysphagia.  Chest CT scan showed aspiration pneumonia.  Antibiotic changed to Unasyn. ? ? ? ? ?  ? ?Subjective:  ?Patient is very confused, minimally responsive. ?Spoke with the nurse, patient slept last night.  Does not have any short of breath. ? ?Physical Exam: ?Vitals:  ? 05/15/21 2304 05/16/21 0102 05/16/21 0331 05/16/21 [id number]  ?BP: (!) 119/93 98/81 118/84 (!) 117/96  ?Pulse: 86 87 87 85  ?Resp: 19 20 19 14   ?Temp:  98.5 ?F (36.9 ?C) 98.3 ?F (36.8 ?C) 98.1 ?F (36.7 ?C) 98.2 ?F (36.8 ?C)  ?TempSrc:      ?SpO2: 99% 96% 98% 99%  ?Weight: 32.5 kg     ?Height:      ? ?General exam:  Ill-appearing, cachectic. ?Respiratory system: Clear to auscultation. Respiratory effort normal. ?Cardiovascular system: S1 & S2 heard, RRR. No JVD, murmurs, rubs, gallops or clicks. No pedal edema. ?Gastrointestinal system: Abdomen is nondistended, soft and nontender. No organomegaly or masses felt. Normal bowel sounds heard. ?Central nervous system: Opening eyes only. ?Extremities: Significant muscle atrophy. ?Skin: Decubitus ulcer noted. ? ? ?Data Reviewed: ? ?Reviewed prior imaging studies including x-rays and CT scans.  Reviewed all lab results. ? ?Family Communication: Updated daughter in law, who is listed as first contact. ?Disposition: ?Status is: Inpatient ?Remains inpatient appropriate because: Severity of disease, altered mental status, IV treatment. ? Planned Discharge Destination: Skilled nursing facility ? ? ? ?Time spent: 52 minutes ? ?Author: ?Sharen Hones, MD ?05/16/2021 11:01 AM ? ?For on call review [email or web address].  ?

## 2021-05-16 NOTE — Assessment & Plan Note (Addendum)
Repeated CT scan 4/8 did not show worsening intracranial hemorrhage. ?Patient still has significant confusion, he sleeps more than 20 hours a day.  Had a long discussion with the family, patient condition appears to be terminal. ? ? ?

## 2021-05-17 DIAGNOSIS — I82462 Acute embolism and thrombosis of left calf muscular vein: Secondary | ICD-10-CM

## 2021-05-17 DIAGNOSIS — E870 Hyperosmolality and hypernatremia: Secondary | ICD-10-CM

## 2021-05-17 LAB — BASIC METABOLIC PANEL
Anion gap: 8 (ref 5–15)
BUN: 21 mg/dL (ref 8–23)
CO2: 28 mmol/L (ref 22–32)
Calcium: 9 mg/dL (ref 8.9–10.3)
Chloride: 110 mmol/L (ref 98–111)
Creatinine, Ser: 0.97 mg/dL (ref 0.61–1.24)
GFR, Estimated: >60 mL/min (ref >60)
Glucose, Bld: 89 mg/dL (ref 70–99)
Potassium: 3 mmol/L — ABNORMAL LOW (ref 3.5–5.1)
Sodium: 146 mmol/L — ABNORMAL HIGH (ref 135–145)

## 2021-05-17 LAB — AMMONIA: Ammonia: 21 umol/L (ref 9–35)

## 2021-05-17 LAB — CBC WITH DIFFERENTIAL/PLATELET
Abs Immature Granulocytes: 0.91 10*3/uL — ABNORMAL HIGH (ref 0.00–0.07)
Basophils Absolute: 0.1 10*3/uL (ref 0.0–0.1)
Basophils Relative: 1 %
Eosinophils Absolute: 0.1 10*3/uL (ref 0.0–0.5)
Eosinophils Relative: 1 %
HCT: 30.1 % — ABNORMAL LOW (ref 39.0–52.0)
Hemoglobin: 9.6 g/dL — ABNORMAL LOW (ref 13.0–17.0)
Immature Granulocytes: 8 %
Lymphocytes Relative: 10 %
Lymphs Abs: 1.2 10*3/uL (ref 0.7–4.0)
MCH: 32.4 pg (ref 26.0–34.0)
MCHC: 31.9 g/dL (ref 30.0–36.0)
MCV: 101.7 fL — ABNORMAL HIGH (ref 80.0–100.0)
Monocytes Absolute: 1 10*3/uL (ref 0.1–1.0)
Monocytes Relative: 8 %
Neutro Abs: 8.5 10*3/uL — ABNORMAL HIGH (ref 1.7–7.7)
Neutrophils Relative %: 72 %
Platelets: 338 10*3/uL (ref 150–400)
RBC: 2.96 MIL/uL — ABNORMAL LOW (ref 4.22–5.81)
RDW: 14.1 % (ref 11.5–15.5)
WBC: 11.9 10*3/uL — ABNORMAL HIGH (ref 4.0–10.5)
nRBC: 1.2 % — ABNORMAL HIGH (ref 0.0–0.2)

## 2021-05-17 LAB — GLUCOSE, CAPILLARY
Glucose-Capillary: 150 mg/dL — ABNORMAL HIGH (ref 70–99)
Glucose-Capillary: 83 mg/dL (ref 70–99)
Glucose-Capillary: 142 mg/dL — ABNORMAL HIGH (ref 70–99)
Glucose-Capillary: 103 mg/dL — ABNORMAL HIGH (ref 70–99)
Glucose-Capillary: 122 mg/dL — ABNORMAL HIGH (ref 70–99)

## 2021-05-17 LAB — HEPARIN LEVEL (UNFRACTIONATED): Heparin Unfractionated: 0.48 IU/mL (ref 0.30–0.70)

## 2021-05-17 LAB — PHOSPHORUS: Phosphorus: 1.4 mg/dL — ABNORMAL LOW (ref 2.5–4.6)

## 2021-05-17 LAB — MAGNESIUM: Magnesium: 2.5 mg/dL — ABNORMAL HIGH (ref 1.7–2.4)

## 2021-05-17 MED ORDER — APIXABAN 5 MG PO TABS
5.0000 mg | ORAL_TABLET | Freq: Two times a day (BID) | ORAL | Status: DC
Start: 2021-05-24 — End: 2021-05-20

## 2021-05-17 MED ORDER — APIXABAN 5 MG PO TABS: 5.0000 mg | ORAL_TABLET | Freq: Two times a day (BID) | ORAL | Status: DC

## 2021-05-17 MED ORDER — POTASSIUM PHOSPHATES 15 MMOLE/5ML IV SOLN
30.0000 mmol | Freq: Once | INTRAVENOUS | Status: AC
  Administered 2021-05-17: 30 mmol via INTRAVENOUS
  Filled 2021-05-17: qty 10

## 2021-05-17 MED ORDER — PANTOPRAZOLE 2 MG/ML SUSPENSION
40.0000 mg | Freq: Every day | ORAL | Status: DC
  Administered 2021-05-17 – 2021-05-20 (×4): 40 mg
  Filled 2021-05-17 (×4): qty 20

## 2021-05-17 MED ORDER — APIXABAN 5 MG PO TABS
10.0000 mg | ORAL_TABLET | Freq: Two times a day (BID) | ORAL | Status: DC
  Administered 2021-05-17 – 2021-05-20 (×7): 10 mg
  Filled 2021-05-17 (×7): qty 2

## 2021-05-17 MED ORDER — FREE WATER
100.0000 mL | Status: DC
Start: 2021-05-17 — End: 2021-05-20
  Administered 2021-05-17 – 2021-05-20 (×19): 100 mL

## 2021-05-17 MED ORDER — APIXABAN 5 MG PO TABS: 10.0000 mg | ORAL_TABLET | Freq: Two times a day (BID) | ORAL | Status: DC

## 2021-05-17 MED ORDER — POTASSIUM CHLORIDE 20 MEQ PO PACK
40.0000 meq | PACK | Freq: Once | ORAL | Status: AC
Start: 2021-05-17 — End: 2021-05-17
  Administered 2021-05-17: 40 meq
  Filled 2021-05-17: qty 2

## 2021-05-17 NOTE — Progress Notes (Signed)
ANTICOAGULATION CONSULT NOTE  ? ?Pharmacy Consult for IV Heparin to apixaban  ?Indication: DVT ? ?Patient Measurements: ?Height: 6\' 5"  (195.6 cm) ?Weight: 31.8 kg (70 lb 2.8 oz) ?IBW/kg (Calculated) : 89.1 ?Heparin Dosing Weight: 59.3 kg ? ?Labs: ?Recent Labs  ?  05/15/21 ?0348 05/15/21 ?1327 05/16/21 ?0543 05/16/21 ?1234 05/17/21 ?0439  ?HGB 8.6*  --  9.4*  --  9.6*  ?HCT 26.7*  --  28.5*  --  30.1*  ?PLT 252  --  333  --  338  ?HEPARINUNFRC 0.11*   < > 0.35 0.36 0.48  ?CREATININE 1.12  --  1.03  --  0.97  ? < > = values in this interval not displayed.  ? ? ? ?Estimated Creatinine Clearance: 35.1 mL/min (by C-G formula based on SCr of 0.97 mg/dL). ? ? ?Medical History: ?Past Medical History:  ?Diagnosis Date  ? Alcohol abuse   ? a. 1/5 of vodka daily.  ? Cardiomyopathy ([place])   ? a. 01/2021 Echo: EF 30-35%, glob HK, apical AK. Mildly reduced RV fxn.  ? HFrEF (heart failure with reduced ejection fraction) ([place])   ? Hyperlipidemia   ? Hypertension   ? PAF (paroxysmal atrial fibrillation) ([place])   ? Tobacco abuse   ? ?No PTA meds per med rec and my chart review ? ?Assessment: ?65YOM initially presented after fall at home with traumatic SAH and SDH in the setting of alcohol intoxication. PMH paroxysmal Afib, HTN, dCHF. Overnight on 4/4, was readmitted to the ICU due to change in mental status and noted to have left leg swollen. Heparin started and then d/c'd early AM 4/4. Then, venous ultrasound confirmed DVT and IV heparin was started. Pharmacy now consulted to transition from heparin to apixaban ? ?Goal of Therapy:  ?Heparin level 0.3-0.7 units/ml ?Monitor platelets by anticoagulation protocol: Yes ? ? ?Plan:  ?Stop IV heparin  ?Start apixaban 10 mg twice daily for 7 days followed by 5 mg twice daily  ?CBC at least once weekly per protocol ? ?Vallery Sa, PharmD, BCPS ?Clinical Pharmacist ?05/17/2021 ? ? ? ? ? ?

## 2021-05-17 NOTE — Assessment & Plan Note (Addendum)
This most likely due to intracranial hemorrhage, not from dehydration.  ?Sodium level normalized after giving a dose of metolazone ?

## 2021-05-17 NOTE — Assessment & Plan Note (Addendum)
Phosphorus level has normalized. ?

## 2021-05-17 NOTE — Progress Notes (Signed)
ANTICOAGULATION CONSULT NOTE  ? ?Pharmacy Consult for IV Heparin ?Indication: DVT ? ?Patient Measurements: ?Height: 6\' 5"  (195.6 cm) ?Weight: 31.8 kg (70 lb 2.8 oz) ?IBW/kg (Calculated) : 89.1 ?Heparin Dosing Weight: 59.3 kg ? ?Labs: ?Recent Labs  ?  05/15/21 ?0348 05/15/21 ?1327 05/16/21 ?0543 05/16/21 ?1234 05/17/21 ?0439  ?HGB 8.6*  --  9.4*  --  9.6*  ?HCT 26.7*  --  28.5*  --  30.1*  ?PLT 252  --  333  --  338  ?HEPARINUNFRC 0.11*   < > 0.35 0.36 0.48  ?CREATININE 1.12  --  1.03  --  0.97  ? < > = values in this interval not displayed.  ? ? ? ?Estimated Creatinine Clearance: 35.1 mL/min (by C-G formula based on SCr of 0.97 mg/dL). ? ? ?Medical History: ?Past Medical History:  ?Diagnosis Date  ? Alcohol abuse   ? a. 1/5 of vodka daily.  ? Cardiomyopathy (HCC)   ? a. 01/2021 Echo: EF 30-35%, glob HK, apical AK. Mildly reduced RV fxn.  ? HFrEF (heart failure with reduced ejection fraction) (HCC)   ? Hyperlipidemia   ? Hypertension   ? PAF (paroxysmal atrial fibrillation) (HCC)   ? Tobacco abuse   ? ?No PTA meds per med rec and my chart review ? ?Assessment: ?65YOM initially presented after fall at home with traumatic SAH and SDH in the setting of alcohol intoxication. PMH paroxysmal Afib, HTN, dCHF. Overnight on 4/4, was readmitted to the ICU due to change in mental status and noted to have left leg swollen. Heparin started and then d/c'd early AM 4/4. Then, venous ultrasound confirmed DVT. Pharmacy has been consulted to restart IV heparin. ? ?4/7 0543  HL 0.35  ?4/7 1234  HL 0.36  ?4/8  0439 Hl 0.48 ? ?Goal of Therapy:  ?Heparin level 0.3-0.7 units/ml ?Monitor platelets by anticoagulation protocol: Yes ? ?No bolus due to recent acute traumatic subdural or subarachnoid hemorrhage (though noted to be stable on multiple images) ?  ?Plan:  ?Heparin level is therapeutic. Will continue heparin infusion at 1400 units/hr. Recheck heparin level and CBC with AM labs.  ? ?6/8 PharmD ?Clinical  Pharmacist ?05/17/2021 ? ? ? ? ? ?

## 2021-05-17 NOTE — Progress Notes (Addendum)
?Progress Note ? ? ?Patient: Charles Schmitt. PNP:[id number] DOB: [birth year] DOA: 05/03/2021     14 ?DOS: the patient was seen and examined on 05/17/2021 ?  ?Brief hospital course: ?This is a 66 y/o male who was admitted on 3/25 after a fall at home with a traumatic SAH and SDH in the setting of alcohol intoxicatino. NSGY was consulted and recommended conservative management. ?On 4/3 he had a change in mental status to becoming minimally responsive.  A head CT was repeated and showed no significant change in his small right subdural hematoma and 56mm right to left midline shift.  CT scan on 4/4 showed a stable midline shift. ?Patient has significant dysphagia, had developed aspiration pneumonia with sepsis.  Treated with antibiotics. ? ? ?Assessment and Plan: ?* SAH (subarachnoid hemorrhage) (HCC) ?Conservative management per neurosurgery.  Repeated CT scan did not get worse ? ?Hypophosphatemia ?Phos 1.5, give 64mM of iv k phos  ? ?Hypernatremia ?Increase free water via TF ? ?Thoracic aortic aneurysm (HCC) ?No acute complication, follow-up with PCP as outpatient after discharge. ? ?Hyperkalemia ?Resolved. ? ?Alcohol withdrawal delirium (HCC) ?Continue thiamine at high dose. ? ?Acute deep vein thrombosis (DVT) of calf muscle vein of left lower extremity (HCC) ?  CT scan also showed old pulmonary emboli.  Change anticoagulation to Eliquis. ? ?Sepsis (HCC) ?Due to aspiration pneumonia.  Currently hemodynamically stable ? ?AKI (acute kidney injury) (HCC) ?Most likely ATN, renal function has improved ? ?Acute hypoxemic respiratory failure (HCC) ?Secondary to aspiration pneumonia.  Currently off oxygen with good saturation ? 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 ? ?Wernicke encephalopathy ?Repeated had a CT scan yesterday did not show worsening intracranial hemorrhage. ?Patient appears to be more alert yesterday afternoon, but still most likely has Wernicke  encephalopathy.  Continue high-dose of thiamine. ?Ammonia level not elevated ? ? ?Subdural hematoma (HCC) ?Patient is managed conservatively per neurosurgery. ?Repeated head ct 4/7 did not get worse.  ? ?Protein-calorie malnutrition, severe ?Continue tube feeding ? ?Hypokalemia ?We will give 30 mM of potassium phosphate.  In addition to that, he will receive 40 mg oral potassium via feeding tube. ? ?Chronic systolic heart failure (HCC) ?Echocardiogram performed on 05/15/2021 showed ejection fraction 35 to 40%.  Currently patient does not have CHF exacerbation. ? ?Pressure injury of skin ?Pressure Injury 05/03/21 Sacrum healed pressure injury (Active)  ?05/03/21 2330  ?Location: Sacrum  ?Location Orientation:   ?Staging:   ?Wound Description (Comments): healed pressure injury  ?Present on Admission: Yes  ?   ?Pressure Injury 05/14/21 Buttocks Stage 1 -  Intact skin with non-blanchable redness of a localized area usually over a bony prominence. non blanchable redness/discoloration to buttocks and sacral area (Active)  ?05/14/21 2000  ?Location: Buttocks (and sacral area)  ?Location Orientation:   ?Staging: Stage 1 -  Intact skin with non-blanchable redness of a localized area usually over a bony prominence.  ?Wound Description (Comments): non blanchable redness/discoloration to buttocks and sacral area  ?Present on Admission:   ? ? ?Followed by RN, try to improve nutrition status ? ?Essential hypertension ?Blood pressure stable. ? ?Aspiration pneumonia (HCC) ?We will continue complete 5 days of IV antibiotics now with Unasyn. ? ? ? ? ?  ? ?Subjective:  ?Patient is quite drowsy this morning, but less confused.  Off oxygen without shortness of breath. ? ?Physical Exam: ?Vitals:  ? 05/16/21 2351 05/17/21 0351 05/17/21 0500 05/17/21 0816  ?BP: 106/74 104/72  115/85  ?Pulse: 99  99  92  ?Resp: 18 16  18   ?Temp: 98 ?F (36.7 ?C) 98.3 ?F (36.8 ?C)  98 ?F (36.7 ?C)  ?TempSrc:      ?SpO2: 100% 99%  99%  ?Weight:   31.8 kg    ?Height:      ? ?General exam: Appears calm and comfortable.  Severely malnourished ?Respiratory system: Clear to auscultation. Respiratory effort normal. ?Cardiovascular system: S1 & S2 heard, RRR. No JVD, murmurs, rubs, gallops or clicks. No pedal edema. ?Gastrointestinal system: Abdomen is nondistended, soft and nontender. No organomegaly or masses felt. Normal bowel sounds heard. ?Central nervous system: Drowsy and oriented x2. No focal neurological deficits. ?Extremities: Symmetric 5 x 5 power. ?Skin: No rashes, lesions or ulcers ?Psychiatry: Judgement and insight appear normal. Mood & affect appropriate.   ?Data Reviewed: ? ?Reviewed the CT scan results, reviewed all labs. ? ?Family Communication:  ?Had a family meeting with 2 sisters and brother-in-law, updated patient condition, confirmed DNR status. ?We will talk to son tomorrow at [phone]. ? ?Disposition: ?Status is: Inpatient ?Remains inpatient appropriate because: Severity of disease, altered mental status and IV treatment. ? Planned Discharge Destination: Home with Home Health ? ? ? ?Time spent: 38 minutes ? ?Author: ?[phone], MD ?05/17/2021 11:01 AM ? ?For on call review [email or web address].  ?

## 2021-05-18 LAB — CBC
HCT: 27.6 % — ABNORMAL LOW (ref 39.0–52.0)
Hemoglobin: 8.7 g/dL — ABNORMAL LOW (ref 13.0–17.0)
MCH: 32 pg (ref 26.0–34.0)
MCHC: 31.5 g/dL (ref 30.0–36.0)
MCV: 101.5 fL — ABNORMAL HIGH (ref 80.0–100.0)
Platelets: 341 10*3/uL (ref 150–400)
RBC: 2.72 MIL/uL — ABNORMAL LOW (ref 4.22–5.81)
RDW: 14.6 % (ref 11.5–15.5)
WBC: 9.2 10*3/uL (ref 4.0–10.5)
nRBC: 1.2 % — ABNORMAL HIGH (ref 0.0–0.2)

## 2021-05-18 LAB — CULTURE, BLOOD (ROUTINE X 2)
Culture: NO GROWTH
Culture: NO GROWTH
Special Requests: ADEQUATE

## 2021-05-18 LAB — VITAMIN B12: Vitamin B-12: 302 pg/mL (ref 180–914)

## 2021-05-18 LAB — BASIC METABOLIC PANEL
Anion gap: 7 (ref 5–15)
BUN: 18 mg/dL (ref 8–23)
CO2: 29 mmol/L (ref 22–32)
Calcium: 8.5 mg/dL — ABNORMAL LOW (ref 8.9–10.3)
Chloride: 112 mmol/L — ABNORMAL HIGH (ref 98–111)
Creatinine, Ser: 0.85 mg/dL (ref 0.61–1.24)
GFR, Estimated: >60 mL/min (ref >60)
Glucose, Bld: 141 mg/dL — ABNORMAL HIGH (ref 70–99)
Potassium: 3.3 mmol/L — ABNORMAL LOW (ref 3.5–5.1)
Sodium: 148 mmol/L — ABNORMAL HIGH (ref 135–145)

## 2021-05-18 LAB — MAGNESIUM: Magnesium: 2.3 mg/dL (ref 1.7–2.4)

## 2021-05-18 LAB — GLUCOSE, CAPILLARY
Glucose-Capillary: 108 mg/dL — ABNORMAL HIGH (ref 70–99)
Glucose-Capillary: 110 mg/dL — ABNORMAL HIGH (ref 70–99)
Glucose-Capillary: 121 mg/dL — ABNORMAL HIGH (ref 70–99)
Glucose-Capillary: 126 mg/dL — ABNORMAL HIGH (ref 70–99)
Glucose-Capillary: 138 mg/dL — ABNORMAL HIGH (ref 70–99)
Glucose-Capillary: 143 mg/dL — ABNORMAL HIGH (ref 70–99)

## 2021-05-18 LAB — PHOSPHORUS: Phosphorus: 2.5 mg/dL (ref 2.5–4.6)

## 2021-05-18 MED ORDER — POTASSIUM CHLORIDE 10 MEQ/100ML IV SOLN
10.0000 meq | INTRAVENOUS | Status: AC
  Administered 2021-05-18 (×4): 10 meq via INTRAVENOUS
  Filled 2021-05-18 (×4): qty 100

## 2021-05-18 MED ORDER — METOLAZONE 2.5 MG PO TABS
2.5000 mg | ORAL_TABLET | Freq: Once | ORAL | Status: AC
  Administered 2021-05-18: 2.5 mg
  Filled 2021-05-18: qty 1

## 2021-05-18 NOTE — Progress Notes (Signed)
?Progress Note ? ? ?Patient: Charles Schmitt. XKG:[id number] DOB: May 30, 1955 DOA: 05/03/2021     15 ?DOS: the patient was seen and examined on 05/18/2021 ?  ?Brief hospital course: ?This is a 66 y/o male who was admitted on 3/25 after a fall at home with a traumatic SAH and SDH in the setting of alcohol intoxicatino. NSGY was consulted and recommended conservative management. ?On 4/3 he had a change in mental status to becoming minimally responsive.  A head CT was repeated and showed no significant change in his small right subdural hematoma and 24mm right to left midline shift.  CT scan on 4/4 showed a stable midline shift. ?Patient has significant dysphagia, had developed aspiration pneumonia with sepsis.  Treated with antibiotics. ? ? ?Assessment and Plan: ?* SAH (subarachnoid hemorrhage) (HCC) ?Conservative management per neurosurgery.  Repeated CT scan did not get worse ? ?Hypophosphatemia ?Normalized after giving IV potassium phosphate.  We will continue monitor Phos level as patient is receiving tube feeding. ? ?Hypernatremia ?This most likely due to intracranial hemorrhage, not from dehydration.  Sodium level is getting worse after giving increased level of free water.  We will give a dose of metolazone and continue monitored sodium level. ? ?Thoracic aortic aneurysm (HCC) ?No acute complication, follow-up with PCP as outpatient after discharge. ? ?Hyperkalemia ?Resolved. ? ?Alcohol withdrawal delirium (HCC) ?Continue thiamine at high dose. ? ?Acute deep vein thrombosis (DVT) of calf muscle vein of left lower extremity (HCC) ?  CT scan also showed old pulmonary emboli.  Continue Eliquis. ? ?Sepsis (HCC) ?Due to aspiration pneumonia.  Condition improved.  Will complete 5 days of IV antibiotics. ? ?AKI (acute kidney injury) (HCC) ?Most likely ATN, renal function has improved ? ?Acute hypoxemic respiratory failure (HCC) ?Secondary to aspiration pneumonia.  Currently off oxygen with good saturation ? ?Dysphagia due  to old intracerebral hemorrhage ?Dobbhoff placed, continue tube feeding, still n.p.o. we will reassess by speech therapy after mental status improves ? ?Wernicke encephalopathy ?Repeated CT scan 4/8 did not show worsening intracranial hemorrhage. ?Patient was given thiamine 500 mg IV every 8 hours for suspected Wernicke encephalopathy. ?Patient is more awake today, still has significant confusion. ?He also has significant alcohol dementia.  Prognosis of his mental status is uncertain. ? ? ? ?Subdural hematoma (HCC) ?Patient is managed conservatively per neurosurgery. ?Repeated head ct 4/7 did not get worse.  ? ?Protein-calorie malnutrition, severe ?Continue tube feeding. ? ?Hypokalemia ?Potassium is 3.3, will give additional 40 mEq IV potassium. ? ?Chronic systolic heart failure (HCC) ?Echocardiogram performed on 05/15/2021 showed ejection fraction 35 to 40%.  ?Condition stable, no evidence of exacerbation. ? ?Pressure injury of skin ?Pressure Injury 05/03/21 Sacrum healed pressure injury (Active)  ?05/03/21 2330  ?Location: Sacrum  ?Location Orientation:   ?Staging:   ?Wound Description (Comments): healed pressure injury  ?Present on Admission: Yes  ?   ?Pressure Injury 05/14/21 Buttocks Stage 1 -  Intact skin with non-blanchable redness of a localized area usually over a bony prominence. non blanchable redness/discoloration to buttocks and sacral area (Active)  ?05/14/21 2000  ?Location: Buttocks (and sacral area)  ?Location Orientation:   ?Staging: Stage 1 -  Intact skin with non-blanchable redness of a localized area usually over a bony prominence.  ?Wound Description (Comments): non blanchable redness/discoloration to buttocks and sacral area  ?Present on Admission:   ? ? ?Followed by RN, try to improve nutrition status ? ?Essential hypertension ?Blood pressure stable. ? ?Aspiration pneumonia (HCC) ?We will continue complete 5 days  of IV antibiotics now with Unasyn. ? ? ? ? ?  ? ?Subjective:  ?Patient is more  awake today, still quite confused. ?No short of breath or hypoxemia ? ?Physical Exam: ?Vitals:  ? 05/18/21 0443 05/18/21 0500 05/18/21 0754 05/18/21 1217  ?BP: 127/69  (!) 137/93 134/85  ?Pulse: 97  89 87  ?Resp: 19  16 17   ?Temp: 98.6 ?F (37 ?C)  98.4 ?F (36.9 ?C) 98.1 ?F (36.7 ?C)  ?TempSrc: Oral     ?SpO2: 99%  98% 100%  ?Weight:  31.7 kg    ?Height:      ? ?General exam: Ill-appearing, severely malnourished with significant muscle atrophy. ?Respiratory system: Clear to auscultation. Respiratory effort normal. ?Cardiovascular system: S1 & S2 heard, RRR. No JVD, murmurs, rubs, gallops or clicks. No pedal edema. ?Gastrointestinal system: Abdomen is nondistended, soft and nontender. No organomegaly or masses felt. Normal bowel sounds heard. ?Central nervous system: Alert and oriented x2. No focal neurological deficits. ?Extremities: Symmetric 5 x 5 power. ?Skin: No rashes, lesions or ulcers ? ? ?Data Reviewed: ? ?Reviewed all lab results. ? ?Family Communication: Son and daughter-in-law updated. ? ?Disposition: ?Status is: Inpatient ?Remains inpatient appropriate because: Severity of disease, altered mental status, IV antibiotics. ? Planned Discharge Destination: Skilled nursing facility ? ? ? ?Time spent: 42 minutes ? ?Author: ? , MD ?05/18/2021 12:23 PM ? ?For on call review [email or web address].  ?

## 2021-05-19 ENCOUNTER — Inpatient Hospital Stay: Payer: Medicaid Other

## 2021-05-19 DIAGNOSIS — J690 Pneumonitis due to inhalation of food and vomit: Secondary | ICD-10-CM

## 2021-05-19 DIAGNOSIS — E43 Unspecified severe protein-calorie malnutrition: Secondary | ICD-10-CM

## 2021-05-19 DIAGNOSIS — E512 Wernicke's encephalopathy: Secondary | ICD-10-CM

## 2021-05-19 DIAGNOSIS — J9601 Acute respiratory failure with hypoxia: Secondary | ICD-10-CM

## 2021-05-19 LAB — CBC
HCT: 28.7 % — ABNORMAL LOW (ref 39.0–52.0)
Hemoglobin: 9.3 g/dL — ABNORMAL LOW (ref 13.0–17.0)
MCH: 33 pg (ref 26.0–34.0)
MCHC: 32.4 g/dL (ref 30.0–36.0)
MCV: 101.8 fL — ABNORMAL HIGH (ref 80.0–100.0)
Platelets: 353 10*3/uL (ref 150–400)
RBC: 2.82 MIL/uL — ABNORMAL LOW (ref 4.22–5.81)
RDW: 14.7 % (ref 11.5–15.5)
WBC: 10.2 10*3/uL (ref 4.0–10.5)
nRBC: 0.3 % — ABNORMAL HIGH (ref 0.0–0.2)

## 2021-05-19 LAB — MAGNESIUM: Magnesium: 2.1 mg/dL (ref 1.7–2.4)

## 2021-05-19 LAB — BASIC METABOLIC PANEL
Anion gap: 7 (ref 5–15)
BUN: 16 mg/dL (ref 8–23)
CO2: 27 mmol/L (ref 22–32)
Calcium: 8.9 mg/dL (ref 8.9–10.3)
Chloride: 107 mmol/L (ref 98–111)
Creatinine, Ser: 0.81 mg/dL (ref 0.61–1.24)
GFR, Estimated: >60 mL/min (ref >60)
Glucose, Bld: 140 mg/dL — ABNORMAL HIGH (ref 70–99)
Potassium: 3.9 mmol/L (ref 3.5–5.1)
Sodium: 141 mmol/L (ref 135–145)

## 2021-05-19 LAB — PHOSPHORUS: Phosphorus: 2.8 mg/dL (ref 2.5–4.6)

## 2021-05-19 LAB — GLUCOSE, CAPILLARY
Glucose-Capillary: 133 mg/dL — ABNORMAL HIGH (ref 70–99)
Glucose-Capillary: 134 mg/dL — ABNORMAL HIGH (ref 70–99)
Glucose-Capillary: 135 mg/dL — ABNORMAL HIGH (ref 70–99)
Glucose-Capillary: 139 mg/dL — ABNORMAL HIGH (ref 70–99)
Glucose-Capillary: 141 mg/dL — ABNORMAL HIGH (ref 70–99)
Glucose-Capillary: 147 mg/dL — ABNORMAL HIGH (ref 70–99)
Glucose-Capillary: 158 mg/dL — ABNORMAL HIGH (ref 70–99)

## 2021-05-19 MED ORDER — FUROSEMIDE 10 MG/ML IJ SOLN
40.0000 mg | Freq: Once | INTRAMUSCULAR | Status: AC
  Administered 2021-05-19: 40 mg via INTRAVENOUS
  Filled 2021-05-19: qty 4

## 2021-05-19 MED ORDER — LOPERAMIDE HCL 1 MG/7.5ML PO SUSP
2.0000 mg | ORAL | Status: DC | PRN
  Filled 2021-05-19: qty 15

## 2021-05-19 NOTE — Progress Notes (Signed)
Assumed care of pt at 1900. Oriented except for year, alert at times, drowsy at other times, responding to voice. RA. No c/o pain overnight. NG tube remains in place, and continuous feedings infusing per MAR. Full assessment per flowsheets. Medication administration per MAR. Incontinent of bowel and bladder needing frequent care. External catheter in place. Call bell within reach, but pt needing assistance making needs known. Frequent rounding, close to nurses station. Comfort and safety maintained.  ?

## 2021-05-19 NOTE — Progress Notes (Addendum)
?Progress Note ? ? ?Patient: Charles Schmitt. DJM:[id number] DOB: [birth year] DOA: 05/03/2021     16 ?DOS: the patient was seen and examined on 05/19/2021 ?  ?Brief hospital course: ?This is a 66 y/o male who was admitted on 3/25 after a fall at home with a traumatic SAH and SDH in the setting of alcohol intoxicatino. NSGY was consulted and recommended conservative management. ?On 4/3 he had a change in mental status to becoming minimally responsive.  A head CT was repeated and showed no significant change in his small right subdural hematoma and 64mm right to left midline shift.  CT scan on 4/4 showed a stable midline shift. ?Patient has significant dysphagia, had developed aspiration pneumonia with sepsis.  Treated with antibiotics. ? ? ?Assessment and Plan: ?* SAH (subarachnoid hemorrhage) (HCC) ?Conservative management per neurosurgery.  Repeated CT scan did not get worse ? ?Hypophosphatemia ?Phosphorus level has normalized. ? ?Hypernatremia ?This most likely due to intracranial hemorrhage, not from dehydration.  ?Sodium level normalized after giving a dose of metolazone ? ?Thoracic aortic aneurysm (HCC) ?No acute complication, follow-up with PCP as outpatient after discharge. ? ?Hyperkalemia ?Resolved. ? 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 ? ?Acute deep vein thrombosis (DVT) of calf muscle vein of left lower extremity (HCC) ?  CT scan also showed old pulmonary emboli.  Continue Eliquis. ? ?Sepsis (HCC) ?Due to aspiration pneumonia.  Condition improved.  Will complete 5 days of IV antibiotics. ? ?AKI (acute kidney injury) (HCC) ?Most likely ATN, renal function has improved ? ?Acute hypoxemic respiratory failure (HCC) ?Secondary to aspiration pneumonia.  Currently off oxygen with good saturation ? 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 ? ?Wernicke encephalopathy ?Repeated CT scan 4/8 did not show worsening  intracranial hemorrhage. ?Patient is given thiamine 500 mg IV every 8 hours for suspected Wernicke encephalopathy. ?Patient does not seem to make any improvement; I will continue finish the course of thiamine ? ? ? ?Subdural hematoma (HCC) ?Patient is managed conservatively per neurosurgery. ?Repeated head ct 4/7 did not get worse.  ? ?Protein-calorie malnutrition, severe ?Continue tube feeding. ? ?Hypokalemia ?Potassium has normalized. ? ?Chronic systolic heart failure (HCC) ?Echocardiogram performed on 05/15/2021 showed ejection fraction 35 to 40%.  ?Condition stable, no evidence of exacerbation. ? ?Pressure injury of skin ?Pressure Injury 05/03/21 Sacrum healed pressure injury (Active)  ?05/03/21 2330  ?Location: Sacrum  ?Location Orientation:   ?Staging:   ?Wound Description (Comments): healed pressure injury  ?Present on Admission: Yes  ?   ?Pressure Injury 05/14/21 Buttocks Stage 1 -  Intact skin with non-blanchable redness of a localized area usually over a bony prominence. non blanchable redness/discoloration to buttocks and sacral area (Active)  ?05/14/21 2000  ?Location: Buttocks (and sacral area)  ?Location Orientation:   ?Staging: Stage 1 -  Intact skin with non-blanchable redness of a localized area usually over a bony prominence.  ?Wound Description (Comments): non blanchable redness/discoloration to buttocks and sacral area  ?Present on Admission:   ? ? ?Followed by RN, try to improve nutrition status ? ?Essential hypertension ?Blood pressure stable. ? ?Aspiration pneumonia (HCC) ?Patient has completed 5 days IV antibiotics. ?Patient lung sounds worse again today, it appears that patient cannot clear his upper airway.  He still has significant saliva with drooling.  Suspect patient has constant aspiration.  Will obtain chest x-ray to confirm. ?Had a long discussion with patient daughter-in-law, patient condition may be terminal. ? ? ?  Goals of care discussion. ?Had a long discussion with patient and  family, patient condition does not seem to be improving.  His mental status has not been better, he sleeps more than 20 hours a day, still very confused.  This is secondary to brain trauma, alcohol dementia.  Patient also has severe malnutrition.  Most importantly, patient continue to have aspiration.  I will obtain repeat chest x-ray to confirm.  Based on severity of disease, patient prognosis is very poor.  I will discuss with family again tomorrow, most likely will recommend comfort care. ? ?Family has requested patient transfer to [facility]. ?Discussed with [facility], they do not accept patient at this time. ? ?Subjective:  ?Patient still very confused, he sleeps more than 20 hours a day.  He has significant drooling, could not clear upper airway. ?He is incontinent of urine and stool with small amount of loose stools each time. ? ?Physical Exam: ?Vitals:  ? 05/19/21 0236 05/19/21 0413 05/19/21 5638 05/19/21 0747  ?BP: 115/87 122/86  123/88  ?Pulse: (!) 107 100  (!) 106  ?Resp: 20 20  16   ?Temp: 99.4 ?F (37.4 ?C) (!) 100.4 ?F (38 ?C)  99.5 ?F (37.5 ?C)  ?TempSrc: Oral Oral    ?SpO2: 97% 97%  97%  ?Weight:   46.9 kg   ?Height:      ? ?General exam: Ill-appearing, cachectic, opening eyes only. ?Respiratory system: Decreased breathing sounds with some rhonchi in the base. Respiratory effort normal. ?Cardiovascular system: S1 & S2 heard, RRR. No JVD, murmurs, rubs, gallops or clicks. No pedal edema. ?Gastrointestinal system: Abdomen is nondistended, soft and nontender. No organomegaly or masses felt. Normal bowel sounds heard. ?Central nervous system: Opening eyes only, confused. ?Extremities: Significant muscle atrophy and weakness ?Skin: No rashes, lesions or ulcers ? ? ?Data Reviewed: ? ?Lab results reviewed. ? ?Family Communication: Discussed with daughter-in-law, she will convey information to the son. ? ?Disposition: ?Status is: Inpatient ?Remains inpatient appropriate because: Severity of disease.  IV treatment.  Altered  mental status ? Planned Discharge Destination:  Uncertain.  ? ? ? ?Time spent: 40 minutes ? ?Author: ? , MD ?05/19/2021 10:20 AM ? ?For on call review [email or web address].  ?

## 2021-05-19 NOTE — Progress Notes (Signed)
Chest x-ray showed a worsening bilateral lower lobe infiltrates.  Confirm my suspicion that the patient has been constantly aspirating his saliva.  Discussed with patient son and daughter-in-law, recommended for DNR and transition to comfort measures.  They will think about it tonight, we will rediscuss tomorrow morning. ?

## 2021-05-20 DIAGNOSIS — K117 Disturbances of salivary secretion: Secondary | ICD-10-CM

## 2021-05-20 DIAGNOSIS — R0600 Dyspnea, unspecified: Secondary | ICD-10-CM

## 2021-05-20 DIAGNOSIS — Z66 Do not resuscitate: Secondary | ICD-10-CM

## 2021-05-20 LAB — BLOOD GAS, ARTERIAL
Acid-Base Excess: 7.1 mmol/L — ABNORMAL HIGH (ref 0.0–2.0)
Bicarbonate: 30.1 mmol/L — ABNORMAL HIGH (ref 20.0–28.0)
O2 Content: 4 L/min
O2 Saturation: 98.1 %
Patient temperature: 37
pCO2 arterial: 36 mmHg (ref 32–48)
pH, Arterial: 7.53 — ABNORMAL HIGH (ref 7.35–7.45)
pO2, Arterial: 83 mmHg (ref 83–108)

## 2021-05-20 LAB — BASIC METABOLIC PANEL
Anion gap: 8 (ref 5–15)
BUN: 24 mg/dL — ABNORMAL HIGH (ref 8–23)
CO2: 28 mmol/L (ref 22–32)
Calcium: 8.7 mg/dL — ABNORMAL LOW (ref 8.9–10.3)
Chloride: 104 mmol/L (ref 98–111)
Creatinine, Ser: 0.96 mg/dL (ref 0.61–1.24)
GFR, Estimated: >60 mL/min (ref >60)
Glucose, Bld: 134 mg/dL — ABNORMAL HIGH (ref 70–99)
Potassium: 3.9 mmol/L (ref 3.5–5.1)
Sodium: 140 mmol/L (ref 135–145)

## 2021-05-20 LAB — CBC WITH DIFFERENTIAL/PLATELET
Abs Immature Granulocytes: 0.5 10*3/uL — ABNORMAL HIGH (ref 0.00–0.07)
Basophils Absolute: 0.1 10*3/uL (ref 0.0–0.1)
Basophils Relative: 1 %
Eosinophils Absolute: 0.1 10*3/uL (ref 0.0–0.5)
Eosinophils Relative: 1 %
HCT: 29.8 % — ABNORMAL LOW (ref 39.0–52.0)
Hemoglobin: 9.6 g/dL — ABNORMAL LOW (ref 13.0–17.0)
Immature Granulocytes: 4 %
Lymphocytes Relative: 14 %
Lymphs Abs: 1.8 10*3/uL (ref 0.7–4.0)
MCH: 32.4 pg (ref 26.0–34.0)
MCHC: 32.2 g/dL (ref 30.0–36.0)
MCV: 100.7 fL — ABNORMAL HIGH (ref 80.0–100.0)
Monocytes Absolute: 1.4 10*3/uL — ABNORMAL HIGH (ref 0.1–1.0)
Monocytes Relative: 11 %
Neutro Abs: 8.9 10*3/uL — ABNORMAL HIGH (ref 1.7–7.7)
Neutrophils Relative %: 69 %
Platelets: 410 10*3/uL — ABNORMAL HIGH (ref 150–400)
RBC: 2.96 MIL/uL — ABNORMAL LOW (ref 4.22–5.81)
RDW: 15.1 % (ref 11.5–15.5)
Smear Review: NORMAL
WBC: 12.8 10*3/uL — ABNORMAL HIGH (ref 4.0–10.5)
nRBC: 0.2 % (ref 0.0–0.2)

## 2021-05-20 LAB — MAGNESIUM: Magnesium: 2.1 mg/dL (ref 1.7–2.4)

## 2021-05-20 LAB — PHOSPHORUS: Phosphorus: 3.2 mg/dL (ref 2.5–4.6)

## 2021-05-20 LAB — GLUCOSE, CAPILLARY
Glucose-Capillary: 156 mg/dL — ABNORMAL HIGH (ref 70–99)
Glucose-Capillary: 127 mg/dL — ABNORMAL HIGH (ref 70–99)

## 2021-05-20 MED ORDER — POLYETHYLENE GLYCOL 3350 17 G PO PACK
17.0000 g | PACK | Freq: Every day | ORAL | Status: DC
Start: 2021-05-21 — End: 2021-05-20

## 2021-05-20 MED ORDER — MORPHINE SULFATE (PF) 2 MG/ML IV SOLN
1.0000 mg | INTRAVENOUS | Status: DC | PRN
  Administered 2021-05-21 – 2021-05-22 (×4): 1 mg via INTRAVENOUS
  Filled 2021-05-20 (×4): qty 1

## 2021-05-20 MED ORDER — BISACODYL 10 MG RE SUPP: 10.0000 mg | Freq: Every day | RECTAL | Status: DC | PRN

## 2021-05-20 MED ORDER — GLYCOPYRROLATE 0.2 MG/ML IJ SOLN
0.4000 mg | Freq: Four times a day (QID) | INTRAMUSCULAR | Status: DC
  Administered 2021-05-20 – 2021-05-22 (×9): 0.4 mg via INTRAVENOUS
  Filled 2021-05-20 (×11): qty 2

## 2021-05-20 MED ORDER — ACETAMINOPHEN 650 MG RE SUPP: 650.0000 mg | RECTAL | Status: DC | PRN

## 2021-05-20 MED ORDER — LORAZEPAM 2 MG/ML IJ SOLN: 1.0000 mg | INTRAMUSCULAR | Status: DC | PRN

## 2021-05-20 NOTE — Progress Notes (Signed)
?Progress Note ? ? ?Patient: Charles Schmitt. [id number]:[id number] DOB: Sep 25, 1955 DOA: 05/03/2021     17 ?DOS: the patient was seen and examined on 05/20/2021 ?  ?Brief hospital course: ?This is a 66 y/o male who was admitted on 3/25 after a fall at home with a traumatic SAH and SDH in the setting of alcohol intoxicatino. NSGY was consulted and recommended conservative management. ?On 4/3 he had a change in mental status to becoming minimally responsive.  A head CT was repeated and showed no significant change in his small right subdural hematoma and 54mm right to left midline shift.  CT scan on 4/4 showed a stable midline shift. ?Patient has significant dysphagia, had developed aspiration pneumonia with sepsis.  Treated with antibiotics. ? ? ?Assessment and Plan: ?* SAH (subarachnoid hemorrhage) (HCC) ?Conservative management per neurosurgery.  Repeated CT scan did not get worse ? ?Hypophosphatemia ?Phosphorus level has normalized. ? ?Hypernatremia ?This most likely due to intracranial hemorrhage, not from dehydration.  ?Sodium level normalized after giving a dose of metolazone ? ?Thoracic aortic aneurysm (HCC) ?No additional work-up is needed due to terminal care ? ?Hyperkalemia ?Resolved. ? 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 ? ?Acute deep vein thrombosis (DVT) of calf muscle vein of left lower extremity (HCC) ?  CT scan also showed old pulmonary emboli.  We will discontinue Eliquis after decision is made about comfort care. ? ?Sepsis (HCC) ?Due to aspiration pneumonia.  Condition improved.  Will complete 5 days of IV antibiotics. ? ?AKI (acute kidney injury) (HCC) ?Most likely ATN, renal function has improved ? ?Acute hypoxemic respiratory failure (HCC) ?Secondary to aspiration pneumonia.  Currently off oxygen with good saturation ? 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 ? ?Wernicke encephalopathy ?Repeated CT scan 4/8 did not show worsening intracranial hemorrhage. ?Patient still has significant confusion, he sleeps more than 20 hours a day.  Had a long discussion with the family, patient condition appears to be terminal. ? ? ? ?Subdural hematoma (HCC) ?Patient is managed conservatively per neurosurgery. ?Repeated head ct 4/7 did not get worse.  ? ?Protein-calorie malnutrition, severe ?Patient initial BMI was 8, increased to 12.16 after tube feeding, partially due to rehydration.  Palliative care is seeing patient, likely will transition to comfort care. ? ?Hypokalemia ?Potassium has normalized. ? ?Chronic systolic heart failure (HCC) ?Echocardiogram performed on 05/15/2021 showed ejection fraction 35 to 40%.  ?Condition stable, no evidence of exacerbation. ? ?Pressure injury of skin ?Pressure Injury 05/03/21 Sacrum healed pressure injury (Active)  ?05/03/21 2330  ?Location: Sacrum  ?Location Orientation:   ?Staging:   ?Wound Description (Comments): healed pressure injury  ?Present on Admission: Yes  ?   ?Pressure Injury 05/14/21 Buttocks Stage 1 -  Intact skin with non-blanchable redness of a localized area usually over a bony prominence. non blanchable redness/discoloration to buttocks and sacral area (Active)  ?05/14/21 2000  ?Location: Buttocks (and sacral area)  ?Location Orientation:   ?Staging: Stage 1 -  Intact skin with non-blanchable redness of a localized area usually over a bony prominence.  ?Wound Description (Comments): non blanchable redness/discoloration to buttocks and sacral area  ?Present on Admission:   ? ?Pressure ulcers of the sacrum and buttock as above. POA ? ?Followed by RN, try to improve nutrition status ? ?Essential hypertension ?Blood pressure stable. ? ?Aspiration pneumonia (HCC) ?Patient has completed 5  days IV antibiotics. ?Patient lung sounds worse again today, it appears that patient cannot clear his  upper airway.  He still has significant saliva with drooling.  Repeated chest x-ray yesterday showed worsening bilateral lower lobe infiltrates, apparently patient has been aspirating constantly.  He has finished 5 days IV antibiotics. ?Patient is unlikely to survive, most likely will transfer to comfort care today.  Change CODE STATUS to DO NOT RESUSCITATE. ? ? ? ? ? ?  ? ?Subjective:  ?Patient is awake today, but mostly he sleeps more than 20 hours a day.  He has significant drooling of saliva.  Started requiring 2 L oxygen since last night. ? ?Physical Exam: ?Vitals:  ? 05/20/21 0132 05/20/21 0329 05/20/21 0528 05/20/21 0739  ?BP: 103/74 102/74 104/75 99/78  ?Pulse: (!) 119 (!) 120 (!) 114 99  ?Resp: (!) 22 (!) 30 (!) 30 16  ?Temp: 98.2 ?F (36.8 ?C) (!) 100.7 ?F (38.2 ?C) 99.8 ?F (37.7 ?C) 97.9 ?F (36.6 ?C)  ?TempSrc: Oral Axillary Axillary   ?SpO2: 98% 96% 96% 100%  ?Weight:   46.5 kg   ?Height:      ? ?General exam: Ill-appearing, severely malnourished. ?Respiratory system: Rhonchi in the bases.  Respiratory effort normal. ?Cardiovascular system: S1 & S2 heard, RRR. No JVD, murmurs, rubs, gallops or clicks. No pedal edema. ?Gastrointestinal system: Abdomen is nondistended, soft and nontender. No organomegaly or masses felt. Normal bowel sounds heard. ?Central nervous system: Alert and oriented x1. No focal neurological deficits. ?Extremities: Significant muscle atrophy ?Skin: No rashes, lesions or ulcers ? ? ?Data Reviewed: ? ?Reviewed lab results. ? ?Family Communication: Discussed with sisters and son and daughter-in-law ? ?Disposition: ?Status is: Inpatient ?Remains inpatient appropriate because: Severity of disease, ? Planned Discharge Destination:  Likely inpatient hospice. ? ? ? ?Time spent: 45 minutes ? ?Author: ?Marrion Coy, MD ?05/20/2021 12:06 PM ? ?For on call review [email or web address].  ?

## 2021-05-20 NOTE — Progress Notes (Signed)
Chaplain Maggie prayed with patient who responded affirmatively when asked if he wanted to pray together. A prayer for mercy, thanks and awareness of God's loving kindness was shared. During prayer pt's niece joined the visit. Hospitality was offered to pt's family who appreciated the care. Continued support available per on call chaplain. ?

## 2021-05-20 NOTE — Progress Notes (Signed)
Patient ID: Charles Carneiro., male   DOB: Apr 03, 1955, 66 y.o.   MRN: [id number] ? ? ? ?Progress Note from the Palliative Medicine Team at [facility] ? ? ?Patient Name: Charles Schmitt.        ?Date: 05/20/2021 ?DOB: 09-10-1955  Age: 66 y.o. MRN#: [id number] ?Attending Physician: Sharen Hones, MD ?Primary Care Physician: Pcp, No ?Admit Date: 05/03/2021 ? ? ?Medical records reviewed  ? ? 66 y/o male who was admitted on 3/25 after a fall at home with a traumatic SAH and SDH in the setting of alcohol intoxicatino. NSGY was consulted and recommended conservative management. Since admission he has been on the hospitalist service and PT, OT and SLP have been following. He has noted to be weak and unable to perform his ADL's.  He was treated for Alcohol withdrawal with thiamine, folate, vitamins and lithium. Initially  Palliative medicine saw him to discuss goals fo care on 3/28 during which time he remained full code and was focused on being able to leave the hospital.  ? ?On 4/3 he had a change in mental status to becoming minimally responsive.  A head CT was repeated and showed no significant change in his small right subdural hematoma and 51m right to left midline shift.   ?  ?On 4/4 the patient became minimally responsive, tachycardic, tachypneic, and had soft blood pressure with associated respiratory alkalosis and lactic acidosis.  PCCM was consulted for further evaluation.   ? ?Significant Hospital events ?3/25 admission for multiple falls, traumatic SAH and SDH ?3/28 palliative care consultation: full code ?4/4 moved to the ICU for change in mental status and sepsis, left leg is swollen, mottled.  ? ?This NP visited patient at the bedside as a follow up for palliative medicine needs and emotional support.  Patient is lethargic, nonverbal and unable to follow commands.  He appears generally uncomfortable with audible throat secretions. ? ?I was able to contact patient's daughter-in-law Charles Catheyand set a  meeting up for this morning along with patient's multiple siblings and other family members. ? ?There are no documented advance care planning documents and no documented healthcare power of attorney.  Patient has 1 living son Charles Decesareand he is the patient's main decision maker at this time. ? ?I met at the bedside with Charles Ahmadiand patient's son Charles Schmitt/telephone along with multiple siblings for continued education regarding current medical situation.  Dr. ZRoosevelt Locksalso updated the family on current medical situation. ? ?Education offered on patient's overall failure to thrive and long-term poor prognosis.  Today is day 16 of this hospitalization.  Education offered on the concept of failure to thrive and the limitations of medical interventions to prolong quality of life when the body fails to thrive. ? ?Education offered on the natural trajectory and expectations at end-of-life. ? ?Patient's son is able to verbalize his decision today to shift to an intensive l comfort path focusing on quality and comfort, alleviating suffering in pain.  All family in the room support son's decision. ? ? ?Plan of Care:    Comfort quality and dignity or focus of care ?-DNR/DNI ?-No artificial feeding or hydration now or in the future/DC core track/comfort feeds as tolerated ?-No further diagnostics; labs x-rays ?-Symptom management ?      -Morphine 1 mg IV every 2 hours as needed for pain or dyspnea ?      -Ativan 1 mg IV every 4 hours as needed as needed for anxiety/agitation ?      -  Robinul 0.4 mg IV 4 times a day for terminal secretions ?-Reevaluate in 24 hours for transition of care ? ?Education offered to family on patient's fragility and that anything could happen at any time.  Prognosis is likely less than a few weeks. ? ?Questions and concerns addressed   Discussed with Dr Roosevelt Schmitt ? ? ?Wadie Lessen NP  ?Palliative Medicine Team Team Phone # [phone] ?Pager [phone] ?  ?

## 2021-05-20 NOTE — Progress Notes (Signed)
Assumed care of pt at 1900. Responding to voice overnight. No s/s of pain. Ongoing verification of NG tube placement. Rectal tube leaking at beginning of shift. Balloon re-inflated w/ good effect. Provider notified of bladder scan revealing >443 mL post void. In/out cath performed per orders. Full assessment as documented. Medication administration per MAR. Repositioning frequently. Needs anticipated. Comfort and safety maintained.  ? ?Pt w/ new oxygen requirement at approx 0400. Respiratory and provider at bedside at this time. See new orders.  ?

## 2021-05-20 NOTE — Progress Notes (Signed)
Nutrition Brief Note  Chart reviewed. Pt now transitioning to comfort care.  No further nutrition interventions planned at this time.  Please re-consult as needed.   Carrah Eppolito W, RD, LDN, CDCES Registered Dietitian II Certified Diabetes Care and Education Specialist Please refer to [facility] for RD and/or RD on-call/weekend/after hours pager   

## 2021-05-21 DIAGNOSIS — R0600 Dyspnea, unspecified: Secondary | ICD-10-CM

## 2021-05-21 DIAGNOSIS — I82462 Acute embolism and thrombosis of left calf muscular vein: Secondary | ICD-10-CM

## 2021-05-21 DIAGNOSIS — K117 Disturbances of salivary secretion: Secondary | ICD-10-CM

## 2021-05-21 DIAGNOSIS — A419 Sepsis, unspecified organism: Secondary | ICD-10-CM

## 2021-05-21 NOTE — Progress Notes (Signed)
? ? ? ?Progress Note  ? ? ?[name]  ZOX:[id number] DOB: 1955-02-19  DOA: 05/03/2021 ?PCP: Pcp, No  ? ? ? ? ?Brief Narrative:  ? ? ?Medical records reviewed and are as summarized below: ? ? ?This is a 66 y/o male who was admitted on 3/25 after a fall at home with a traumatic SAH and SDH in the setting of alcohol intoxicatino. NSGY was consulted and recommended conservative management. ?On 4/3 he had a change in mental status to becoming minimally responsive.  A head CT was repeated and showed no significant change in his small right subdural hematoma and 62mm right to left midline shift.  CT scan on 4/4 showed a stable midline shift. ?Patient has significant dysphagia, had developed aspiration pneumonia with sepsis.  Treated with antibiotics. ? ? ? ? ? ? ?Assessment/Plan:  ? ?Principal Problem: ?  SAH (subarachnoid hemorrhage) (HCC) ?Active Problems: ?  Aspiration pneumonia (HCC) ?  Essential hypertension ?  Pressure injury of skin ?  Chronic systolic heart failure (HCC) ?  Hypokalemia ?  Protein-calorie malnutrition, severe ?  Subdural hematoma (HCC) ?  Wernicke encephalopathy ?  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  Acute hypoxemic respiratory failure (HCC) ?  AKI (acute kidney injury) (HCC) ?  Sepsis (HCC) ?  Acute deep vein thrombosis (DVT) of calf muscle vein of left lower extremity (HCC) ?  Alcohol withdrawal delirium (HCC) ?  Hyperkalemia ?  Thoracic aortic aneurysm (HCC) ?  Hypernatremia ?  Hypophosphatemia ? ? ?Nutrition Problem: Severe Malnutrition ?Etiology: chronic illness (ETOH abuse) ? ?Signs/Symptoms: moderate fat depletion, severe fat depletion, moderate muscle depletion, severe muscle depletion ? ? ?Body mass index is 12.16 kg/m?. ? ? ?Subdural hematoma ?Subarachnoid hemorrhage ?Sepsis due to aspiration pneumonia ?Dysphagia ?Acute kidney injury ?Acute DVT of the left lower extremity ?Hypophosphatemia ?Hypernatremia ?Hyperkalemia ?Chronic systolic CHF with EF estimated at 35 to  40% ?Thoracic aortic aneurysm ?Severe protein calorie malnutrition ?Warnicke's encephalopathy ?Stage I decubitus ulcer on bilateral buttocks and sacral area ?Hypertension ? ? ?PLAN ? ?Continue comfort measures ?Awaiting placement to hospice house ?Plan of care was discussed with Corrie Dandy, significant other, at the bedside ? ? ?Diet Order   ? ? None  ? ?  ? ? ? ? ? ? ? ? ? ?Consultants: ?Intensivist ?Neurosurgeon ?Hospice ? ?Procedures: ?Left IJ central line on 05/13/2021 ?Intubation and mechanical ventilation on 05/13/2021 ? ? ? ?Medications:  ? ? chlorhexidine  15 mL Mouth Rinse BID  ? Chlorhexidine Gluconate Cloth  6 each Topical Daily  ? glycopyrrolate  0.4 mg Intravenous QID  ? mouth rinse  15 mL Mouth Rinse q12n4p  ? nicotine  14 mg Transdermal Daily  ? ?Continuous Infusions: ? sodium chloride Stopped (05/19/21 0249)  ? ? ? ?Anti-infectives (From admission, onward)  ? ? Start     Dose/Rate Route Frequency Ordered Stop  ? 05/16/21 1200  Ampicillin-Sulbactam (UNASYN) 3 g in sodium chloride 0.9 % 100 mL IVPB       ? 3 g ?200 mL/hr over 30 Minutes Intravenous Every 6 hours 05/16/21 1044 05/18/21 1815  ? 05/13/21 2100  ceFEPIme (MAXIPIME) 2 g in sodium chloride 0.9 % 100 mL IVPB  Status:  Discontinued       ? 2 g ?200 mL/hr over 30 Minutes Intravenous Every 12 hours 05/13/21 0210 05/13/21 0326  ? 05/13/21 2100  ceFEPIme (MAXIPIME) 2 g in sodium chloride 0.9 % 100 mL IVPB  Status:  Discontinued       ?  2 g ?200 mL/hr over 30 Minutes Intravenous Every 12 hours 05/13/21 0326 05/13/21 0845  ? 05/13/21 1500  piperacillin-tazobactam (ZOSYN) IVPB 3.375 g  Status:  Discontinued       ? 3.375 g ?12.5 mL/hr over 240 Minutes Intravenous Every 8 hours 05/13/21 0853 05/16/21 1044  ? 05/13/21 0400  ceFEPIme (MAXIPIME) 2 g in sodium chloride 0.9 % 100 mL IVPB  Status:  Discontinued       ? 2 g ?200 mL/hr over 30 Minutes Intravenous Every 12 hours 05/13/21 0106 05/13/21 0210  ? 05/13/21 0300  vancomycin (VANCOCIN) IVPB 1000 mg/200 mL  premix  Status:  Discontinued       ? 1,000 mg ?200 mL/hr over 60 Minutes Intravenous  Once 05/13/21 0205 05/13/21 0213  ? 05/13/21 0230  ceFEPIme (MAXIPIME) 2 g in sodium chloride 0.9 % 100 mL IVPB       ? 2 g ?200 mL/hr over 30 Minutes Intravenous  Once 05/13/21 0205 05/13/21 0349  ? 05/13/21 0200  vancomycin (VANCOREADY) IVPB 1500 mg/300 mL  Status:  Discontinued       ? 1,500 mg ?150 mL/hr over 120 Minutes Intravenous  Once 05/13/21 0106 05/13/21 0110  ? 05/13/21 0200  vancomycin (VANCOCIN) IVPB 1000 mg/200 mL premix  Status:  Discontinued       ? 1,000 mg ?200 mL/hr over 60 Minutes Intravenous Every 24 hours 05/13/21 0110 05/13/21 0845  ? 05/13/21 0045  metroNIDAZOLE (FLAGYL) IVPB 500 mg  Status:  Discontinued       ? 500 mg ?100 mL/hr over 60 Minutes Intravenous Every 12 hours 05/12/21 2357 05/13/21 0205  ? ?  ? ? ? ? ? ? ? ? ? ?Family Communication/Anticipated D/C date and plan/Code Status  ? ?DVT prophylaxis:  ? ? ?  Code Status: DNR ? ?Family Communication: Plan discussed with Corrie Dandy, girlfriend, at the bedside ?Disposition Plan: Plan to discharge to hospice house ? ? ?Status is: Inpatient ?Remains inpatient appropriate because: Awaiting placement to hospice house ? ? ? ? ? ? ?Subjective:  ? ?Interval events noted.  He had fever with temperature of 102.3 ?F overnight.  Patient is lethargic and unable to provide any history.  Corrie Dandy, his girlfriend, was at the bedside ? ?Objective:  ? ? ?Vitals:  ? 05/20/21 0739 05/21/21 0000 05/21/21 0327 05/21/21 0723  ?BP: 99/78  134/87 95/71  ?Pulse: 99  (!) 118 100  ?Resp: 16 (!) 30 (!) 28 16  ?Temp: 97.9 ?F (36.6 ?C)  (!) 102.3 ?F (39.1 ?C) 98.6 ?F (37 ?C)  ?TempSrc:   Axillary   ?SpO2: 100%  92% 91%  ?Weight:      ?Height:      ? ?No data found. ? ? ?Intake/Output Summary (Last 24 hours) at 05/21/2021 1715 ?Last data filed at 05/21/2021 4166 ?Gross per 24 hour  ?Intake 30 ml  ?Output 1025 ml  ?Net -995 ml  ? ?Filed Weights  ? 05/18/21 0500 05/19/21 0528 05/20/21 0528   ?Weight: 31.7 kg 46.9 kg 46.5 kg  ? ? ?Exam: ? ?GEN: NAD ?SKIN: Warm and dry ?EYES: No acute abnormality ?ENT: MMM ?CV: RRR ?PULM: CTA B ?ABD: soft, ND, +BS ?CNS: Lethargic/unresponsive ?EXT: No edema  ? ? ?Data Reviewed:  ? ?I have personally reviewed following labs and imaging studies: ? ?Labs: ?Labs show the following:  ? ?Basic Metabolic Panel: ?Recent Labs  ?Lab 05/15/21 ?0348 05/16/21 ?0630 05/17/21 ?1601 05/18/21 ?0932 05/19/21 ?3557 05/20/21 ?3220  ?NA 138 143 146* 148*  141 140  ?K 3.1* 3.0* 3.0* 3.3* 3.9 3.9  ?CL 103 108 110 112* 107 104  ?CO2 26 24 28 29 27 28   ?GLUCOSE 127* 87 89 141* 140* 134*  ?BUN 35* 28* 21 18 16  24*  ?CREATININE 1.12 1.03 0.97 0.85 0.81 0.96  ?CALCIUM 8.4* 8.7* 9.0 8.5* 8.9 8.7*  ?MG 2.6*  --  2.5* 2.3 2.1 2.1  ?PHOS 3.5  --  1.4* 2.5 2.8 3.2  ? ?GFR ?Estimated Creatinine Clearance: 51.8 mL/min (by C-G formula based on SCr of 0.96 mg/dL). ?Liver Function Tests: ?No results for input(s): AST, ALT, ALKPHOS, BILITOT, PROT, ALBUMIN in the last 168 hours. ?No results for input(s): LIPASE, AMYLASE in the last 168 hours. ?Recent Labs  ?Lab 05/17/21 ?0439  ?AMMONIA 21  ? ?Coagulation profile ?No results for input(s): INR, PROTIME in the last 168 hours. ? ?CBC: ?Recent Labs  ?Lab 05/16/21 ?[id number] 05/17/21 ?[id number] 05/18/21 ?[id number] 05/19/21 ?[id number] 05/20/21 ?[id number]  ?WBC 14.3* 11.9* 9.2 10.2 12.8*  ?NEUTROABS  --  8.5*  --   --  8.9*  ?HGB 9.4* 9.6* 8.7* 9.3* 9.6*  ?HCT 28.5* 30.1* 27.6* 28.7* 29.8*  ?MCV 100.0 101.7* 101.5* 101.8* 100.7*  ?PLT 333 338 341 353 410*  ? ?Cardiac Enzymes: ?No results for input(s): CKTOTAL, CKMB, CKMBINDEX, TROPONINI in the last 168 hours. ?BNP (last 3 results) ?No results for input(s): PROBNP in the last 8760 hours. ?CBG: ?Recent Labs  ?Lab 05/19/21 ?1609 05/19/21 ?1929 05/19/21 ?2305 05/20/21 ?0329 05/20/21 ?0740  ?GLUCAP 158* 135* 141* 156* 127*  ? ?D-Dimer: ?No results for input(s): DDIMER in the last 72 hours. ?Hgb A1c: ?No results for input(s): HGBA1C in the last 72  hours. ?Lipid Profile: ?No results for input(s): CHOL, HDL, LDLCALC, TRIG, CHOLHDL, LDLDIRECT in the last 72 hours. ?Thyroid function studies: ?No results for input(s): TSH, T4TOTAL, T3FREE, THYROIDAB in the last 72

## 2021-05-21 NOTE — Progress Notes (Addendum)
Civil engineer, contracting [facility]) Hospital Liaison Note ?  ?Received request from Transitions of Care Manager Sarahfor family interest in Hospice Home. Visited patient at bedside and spoke to DIL/Latasha/[phone] confirm interest and explain services. ?  ?Approval for Hospice Home is determined by [facility] MD. Once [facility] MD has determined Hospice Home eligibility, [facility] will update hospital staff and family. Hospice eligibility APPROVED ?  ?Please do not hesitate to call with any hospice related questions.  ?  ?Thank you for the opportunity to participate in this patient's care. ?  ?Odette Fraction, MSW ?[facility] Hospital Liaison  ?[phone] ?

## 2021-05-21 NOTE — Progress Notes (Signed)
Patient ID: Charles Schmitt., male   DOB: 1955-11-20, 66 y.o.   MRN: [id number] ? ? ? ?Progress Note from the Palliative Medicine Team at [facility] ? ? ?Patient Name: Charles Schmitt.        ?Date: 05/21/2021 ?DOB: 06/08/1955  Age: 66 y.o. MRN#: [id number] ?Attending Physician: Lurene Shadow, MD ?Primary Care Physician: Pcp, No ?Admit Date: 05/03/2021 ? ? ?Medical records reviewed  ? ?66 y/o male who was admitted on 3/25 after a fall at home with a traumatic SAH and SDH in the setting of alcohol intoxicatino. NSGY was consulted and recommended conservative management. Since admission he has been on the hospitalist service and PT, OT and SLP have been following. He has noted to be weak and unable to perform his ADL's.  He was treated for Alcohol withdrawal with thiamine, folate, vitamins and lithium. Initially  Palliative medicine saw him to discuss goals fo care on 3/28 during which time he remained full code and was focused on being able to leave the hospital.  ?  ?On 4/3 he had a change in mental status to becoming minimally responsive.  A head CT was repeated and showed no significant change in his small right subdural hematoma and 56mm right to left midline shift.   ?  ?On 4/4 the patient became minimally responsive, tachycardic, tachypneic, and had soft blood pressure with associated respiratory alkalosis and lactic acidosis.  PCCM was consulted for further evaluation.   ?  ?Significant Hospital events ?3/25 admission for multiple falls, traumatic SAH and SDH ?3/28 palliative care consultation: full code ?4/4 moved to the ICU for change in mental status and sepsis, left leg is swollen, mottled.  ? ?This NP visited patient at the bedside as a follow up to  yesterday's GOCs meeting. ? ? Patient is more lethargic today,  nonverbal and unable to follow commands.  He appears more comfortable today .  ?  ?Significant Other of 35 years at bedside/ Shernell Saldierna at bedside. ? ?I spoke to DIL/Latassha by phone, education offered  on current medical situation, and the natural trajectory at  EOL .  Family is comfortable with decision for comfort path.  ?  ? ?Plan of Care:    Comfort,  quality and dignity are focus of care at EOL ?-DNR/DNI ?-No artificial feeding or hydration now or in the future/DC core track/comfort feeds as tolerated ?-No further diagnostics; labs x-rays ?-Symptom management ?      -Morphine 1 mg IV every 2 hours as needed for pain or dyspnea ?      -Ativan 1 mg IV every 4 hours as needed as needed for anxiety/agitation ?      -Robinul 0.4 mg IV 4 times a day for terminal secretions ?-Residential hospice, family request [place] ? ? ?Questions and concerns addressed   Discussed with Dr Myriam Forehand and [facility] team  ? ? ?Lorinda Creed NP  ?Palliative Medicine Team Team Phone # [phone] ?Pager [phone] ?  ?

## 2021-05-21 NOTE — TOC Progression Note (Signed)
Transition of Care (TOC) - Progression Note  ? ? ?Patient Details  ?Name: Charles Schmitt. ?MRN: [id number] ?Date of Birth: 1956-01-08 ? ?Transition of Care (TOC) CM/SW Contact  ?Margarito Liner, LCSW ?Phone Number: ?05/21/2021, 10:40 AM ? ?Clinical Narrative:  Per palliative, family prefers hospice home in [place]. Called son and confirmed. Sent secure chat to Odette Fraction with [facility] to notify.  ? ?Expected Discharge Plan: Hospice Medical Facility ?Barriers to Discharge: Continued Medical Work up ? ?Expected Discharge Plan and Services ?Expected Discharge Plan: Hospice Medical Facility ?  ?Discharge Planning Services: CM Consult ?Post Acute Care Choice: Hospice ?Living arrangements for the past 2 months: Mobile Home ?                ?  ?  ?  ?  ?  ?  ?  ?  ?  ?  ? ? ?Social Determinants of Health (SDOH) Interventions ?  ? ?Readmission Risk Interventions ?   ? View : No data to display.  ?  ?  ?  ? ? ?

## 2021-05-22 DIAGNOSIS — F10931 Alcohol use, unspecified with withdrawal delirium: Secondary | ICD-10-CM

## 2021-05-22 DIAGNOSIS — R52 Pain, unspecified: Secondary | ICD-10-CM

## 2021-05-22 DIAGNOSIS — N179 Acute kidney failure, unspecified: Secondary | ICD-10-CM

## 2021-05-22 DIAGNOSIS — R0609 Other forms of dyspnea: Secondary | ICD-10-CM

## 2021-05-22 MED ORDER — MORPHINE SULFATE (PF) 2 MG/ML IV SOLN
2.0000 mg | INTRAVENOUS | Status: DC
  Administered 2021-05-22 (×2): 2 mg via INTRAVENOUS
  Filled 2021-05-22 (×2): qty 1

## 2021-05-22 MED ORDER — MORPHINE SULFATE (PF) 2 MG/ML IV SOLN: 2.0000 mg | INTRAVENOUS | Status: DC | PRN

## 2021-05-22 NOTE — Progress Notes (Signed)
Report given to [facility]. IV's left intact per Santiago Glad. Foley left in place. Flexiseal removed. All belongings at bedside to send to hospice.  ?

## 2021-05-22 NOTE — Progress Notes (Signed)
[facility] ([facility])  ? ?Consent forms have been completed. ? ?EMS notified of patient D/C and transport arranged. TOC/Elena and Attending Physician/Dr. Mal Misty also notified of transport arrangement.  ?  ?Please send signed DNR form with patient and RN call report to [phone].  ?  ?Daphene Calamity, MSW ?[place] ?[phone] ? ?

## 2021-05-22 NOTE — Progress Notes (Signed)
Patient ID: Charles Schmitt., male   DOB: 01-23-1956, 66 y.o.   MRN: [id number] ? ? ? ?Progress Note from the Palliative Medicine Team at [facility] ? ? ?Patient Name: Charles Minckler.        ?Date: 05/22/2021 ?DOB: October 20, 1955  Age: 66 y.o. MRN#: [id number] ?Attending Physician: Jennye Boroughs, MD ?Primary Care Physician: Pcp, No ?Admit Date: 05/03/2021 ? ? ?Medical records reviewed  ? ?66 y/o male who was admitted on 3/25 after a fall at home with a traumatic SAH and SDH in the setting of alcohol intoxicatino. NSGY was consulted and recommended conservative management. Since admission he has been on the hospitalist service and PT, OT and SLP have been following. He has noted to be weak and unable to perform his ADL's.  He was treated for Alcohol withdrawal with thiamine, folate, vitamins and lithium. Initially  Palliative medicine saw him to discuss goals fo care on 3/28 during which time he remained full code and was focused on being able to leave the hospital.  ?  ?On 4/3 he had a change in mental status to becoming minimally responsive.  A head CT was repeated and showed no significant change in his small right subdural hematoma and 59mm right to left midline shift.   ?  ?On 4/4 the patient became minimally responsive, tachycardic, tachypneic, and had soft blood pressure with associated respiratory alkalosis and lactic acidosis.   ?  ?Significant Hospital events ?3/25 admission for multiple falls, traumatic SAH and SDH ?3/28 palliative care consultation: full code ?4/4 moved to the ICU for change in mental status and sepsis, left leg is swollen, mottled.  ?05-20-2021-decision to shift to intensive comfort path focusing on dignity at end-of-life ? ?This NP visited patient at the bedside as a follow up for palliative medicine needs and emotional support. ? ? ? Patient is lethargic today,  unable to follow commands.  He is communicating that he is generally uncomfortable with pain in his legs. ?   ? ?Plan of Care:     Comfort,  quality and dignity are focus of care at EOL ?-DNR/DNI ?-No artificial feeding or hydration now or in the future/DC core track/comfort feeds as tolerated ?-No further diagnostics; labs x-rays ?-Symptom management ?      -Morphine 2 mg IV every 2 hours as needed for pain or dyspnea ?      -Morphine 2 mg IV scheduled every 4 hours for underlying generalized pain ?      -Ativan 1 mg IV every 4 hours as needed as needed for anxiety/agitation ?      -Robinul 0.4 mg IV 4 times a day for terminal secretions ?-Residential hospice, family request [place]/awaiting bed placement.   Prognosis is likely hours to days ? ? ?Questions and concerns addressed   Discussed with bedside RN ? ?PMT will continue to support holistically ? ?Wadie Lessen NP  ?Palliative Medicine Team Team Phone # [phone] ?Pager [phone] ?  ?

## 2021-05-22 NOTE — Discharge Summary (Addendum)
? ?Physician Discharge Summary  ?Charles PaceFrank Barillas Jr. ZOX:[id number] DOB: 02-23-1955 DOA: 05/03/2021 ? ?PCP: Pcp, No ? ?Admit date: 05/03/2021 ?Discharge date: 05/22/2021 ? ?Discharge disposition: Hospice house ? ? ?Recommendations for Outpatient Follow-Up:  ? ?Follow-up with hospice team at discharge ? ? ?Discharge Diagnosis:  ? ?Principal Problem: ?  SAH (subarachnoid hemorrhage) (HCC) ?Active Problems: ?  Aspiration pneumonia (HCC) ?  Essential hypertension ?  Pressure injury of skin ?  Chronic systolic heart failure (HCC) ?  Hypokalemia ?  Protein-calorie malnutrition, severe ?  Subdural hematoma (HCC) ?  Wernicke encephalopathy ?  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  Acute hypoxemic respiratory failure (HCC) ?  AKI (acute kidney injury) (HCC) ?  Sepsis (HCC) ?  Acute deep vein thrombosis (DVT) of calf muscle vein of left lower extremity (HCC) ?  Alcohol withdrawal delirium (HCC) ?  Hyperkalemia ?  Thoracic aortic aneurysm (HCC) ?  Hypernatremia ?  Hypophosphatemia ? ? ? ?Discharge Condition: Stable. ? ?Diet recommendation:  ?Diet Order   ? ? None  ? ?  ? ? ?  Code Status: DNR ? ? ? ? ?Hospital Course:  ? ?[name] is a 66 y/o male with medical history significant for hypertension, hyperlipidemia, chronic diastolic CHF, alcohol use disorder, tobacco use disorder, thoracic aortic aneurysm, severe protein calorie malnutrition, who was admitted on 3/25 after a fall at home with a traumatic SAH and SDH in the setting of alcohol intoxication.  Neurosurgeon was consulted and conservative management was recommended. ? ?On 4/3 he had a change in mental status to becoming minimally responsive.  A head CT was repeated and showed no significant change in his small right subdural hematoma and 5mm right to left midline shift.  CT scan on 4/4 showed a stable midline shift. ?Patient has significant dysphagia, had developed aspiration pneumonia with septic shock.  He developed acute hypoxic respiratory  failure requiring intubation and mechanical ventilation.  He was treated with vasopressors, IV antibiotics and IV fluids.  He also had acute kidney injury and electrolyte abnormalities (hypophosphatemia, hypernatremia and hyperkalemia) that were treated. ? ?He was also found to have acute left lower extremity DVT.  He was treated with IV heparin drip.  He was successfully extubated.  Patient and family opted for comfort measures with hospice so he was transitioned to hospice care.  He was evaluated by the hospice team and he was deemed to be a candidate for discharge to hospice home.  He is more awake and alert today.  He asked whether he could have a cigarette earlier today.  He will be discharged to the hospice home today.  Discharge plan was discussed with patient and his 2 sisters Charles Schmitt and Charles Schmitt) at the bedside earlier in the day. ?  ?  ? ? ? ?Medical Consultants:  ? ?Intensivist ?Neurosurgeon ?Palliative care team and hospice team ? ? ?Discharge Exam:  ? ? ?Vitals:  ? 05/20/21 0739 05/21/21 0000 05/21/21 0327 05/21/21 0723  ?BP: 99/78  134/87 95/71  ?Pulse: 99  (!) 118 100  ?Resp: 16 (!) 30 (!) 28 16  ?Temp: 97.9 ?F (36.6 ?C)  (!) 102.3 ?F (39.1 ?C) 98.6 ?F (37 ?C)  ?TempSrc:   Axillary   ?SpO2: 100%  92% 91%  ?Weight:      ?Height:      ? ? ? ?GEN: NAD ?SKIN: Warm and dry ?EYES: No pallor or icterus ?ENT: MMM ?CV: RRR ?PULM: CTA B ?ABD: soft, ND, NT, +BS ?CNS:  More awake and alert. ?EXT: Bilateral pedal edema ? ? ?The results of significant diagnostics from this hospitalization (including imaging, microbiology, ancillary and laboratory) are listed below for reference.   ? ? ?Procedures and Diagnostic Studies:  ? ?DG Abd 1 View ? ?Result Date: 05/16/2021 ?CLINICAL DATA:  Unable to flush Dobbhoff. EXAM: ABDOMEN - 1 VIEW COMPARISON:  05/16/2021 FINDINGS: Dobbhoff tube terminates in the region of the gastro duodenal junction. It is kinked at the distal and. No dilated loops of bowel to indicate ileus or  obstruction. IMPRESSION: 1. Dobbhoff tube terminates in the region of the gastro duodenal junction. It is kinked at the distal and. Electronically Signed   By: Acquanetta Belling M.D.   On: 05/16/2021 16:26  ? ?DG Abd 1 View ? ?Result Date: 05/16/2021 ?CLINICAL DATA:  Dobbhoff placement. EXAM: ABDOMEN - 1 VIEW COMPARISON:  05/15/2021 FINDINGS: Dobbhoff feeding tube enters the stomach with tip over the left upper quadrant in the gastric fundus. Tip was previously over the proximal duodenum in the right upper quadrant. Bowel gas pattern is nonobstructive. No free peritoneal air. Remainder the exam is unchanged. IMPRESSION: Dobbhoff feeding tube now has tip over the left upper quadrant in the region of the gastric fundus. Electronically Signed   By: Elberta Fortis M.D.   On: 05/16/2021 09:48  ? ?CT HEAD WO CONTRAST ( ) ? ?Result Date: 05/16/2021 ?CLINICAL DATA:  Subdural hematoma EXAM: CT HEAD WITHOUT CONTRAST TECHNIQUE: Contiguous axial images were obtained from the base of the skull through the vertex without intravenous contrast. RADIATION DOSE REDUCTION: This exam was performed according to the departmental dose-optimization program which includes automated exposure control, adjustment of the mA and/or kV according to patient size and/or use of iterative reconstruction technique. COMPARISON:  Head CT 05/12/2021 FINDINGS: Brain: Unchanged right cerebral convexity subdural hematoma, measuring up to 1.1 cm. Unchanged 5 mm leftward midline shift. The basal cisterns remain patent. There is unchanged minimal effacement of the right lateral ventricle. Mildly increased edema in the frontal lobes likely due to evolving changes of bifrontal contusion. No new hemorrhage. Vascular: No hyperdense vessel. Skull: Negative for skull fracture. Sinuses/Orbits: The orbits are unremarkable. The paranasal sinuses are predominantly clear. Mastoid air cells are clear. Other: None IMPRESSION: Stable right-sided subdural hematoma with 5 mm leftward  midline shift. Patent basal cisterns. Mildly increased bifrontal lobe edema likely reflecting evolving bifrontal contusions, attention on follow-up recommend. No new hemorrhage. Electronically Signed   By: Caprice Renshaw M.D.   On: 05/16/2021 15:17  ? ?DG Chest Port 1 View ? ?Result Date: 05/16/2021 ?CLINICAL DATA:  Dobbhoff EXAM: PORTABLE CHEST 1 VIEW COMPARISON:  05/13/2021 FINDINGS: Weighted enteric feeding tube is positioned with tip and side port below the diaphragm, tip doubled over near the gastroesophageal junction. Metallic stylette remains in position. The heart size and mediastinal contours are within normal limits. Both lungs are clear. The visualized skeletal structures are unremarkable. IMPRESSION: Weighted enteric feeding tube is positioned with tip and side port below the diaphragm, tip doubled over near the gastroesophageal junction. Metallic stylette remains in position. Electronically Signed   By: Jearld Lesch M.D.   On: 05/16/2021 08:55  ? ?DG Abd Portable 1V ? ?Result Date: 05/16/2021 ?CLINICAL DATA:  Nasogastric tube adjustment. EXAM: PORTABLE ABDOMEN - 1 VIEW COMPARISON:  Same day. FINDINGS: Feeding tube appears to be looped within stomach with distal tip in proximal stomach. IMPRESSION: Feeding tube tip is seen in expected position of proximal stomach. Electronically Signed   By: Fayrene Fearing  Christen Butter M.D.   On: 05/16/2021 13:45  ? ?DG Abd Portable 1V ? ?Result Date: 05/15/2021 ?CLINICAL DATA:  Feeding tube placement. EXAM: PORTABLE ABDOMEN - 1 VIEW COMPARISON:  May 13, 2021. FINDINGS: Distal tip of feeding tube is seen in expected position of second portion of duodenum. IMPRESSION: Distal tip of feeding tube seen in expected position of second portion of duodenum. Electronically Signed   By: Lupita Raider M.D.   On: 05/15/2021 10:58  ? ?DG Naso/Oro Gtube Thru Duo-Reposition ? ?Result Date: 05/16/2021 ?INDICATION: Malnutrition, recurrent aspiration. Dobhoff advanced earlier today under fluoroscopy now  found to be kinked at GE junction and unable to be flushed. Request for reposition. EXAM: IR NASO/ORO GTUBE THRU DUO - REPOSITION COMPARISON:  Same day radiograph CONTRAST:  10 cc Omnipaque 300 FLUOROSCOPY TIME:

## 2021-05-22 NOTE — TOC Progression Note (Signed)
Transition of Care (TOC) - Progression Note  ? ? ?Patient Details  ?Name: Charles Schmitt. ?MRN: [id number] ?Date of Birth: Mar 06, 1955 ? ?Transition of Care (TOC) CM/SW Contact  ?Pete Pelt, RN ?Phone Number: ?05/22/2021, 2:21 PM ? ?Clinical Narrative:   Discharge to Hospice home today.per Lorayne Bender.  [facility] to call ems ? ? ? ?Expected Discharge Plan: [place] ?Barriers to Discharge: Continued Medical Work up ? ?Expected Discharge Plan and Services ?Expected Discharge Plan: [place] ?  ?Discharge Planning Services: CM Consult ?Post Acute Care Choice: Hospice ?Living arrangements for the past 2 months: Mobile Home ?Expected Discharge Date: 05/22/21               ?  ?  ?  ?  ?  ?  ?  ?  ?  ?  ? ? ?Social Determinants of Health (SDOH) Interventions ?  ? ?Readmission Risk Interventions ?   ? View : No data to display.  ?  ?  ?  ? ? ?

## 2021-05-22 NOTE — Progress Notes (Signed)
DD made aware of multiple concerns with who the patient refers to as his "baby sister, Rosey Bath," being verbally aggressive to the staff and students. Morrie Sheldon, CN set expectations that if behavior continued to occur, she would be asked to leave. Security accompanied DD to ask sister to leave due to continued behaviors. DD told sister that she could return tomorrow and if concerns continued she would be asked to leave and not allowed to return. Sister escorted out of facility with security. Discussed the above with patient. Patient apologized on his sisters behalf. Primary RN and [facility] made aware. Perlie Mayo, RN ?  ?

## 2021-05-22 NOTE — Plan of Care (Signed)
?  Problem: Education: ?Goal: Knowledge of General Education information will improve ?Description: Including pain rating scale, medication(s)/side effects and non-pharmacologic comfort measures ?Outcome: Completed/Met ?  ?Problem: Health Behavior/Discharge Planning: ?Goal: Ability to manage health-related needs will improve ?Outcome: Completed/Met ?  ?Problem: Clinical Measurements: ?Goal: Ability to maintain clinical measurements within normal limits will improve ?Outcome: Completed/Met ?Goal: Will remain free from infection ?Outcome: Completed/Met ?Goal: Diagnostic test results will improve ?Outcome: Completed/Met ?Goal: Respiratory complications will improve ?Outcome: Completed/Met ?Goal: Cardiovascular complication will be avoided ?Outcome: Completed/Met ?  ?Problem: Activity: ?Goal: Risk for activity intolerance will decrease ?Outcome: Completed/Met ?  ?Problem: Nutrition: ?Goal: Adequate nutrition will be maintained ?Outcome: Completed/Met ?  ?Problem: Coping: ?Goal: Level of anxiety will decrease ?Outcome: Completed/Met ?  ?Problem: Elimination: ?Goal: Will not experience complications related to bowel motility ?Outcome: Completed/Met ?Goal: Will not experience complications related to urinary retention ?Outcome: Completed/Met ?  ?Problem: Pain Managment: ?Goal: General experience of comfort will improve ?Outcome: Completed/Met ?  ?Problem: Safety: ?Goal: Ability to remain free from injury will improve ?Outcome: Completed/Met ?  ?Problem: Skin Integrity: ?Goal: Risk for impaired skin integrity will decrease ?Outcome: Completed/Met ?  ?Problem: Education: ?Goal: Knowledge of secondary prevention will improve (SELECT ALL) ?Outcome: Completed/Met ?  ?

## 2021-05-23 ENCOUNTER — Other Ambulatory Visit: Payer: Self-pay

## 2021-06-09 DEATH — deceased

## 2021-08-01 ENCOUNTER — Other Ambulatory Visit: Payer: Self-pay

## 2023-04-06 IMAGING — DX DG CHEST 1V PORT
1 series · 1 of 1 positions shown · non-contrast
Comparison: None.

CLINICAL DATA: Weakness

EXAM:
PORTABLE CHEST 1 VIEW

[chest ap]
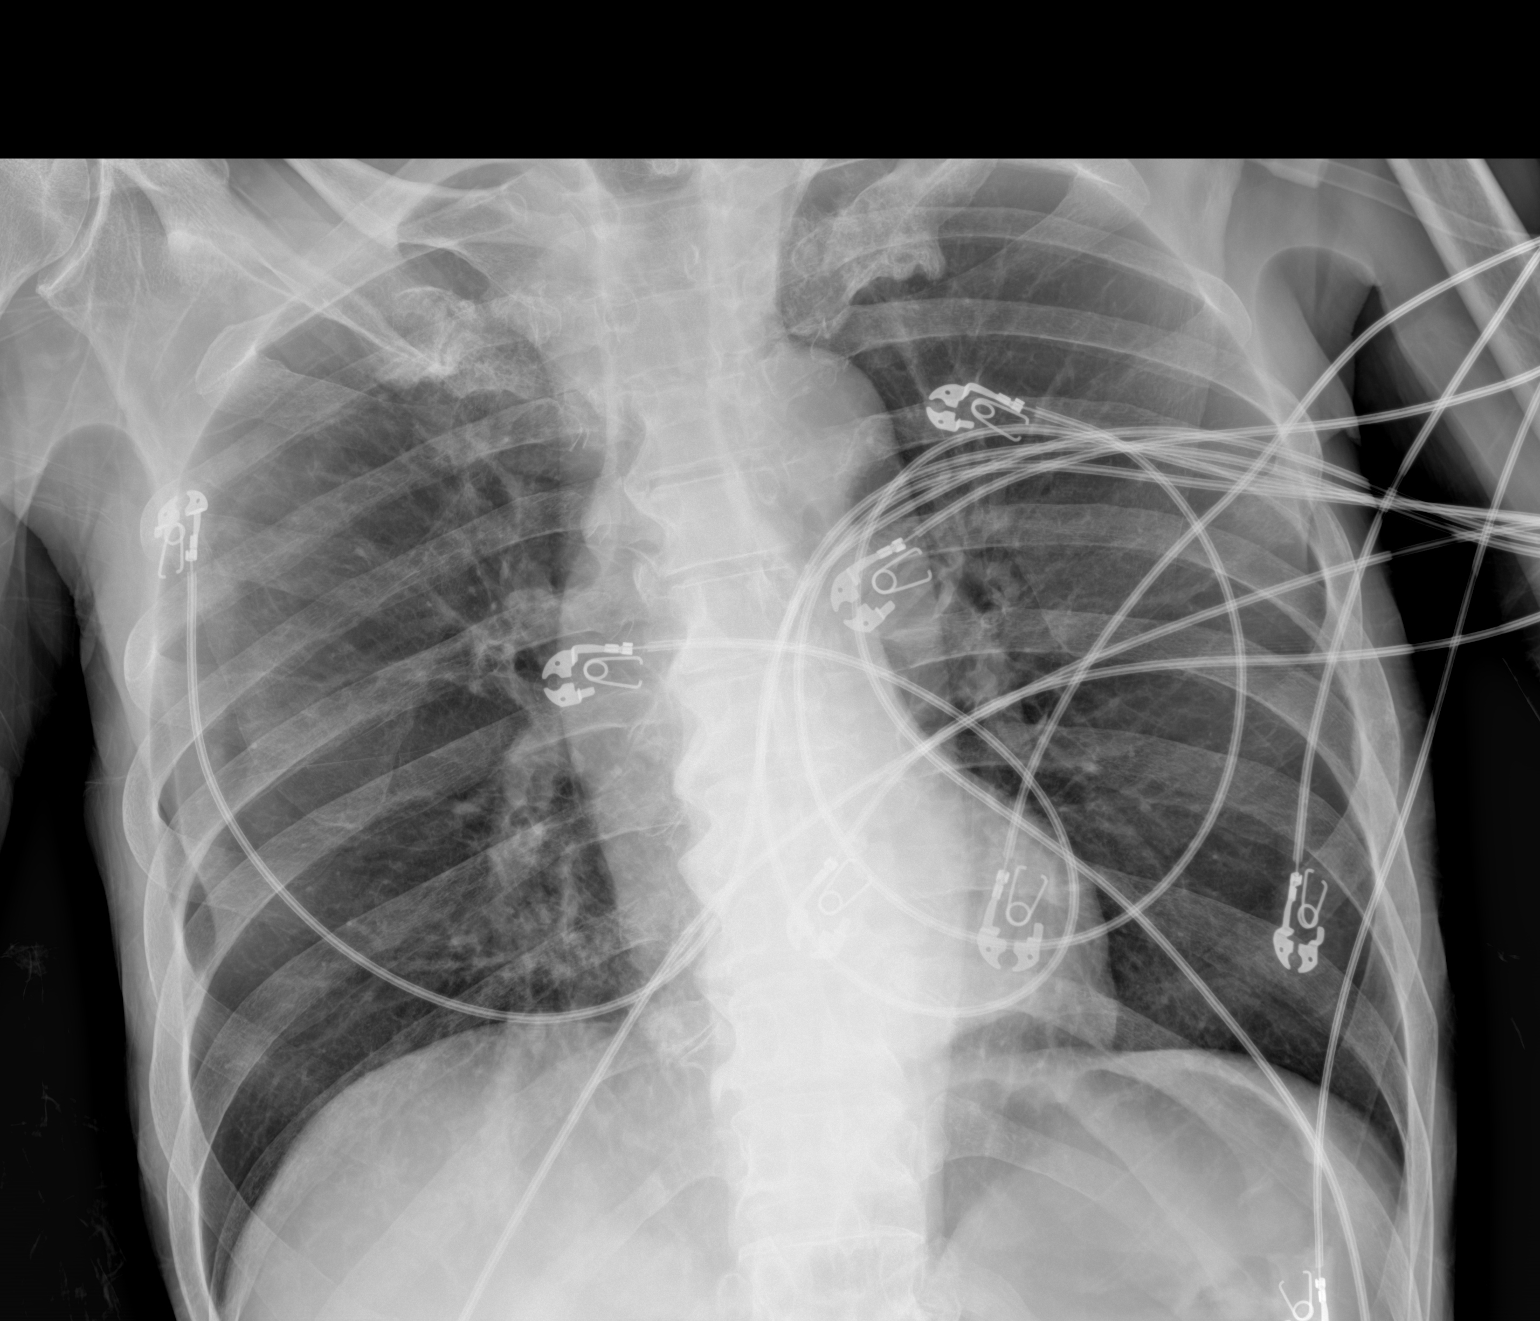

[1 of 1 positions shown; findings below may reference images not displayed]

FINDINGS: The cardiomediastinal silhouette is normal.

There is no focal consolidation or pulmonary edema. There is no
pleural effusion or pneumothorax.

There is no acute osseous abnormality.
IMPRESSION: No radiographic evidence of acute cardiopulmonary process.
